# Patient Record
Sex: Female | Born: 1985 | Race: Black or African American | Hispanic: No | Marital: Single | Smoking: Never smoker
Health system: Southern US, Community
[De-identification: ages and names within clinical notes are randomized; demographics above are authoritative.]

## PROBLEM LIST (undated history)

## (undated) DIAGNOSIS — R7303 Prediabetes: Secondary | ICD-10-CM

## (undated) DIAGNOSIS — E282 Polycystic ovarian syndrome: Secondary | ICD-10-CM

## (undated) DIAGNOSIS — E119 Type 2 diabetes mellitus without complications: Secondary | ICD-10-CM

## (undated) DIAGNOSIS — O24419 Gestational diabetes mellitus in pregnancy, unspecified control: Secondary | ICD-10-CM

## (undated) DIAGNOSIS — N912 Amenorrhea, unspecified: Secondary | ICD-10-CM

## (undated) DIAGNOSIS — I10 Essential (primary) hypertension: Secondary | ICD-10-CM

## (undated) HISTORY — PX: WISDOM TOOTH EXTRACTION: SHX21

## (undated) HISTORY — DX: Prediabetes: R73.03

## (undated) HISTORY — DX: Polycystic ovarian syndrome: E28.2

---

## 2017-07-16 ENCOUNTER — Encounter (HOSPITAL_COMMUNITY): Payer: Self-pay | Admitting: Emergency Medicine

## 2017-07-16 ENCOUNTER — Ambulatory Visit (HOSPITAL_COMMUNITY)
Admission: EM | Admit: 2017-07-16 | Discharge: 2017-07-16 | Disposition: A | Discharge status: Home / Self Care | Payer: BLUE CROSS/BLUE SHIELD | Attending: Family Medicine | Admitting: Family Medicine

## 2017-07-16 DIAGNOSIS — J40 Bronchitis, not specified as acute or chronic: Secondary | ICD-10-CM | POA: Diagnosis not present

## 2017-07-16 DIAGNOSIS — J0140 Acute pansinusitis, unspecified: Secondary | ICD-10-CM

## 2017-07-16 HISTORY — DX: Essential (primary) hypertension: I10

## 2017-07-16 HISTORY — DX: Type 2 diabetes mellitus without complications: E11.9

## 2017-07-16 HISTORY — DX: Amenorrhea, unspecified: N91.2

## 2017-07-16 MED ORDER — BENZONATATE 100 MG PO CAPS: 100.0000 mg | ORAL_CAPSULE | Freq: Three times a day (TID) | ORAL | 0 refills | Status: DC | PRN

## 2017-07-16 MED ORDER — AMOXICILLIN-POT CLAVULANATE 875-125 MG PO TABS: 1.0000 | ORAL_TABLET | Freq: Two times a day (BID) | ORAL | 0 refills | Status: AC

## 2017-07-16 NOTE — ED Provider Notes (Signed)
[facility]    CSN: [id number] Arrival date & time: 07/16/17  1123     History   Chief Complaint Chief Complaint  Patient presents with  . URI    HPI Melissa Fleming is a 31 y.o. female.   31 year old female presents with nasal and chest congestion and cough for over 10 days. Also experiencing chills, sore throat and nausea but denies any fever, vomiting or diarrhea. Last evening, she was having more chest congestion and soreness and coughing up green mucus. Has taken Dayquil/Nyquil and Ibuprofen with minimal relief. No other family members ill. Has history of HTN and Diabetes- on Lisinopril, Aldactone and Metformin daily.    The history is provided by the patient.    Past Medical History:  Diagnosis Date  . Amenorrhea   . Diabetes mellitus without complication (HCC)   . Hypertension     There are no active problems to display for this patient.   History reviewed. No pertinent surgical history.  OB History    No data available       Home Medications    Prior to Admission medications   Medication Sig Start Date End Date Taking? Authorizing Provider  LISINOPRIL PO Take by mouth.   Yes [provider]  metFORMIN (GLUCOPHAGE) 500 MG tablet Take by mouth 2 (two) times daily with a meal.   Yes [provider]  spironolactone (ALDACTONE) 25 MG tablet Take 25 mg by mouth daily.   Yes [provider]  amoxicillin-clavulanate (AUGMENTIN) 875-125 MG tablet Take 1 tablet by mouth every 12 (twelve) hours. 07/16/17 07/23/17  Sudie Grumbling, NP  benzonatate (TESSALON) 100 MG capsule Take 1 capsule (100 mg total) by mouth 3 (three) times daily as needed for cough. 07/16/17   Sudie Grumbling, NP    Family History No family history on file.  Social History Social History  Substance Use Topics  . Smoking status: Never Smoker  . Smokeless tobacco: Not on file  . Alcohol use Yes     Allergies   Patient has no known  allergies.   Review of Systems Review of Systems  Constitutional: Positive for chills and fatigue. Negative for appetite change and fever.  HENT: Positive for congestion, postnasal drip, rhinorrhea, sinus pain, sinus pressure and sore throat. Negative for ear discharge, ear pain, mouth sores, nosebleeds, sneezing and trouble swallowing.   Eyes: Negative for discharge, redness and itching.  Respiratory: Positive for cough and chest tightness. Negative for shortness of breath and wheezing.   Gastrointestinal: Positive for nausea. Negative for abdominal pain, diarrhea and vomiting.  Musculoskeletal: Positive for arthralgias and myalgias. Negative for neck pain and neck stiffness.  Skin: Negative for rash and wound.  Allergic/Immunologic: Negative for immunocompromised state.  Neurological: Positive for headaches. Negative for dizziness, tremors, seizures, syncope, weakness, light-headedness and numbness.  Hematological: Negative for adenopathy. Does not bruise/bleed easily.     Physical Exam Triage Vital Signs ED Triage Vitals  Enc Vitals Group     BP 07/16/17 1139 (!) 141/83     Pulse Rate 07/16/17 1139 88     Resp 07/16/17 1139 16     Temp 07/16/17 1139 98.2 F (36.8 C)     Temp Source 07/16/17 1139 Oral     SpO2 07/16/17 1139 98 %     Weight 07/16/17 1140 200 lb (90.7 kg)     Height 07/16/17 1140  (1.626 m)     Head Circumference --  Peak Flow --      Pain Score 07/16/17 1143 6     Pain Loc --      Pain Edu? --      Excl. in GC? --    No data found.   Updated Vital Signs BP (!) 141/83 (BP Location: Right Arm)   Pulse 88   Temp 98.2 F (36.8 C) (Oral)   Resp 16   Ht  (1.626 m)   Wt 200 lb (90.7 kg)   SpO2 98%   BMI 34.33 kg/m   Visual Acuity Right Eye Distance:   Left Eye Distance:   Bilateral Distance:    Right Eye Near:   Left Eye Near:    Bilateral Near:     Physical Exam  Constitutional: She is oriented to person, place, and time. She  appears well-developed and well-nourished. She appears ill. No distress.  HENT:  Head: Normocephalic and atraumatic.  Right Ear: Hearing, tympanic membrane, external ear and ear canal normal.  Left Ear: Hearing, tympanic membrane, external ear and ear canal normal.  Nose: Mucosal edema and rhinorrhea present. Right sinus exhibits maxillary sinus tenderness and frontal sinus tenderness. Left sinus exhibits maxillary sinus tenderness and frontal sinus tenderness.  Mouth/Throat: Uvula is midline and mucous membranes are normal. Posterior oropharyngeal erythema present.  Neck: Normal range of motion. Neck supple.  Cardiovascular: Normal rate, regular rhythm and normal heart sounds.   No murmur heard. Pulmonary/Chest: Effort normal. No respiratory distress. She has decreased breath sounds in the right upper field and the left upper field. She has no wheezes. She has rhonchi in the right upper field and the left upper field.  Musculoskeletal: Normal range of motion.  Lymphadenopathy:    She has no cervical adenopathy.  Neurological: She is alert and oriented to person, place, and time.  Skin: Skin is warm and dry. No rash noted.  Psychiatric: She has a normal mood and affect. Her behavior is normal. Judgment and thought content normal.     UC Treatments / Results  Labs (all labs ordered are listed, but only abnormal results are displayed) Labs Reviewed - No data to display  EKG  EKG Interpretation None       Radiology No results found.  Procedures Procedures (including critical care time)  Medications Ordered in UC Medications - No data to display   Initial Impression / Assessment and Plan / UC Course  I have reviewed the triage vital signs and the nursing notes.  Pertinent labs & imaging results that were available during my care of the patient were reviewed by me and considered in my medical decision making (see chart for details).    Discussed with patient that she  probably has a sinus infection and mild bronchitis. Recommend start Augmentin  twice a day as directed- take with food. Recommend use Tessalon cough pills - take 1 every 8 hours as needed. Increase fluid intake to help loosen up mucus. May take Ibuprofen  every 6 hours as needed for pain. Note written for work. Rest. Follow-up in 4 to 5 days if not improving.    Final Clinical Impressions(s) / UC Diagnoses   Final diagnoses:  Acute non-recurrent pansinusitis  Bronchitis    New Prescriptions Discharge Medication List as of 07/16/2017 12:06 PM    START taking these medications   Details  amoxicillin-clavulanate (AUGMENTIN) 875-125 MG tablet Take 1 tablet by mouth every 12 (twelve) hours., Starting Thu 07/16/2017, Until Thu 07/23/2017, Normal    benzonatate (  TESSALON) 100 MG capsule Take 1 capsule (100 mg total) by mouth 3 (three) times daily as needed for cough., Starting Thu 07/16/2017, Normal         Controlled Substance Prescriptions [place] Controlled Substance Registry consulted? Not Applicable   Sudie Grumbling, NP 07/16/17 1644

## 2017-07-16 NOTE — Discharge Instructions (Addendum)
Recommend start Augmentin  twice a day as directed- take with food. Recommend use Tessalon cough pills - take 1 every 8 hours as needed. Increase fluid intake to help loosen up mucus. May take Ibuprofen  every 6 hours as needed for pain. Follow-up in 4 to 5 days if not improving.

## 2017-07-16 NOTE — ED Triage Notes (Signed)
Cold symptoms for a week and a half.  Head congestion, chest congestion has worsened and chest soreness worsened last night .  Generalized aches.  Reports intermittent thick green mucus

## 2017-11-01 ENCOUNTER — Other Ambulatory Visit: Payer: Self-pay

## 2017-11-01 ENCOUNTER — Encounter (HOSPITAL_COMMUNITY): Payer: Self-pay | Admitting: *Deleted

## 2017-11-01 ENCOUNTER — Ambulatory Visit (HOSPITAL_COMMUNITY)
Admission: EM | Admit: 2017-11-01 | Discharge: 2017-11-01 | Disposition: A | Discharge status: Home / Self Care | Payer: BLUE CROSS/BLUE SHIELD | Attending: Family Medicine | Admitting: Family Medicine

## 2017-11-01 DIAGNOSIS — J22 Unspecified acute lower respiratory infection: Secondary | ICD-10-CM

## 2017-11-01 MED ORDER — RANITIDINE HCL 150 MG PO CAPS: 150.0000 mg | ORAL_CAPSULE | Freq: Every day | ORAL | 0 refills | Status: DC

## 2017-11-01 MED ORDER — AZITHROMYCIN 250 MG PO TABS: ORAL_TABLET | ORAL | 0 refills | Status: DC

## 2017-11-01 MED ORDER — ACETAMINOPHEN 325 MG PO TABS
ORAL_TABLET | ORAL | Status: AC
  Filled 2017-11-01: qty 2

## 2017-11-01 MED ORDER — ACETAMINOPHEN 325 MG PO TABS
650.0000 mg | ORAL_TABLET | Freq: Once | ORAL | Status: AC
  Administered 2017-11-01: 650 mg via ORAL

## 2017-11-01 MED ORDER — PREDNISONE 20 MG PO TABS: 40.0000 mg | ORAL_TABLET | Freq: Every day | ORAL | 0 refills | Status: AC

## 2017-11-01 NOTE — ED Provider Notes (Signed)
[facility]    CSN: [id number] Arrival date & time: 11/01/17  1702     History   Chief Complaint Chief Complaint  Patient presents with  . Cough    HPI Melissa Fleming is a 32 y.o. female.   [name] presents with complaints of cough for the past week which worsened over the past two days. Hoarse voice. Body aches, headache, mildly short of breath. Burning to the chest which is worse with eating. Productive cough. Sore throat. Temps 99 range. Without ear pain. Denies gi/gu complaints. 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 Taking cough/congestion symptoms, dayquil and nyquil which have not helped. Without asthma history, does not smoke. History of diabetes type 2, hypertension.    ROS per HPI.       Past Medical History:  Diagnosis Date  . Amenorrhea   . Diabetes mellitus without complication (HCC)   . Hypertension     There are no active problems to display for this patient.   History reviewed. No pertinent surgical history.  OB History    No data available       Home Medications    Prior to Admission medications   Medication Sig Start Date End Date Taking? Authorizing Provider  LISINOPRIL PO Take by mouth.   Yes [provider]  metFORMIN (GLUCOPHAGE) 500 MG tablet Take by mouth 2 (two) times daily with a meal.   Yes [provider]  spironolactone (ALDACTONE) 25 MG tablet Take 25 mg by mouth daily.   Yes [provider]  azithromycin (ZITHROMAX) 250 MG tablet Take first 2 tablets together, then 1 every day until finished. 11/01/17   Georgetta Haber, NP  benzonatate (TESSALON) 100 MG capsule Take 1 capsule (100 mg total) by mouth 3 (three) times daily as needed for cough. 07/16/17   Sudie Grumbling, NP  predniSONE (DELTASONE) 20 MG tablet Take 2 tablets (40 mg total) by mouth daily with breakfast for 5 days. 11/01/17 11/06/17  Georgetta Haber, NP  ranitidine (ZANTAC) 150 MG capsule Take 1 capsule (150 mg total)  by mouth daily. 11/01/17   Georgetta Haber, NP    Family History No family history on file.  Social History Social History   Tobacco Use  . Smoking status: Never Smoker  . Smokeless tobacco: Never Used  Substance Use Topics  . Alcohol use: Yes    Comment: occasionally  . Drug use: No     Allergies   Patient has no known allergies.   Review of Systems Review of Systems   Physical Exam Triage Vital Signs ED Triage Vitals  Enc Vitals Group     BP 11/01/17 1755 (!) 149/103     Pulse Rate 11/01/17 1755 82     Resp 11/01/17 1755 20     Temp 11/01/17 1755 98.3 F (36.8 C)     Temp Source 11/01/17 1755 Oral     SpO2 11/01/17 1755 99 %     Weight --      Height --      Head Circumference --      Peak Flow --      Pain Score 11/01/17 1757 8     Pain Loc --      Pain Edu? --      Excl. in GC? --    No data found.  Updated Vital Signs BP (!) 149/103   Pulse 82   Temp 98.3 F (36.8 C) (Oral)  Resp 20   SpO2 99%   Visual Acuity Right Eye Distance:   Left Eye Distance:   Bilateral Distance:    Right Eye Near:   Left Eye Near:    Bilateral Near:     Physical Exam  Constitutional: She is oriented to person, place, and time. She appears well-developed and well-nourished. No distress.  HENT:  Head: Normocephalic and atraumatic.  Right Ear: Tympanic membrane, external ear and ear canal normal.  Left Ear: Tympanic membrane, external ear and ear canal normal.  Nose: Nose normal.  Mouth/Throat: Uvula is midline, oropharynx is clear and moist and mucous membranes are normal. No tonsillar exudate.  Eyes: Conjunctivae and EOM are normal. Pupils are equal, round, and reactive to light.  Cardiovascular: Normal rate, regular rhythm and normal heart sounds.  Pulmonary/Chest: Effort normal. She has decreased breath sounds.  Abdominal: Soft. There is no tenderness.  Neurological: She is alert and oriented to person, place, and time.  Skin: Skin is warm and dry.      UC Treatments / Results  Labs (all labs ordered are listed, but only abnormal results are displayed) Labs Reviewed - No data to display  EKG  EKG Interpretation None       Radiology No results found.  Procedures Procedures (including critical care time)  Medications Ordered in UC Medications  acetaminophen (TYLENOL) tablet 650 mg (650 mg Oral Given 11/01/17 1803)     Initial Impression / Assessment and Plan / UC Course  I have reviewed the triage vital signs and the nursing notes.  Pertinent labs & imaging results that were available during my care of the patient were reviewed by me and considered in my medical decision making (see chart for details).     1 week of symptoms, worsening cough. zpack initiated. Imaging deferred at this time. Afebrile, without tachypnea, tachycardia or hypoxia. Complete course of antibiotics. 5 days of 50mg  prednisone. Zantac for burning with eating. If symptoms worsen or do not improve in the next week to return to be seen or to follow up with PCP.  Patient verbalized understanding and agreeable to plan.    Final Clinical Impressions(s) / UC Diagnoses   Final diagnoses:  Lower respiratory infection    ED Discharge Orders        Ordered    azithromycin (ZITHROMAX) 250 MG tablet     11/01/17 1827    predniSONE (DELTASONE) 20 MG tablet  Daily with breakfast     11/01/17 1827    ranitidine (ZANTAC) 150 MG capsule  Daily     11/01/17 1827       Controlled Substance Prescriptions [place] Controlled Substance Registry consulted? Not Applicable   Georgetta HaberBurky, Shatavia Santor B, NP 11/01/17 [phone]

## 2017-11-01 NOTE — Discharge Instructions (Signed)
Push fluids to ensure adequate hydration and keep secretions thin.  5 days of prednisone, please monitor your blood sugars.  Daily zantac to help with heartburn.  Tylenol and/or ibuprofen as needed for pain or fevers.  If symptoms worsen or do not improve in the next week to return to be seen or to follow up with PCP.

## 2017-11-01 NOTE — ED Triage Notes (Signed)
C/O cough x 1 wk.  Now having generalized body aches, feeling feverish with temps in 99 range.

## 2017-11-03 ENCOUNTER — Ambulatory Visit (HOSPITAL_COMMUNITY)
Admission: EM | Admit: 2017-11-03 | Discharge: 2017-11-03 | Disposition: A | Discharge status: Home / Self Care | Payer: BLUE CROSS/BLUE SHIELD | Attending: Family Medicine | Admitting: Family Medicine

## 2017-11-03 ENCOUNTER — Ambulatory Visit (INDEPENDENT_AMBULATORY_CARE_PROVIDER_SITE_OTHER): Payer: BLUE CROSS/BLUE SHIELD

## 2017-11-03 ENCOUNTER — Encounter (HOSPITAL_COMMUNITY): Payer: Self-pay | Admitting: Emergency Medicine

## 2017-11-03 DIAGNOSIS — Z3202 Encounter for pregnancy test, result negative: Secondary | ICD-10-CM | POA: Diagnosis not present

## 2017-11-03 DIAGNOSIS — J22 Unspecified acute lower respiratory infection: Secondary | ICD-10-CM

## 2017-11-03 DIAGNOSIS — R05 Cough: Secondary | ICD-10-CM | POA: Diagnosis not present

## 2017-11-03 DIAGNOSIS — R109 Unspecified abdominal pain: Secondary | ICD-10-CM | POA: Diagnosis not present

## 2017-11-03 LAB — POCT URINALYSIS DIP (DEVICE)
Bilirubin Urine: NEGATIVE
Glucose, UA: NEGATIVE mg/dL
HGB URINE DIPSTICK: NEGATIVE
Ketones, ur: NEGATIVE mg/dL
LEUKOCYTES UA: NEGATIVE
NITRITE: NEGATIVE
PROTEIN: NEGATIVE mg/dL
Specific Gravity, Urine: 1.015 (ref 1.005–1.030)
UROBILINOGEN UA: 0.2 mg/dL (ref 0.0–1.0)
pH: 7.5 (ref 5.0–8.0)

## 2017-11-03 LAB — POCT PREGNANCY, URINE: Preg Test, Ur: NEGATIVE

## 2017-11-03 MED ORDER — SALINE SPRAY 0.65 % NA SOLN: 1.0000 | NASAL | 0 refills | Status: DC | PRN

## 2017-11-03 NOTE — Discharge Instructions (Signed)
Your chest xray looks normal today. Complete course of antibiotics previously prescribed. Complete course of steroids. May use nasal saline multiple times a day to promote moisture to nares to prevent bleed. Mucinex as an expectorant. Push fluids to ensure adequate hydration and keep secretions thin.  Tylenol and/or ibuprofen as needed for pain or fevers.  If symptoms worsen or do not improve in the next week to return to be seen or to follow up with your PCP.

## 2017-11-03 NOTE — ED Provider Notes (Signed)
[facility]    CSN: [id number] Arrival date & time: 11/03/17  1620     History   Chief Complaint Chief Complaint  Patient presents with  . URI    HPI Shahrzad Koble is a 32 y.o. female.   Angella presents with complaints of worsening of symptoms. States originally had mild URI symptoms and itching throat for approximately 1 week, which worsened on 1/26. Was seen here 1/27, started on zpack and prednisone. States she feels worse in that she has intermittent abdominal cramping, three episodes of diarrhea today, cough is now productive of green sputum, headache, shortness of breath, and thirsty. Sore throat. 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 Has been taking prescribed medications as well as robitussin dayquil and nyquil. Has had a few nose bleeds as well. She is urinating normally for her. Without vomiting. History of diabetes and hypertension.       Past Medical History:  Diagnosis Date  . Amenorrhea   . Diabetes mellitus without complication (HCC)   . Hypertension     There are no active problems to display for this patient.   History reviewed. No pertinent surgical history.  OB History    No data available       Home Medications    Prior to Admission medications   Medication Sig Start Date End Date Taking? Authorizing Provider  azithromycin (ZITHROMAX) 250 MG tablet Take first 2 tablets together, then 1 every day until finished. 11/01/17   Georgetta Haber, NP  benzonatate (TESSALON) 100 MG capsule Take 1 capsule (100 mg total) by mouth 3 (three) times daily as needed for cough. 07/16/17   Sudie Grumbling, NP  LISINOPRIL PO Take by mouth.    [provider]  metFORMIN (GLUCOPHAGE) 500 MG tablet Take by mouth 2 (two) times daily with a meal.    [provider]  predniSONE (DELTASONE) 20 MG tablet Take 2 tablets (40 mg total) by mouth daily with breakfast for 5 days. 11/01/17 11/06/17  Georgetta Haber, NP  ranitidine (ZANTAC) 150 MG capsule Take 1 capsule (150 mg total) by mouth daily. 11/01/17   Georgetta Haber, NP  sodium chloride (OCEAN) 0.65 % SOLN nasal spray Place 1 spray into both nostrils as needed for congestion. 11/03/17   Georgetta Haber, NP  spironolactone (ALDACTONE) 25 MG tablet Take 25 mg by mouth daily.    [provider]    Family History No family history on file.  Social History Social History   Tobacco Use  . Smoking status: Never Smoker  . Smokeless tobacco: Never Used  Substance Use Topics  . Alcohol use: Yes    Comment: occasionally  . Drug use: No     Allergies   Patient has no known allergies.   Review of Systems Review of Systems   Physical Exam Triage Vital Signs ED Triage Vitals  Enc Vitals Group     BP 11/03/17 1712 (!) 151/110     Pulse Rate 11/03/17 1712 95     Resp 11/03/17 1712 16     Temp 11/03/17 1712 98.5 F (36.9 C)     Temp Source 11/03/17 1712 Temporal     SpO2 11/03/17 1712 100 %     Weight 11/03/17 1711 200 lb (90.7 kg)     Height --      Head Circumference --      Peak Flow --  Pain Score 11/03/17 1711 8     Pain Loc --      Pain Edu? --      Excl. in GC? --    No data found.  Updated Vital Signs BP (!) 151/110   Pulse 95   Temp 98.5 F (36.9 C) (Temporal)   Resp 16   Wt 200 lb (90.7 kg)   SpO2 100%   BMI 34.33 kg/m   Visual Acuity Right Eye Distance:   Left Eye Distance:   Bilateral Distance:    Right Eye Near:   Left Eye Near:    Bilateral Near:     Physical Exam  Constitutional: She is oriented to person, place, and time. She appears well-developed and well-nourished. No distress.  HENT:  Head: Normocephalic and atraumatic.  Right Ear: Tympanic membrane, external ear and ear canal normal.  Left Ear: Tympanic membrane, external ear and ear canal normal.  Nose: Nose normal.  Mouth/Throat: Uvula is midline and mucous membranes are normal. Posterior oropharyngeal erythema present.  Tonsils are 2+ on the right. No tonsillar exudate.  Eyes: Conjunctivae and EOM are normal. Pupils are equal, round, and reactive to light.  Cardiovascular: Normal rate, regular rhythm and normal heart sounds.  Pulmonary/Chest: Effort normal and breath sounds normal.  Strong congested cough noted  Abdominal: Soft. There is no tenderness.  Neurological: She is alert and oriented to person, place, and time.  Skin: Skin is warm and dry.     UC Treatments / Results  Labs (all labs ordered are listed, but only abnormal results are displayed) Labs Reviewed  POCT URINALYSIS DIP (DEVICE)  POCT PREGNANCY, URINE    EKG  EKG Interpretation None       Radiology Dg Chest 2 View  Result Date: 11/03/2017 CLINICAL DATA:  Patient states that she's had cough and congestion x 1 week, along with chest tightness and fatigue. Hx of htn and diabetes. Non-smoker EXAM: CHEST  2 VIEW COMPARISON:  None. FINDINGS: The heart size and mediastinal contours are within normal limits. Both lungs are clear. No pleural effusion or pneumothorax. The visualized skeletal structures are unremarkable. IMPRESSION: Normal chest radiographs. Electronically Signed   By: Amie Portlandavid  Ormond M.D.   On: 11/03/2017 18:22    Procedures Procedures (including critical care time)  Medications Ordered in UC Medications - No data to display   Initial Impression / Assessment and Plan / UC Course  I have reviewed the triage vital signs and the nursing notes.  Pertinent labs & imaging results that were available during my care of the patient were reviewed by me and considered in my medical decision making (see chart for details).     Patient with persistent lower respiratory tract infection symptoms. Productive cough. Without fevers. Still on azithromycin and steroids. Negative cxr. Vitals stable. Patient non toxic in appearance although ill appearing. Discussed influenza vs bacterial infection. Complete medications previously  prescribed. Continue with supportive cares. Return precautions provided. Patient verbalized understanding and agreeable to plan.    Final Clinical Impressions(s) / UC Diagnoses   Final diagnoses:  Lower respiratory tract infection    ED Discharge Orders        Ordered    sodium chloride (OCEAN) 0.65 % SOLN nasal spray  As needed     11/03/17 1840       Controlled Substance Prescriptions  Controlled Substance Registry consulted? Not Applicable   Georgetta HaberBurky, Natalie B, NP 11/03/17 1845

## 2017-11-03 NOTE — ED Triage Notes (Signed)
PT was seen here 2 days ago for URI. PT reports she is not improving.

## 2018-01-08 ENCOUNTER — Emergency Department (HOSPITAL_COMMUNITY)
Admission: EM | Admit: 2018-01-08 | Discharge: 2018-01-09 | Disposition: A | Discharge status: Home / Self Care | Payer: Self-pay | Attending: Emergency Medicine | Admitting: Emergency Medicine

## 2018-01-08 ENCOUNTER — Emergency Department (HOSPITAL_COMMUNITY): Payer: Self-pay

## 2018-01-08 ENCOUNTER — Other Ambulatory Visit: Payer: Self-pay

## 2018-01-08 ENCOUNTER — Encounter (HOSPITAL_COMMUNITY): Payer: Self-pay

## 2018-01-08 DIAGNOSIS — R69 Illness, unspecified: Secondary | ICD-10-CM

## 2018-01-08 DIAGNOSIS — Z79899 Other long term (current) drug therapy: Secondary | ICD-10-CM | POA: Insufficient documentation

## 2018-01-08 DIAGNOSIS — J111 Influenza due to unidentified influenza virus with other respiratory manifestations: Secondary | ICD-10-CM | POA: Insufficient documentation

## 2018-01-08 DIAGNOSIS — I10 Essential (primary) hypertension: Secondary | ICD-10-CM | POA: Insufficient documentation

## 2018-01-08 DIAGNOSIS — E119 Type 2 diabetes mellitus without complications: Secondary | ICD-10-CM | POA: Insufficient documentation

## 2018-01-08 DIAGNOSIS — Z7984 Long term (current) use of oral hypoglycemic drugs: Secondary | ICD-10-CM | POA: Insufficient documentation

## 2018-01-08 LAB — CBC
HEMATOCRIT: 39.6 % (ref 36.0–46.0)
Hemoglobin: 13.2 g/dL (ref 12.0–15.0)
MCH: 28.7 pg (ref 26.0–34.0)
MCHC: 33.3 g/dL (ref 30.0–36.0)
MCV: 86.1 fL (ref 78.0–100.0)
PLATELETS: 325 10*3/uL (ref 150–400)
RBC: 4.6 MIL/uL (ref 3.87–5.11)
RDW: 13.9 % (ref 11.5–15.5)
WBC: 8.1 10*3/uL (ref 4.0–10.5)

## 2018-01-08 LAB — URINALYSIS, ROUTINE W REFLEX MICROSCOPIC
BILIRUBIN URINE: NEGATIVE
Glucose, UA: NEGATIVE mg/dL
Hgb urine dipstick: NEGATIVE
KETONES UR: NEGATIVE mg/dL
Leukocytes, UA: NEGATIVE
Nitrite: NEGATIVE
PH: 7 (ref 5.0–8.0)
Protein, ur: NEGATIVE mg/dL
Specific Gravity, Urine: 1.013 (ref 1.005–1.030)

## 2018-01-08 LAB — I-STAT CHEM 8, ED
BUN: 6 mg/dL (ref 6–20)
CREATININE: 0.5 mg/dL (ref 0.44–1.00)
Calcium, Ion: 1.05 mmol/L — ABNORMAL LOW (ref 1.15–1.40)
Chloride: 105 mmol/L (ref 101–111)
Glucose, Bld: 88 mg/dL (ref 65–99)
HCT: 36 % (ref 36.0–46.0)
HEMOGLOBIN: 12.2 g/dL (ref 12.0–15.0)
Potassium: 3.5 mmol/L (ref 3.5–5.1)
Sodium: 141 mmol/L (ref 135–145)
TCO2: 23 mmol/L (ref 22–32)

## 2018-01-08 LAB — BASIC METABOLIC PANEL
Anion gap: 9 (ref 5–15)
BUN: 11 mg/dL (ref 6–20)
CALCIUM: 8.7 mg/dL — AB (ref 8.9–10.3)
CO2: 21 mmol/L — ABNORMAL LOW (ref 22–32)
CREATININE: 0.7 mg/dL (ref 0.44–1.00)
Chloride: 103 mmol/L (ref 101–111)
GFR calc Af Amer: >60 mL/min (ref >60)
GLUCOSE: 91 mg/dL (ref 65–99)
Potassium: 5.8 mmol/L — ABNORMAL HIGH (ref 3.5–5.1)
Sodium: 133 mmol/L — ABNORMAL LOW (ref 135–145)

## 2018-01-08 LAB — PREGNANCY, URINE: PREG TEST UR: NEGATIVE

## 2018-01-08 MED ORDER — ONDANSETRON HCL 4 MG/2ML IJ SOLN
4.0000 mg | Freq: Once | INTRAMUSCULAR | Status: AC
  Administered 2018-01-08: 4 mg via INTRAVENOUS
  Filled 2018-01-08: qty 2

## 2018-01-08 MED ORDER — SODIUM CHLORIDE 0.9 % IV BOLUS
1000.0000 mL | Freq: Once | INTRAVENOUS | Status: AC
  Administered 2018-01-08: 1000 mL via INTRAVENOUS

## 2018-01-08 MED ORDER — GUAIFENESIN 100 MG/5ML PO SOLN
10.0000 mL | Freq: Once | ORAL | Status: AC
  Administered 2018-01-08: 200 mg via ORAL
  Filled 2018-01-08: qty 20

## 2018-01-08 MED ORDER — OSELTAMIVIR PHOSPHATE 75 MG PO CAPS: 75.0000 mg | ORAL_CAPSULE | Freq: Two times a day (BID) | ORAL | 0 refills | Status: DC

## 2018-01-08 MED ORDER — ONDANSETRON 4 MG PO TBDP: ORAL_TABLET | ORAL | 0 refills | Status: DC

## 2018-01-08 MED ORDER — ACETAMINOPHEN 325 MG PO TABS
650.0000 mg | ORAL_TABLET | Freq: Once | ORAL | Status: AC | PRN
  Administered 2018-01-08: 650 mg via ORAL
  Filled 2018-01-08: qty 2

## 2018-01-08 MED ORDER — KETOROLAC TROMETHAMINE 30 MG/ML IJ SOLN
30.0000 mg | Freq: Once | INTRAMUSCULAR | Status: AC
  Administered 2018-01-08: 30 mg via INTRAVENOUS
  Filled 2018-01-08: qty 1

## 2018-01-08 NOTE — Discharge Instructions (Signed)
You have the flu this is a viral infection that will likely start to improve after 5-7 days, antibiotics are not helpful in treating viral infections.  You may take Tamiflu twice a day for the next 5 days this will help shorten the duration of the flu and prevent complications, this medication can cause some upset stomach, nausea and diarrhea. You may use Zofran as needed for nausea. Please make sure you are drinking plenty of fluids. You can treat your symptoms supportively with tylenol/ibuprofen for fevers and pains, Zyrtec and Flonase to heal with nasal congestion, and over the counter cough syrups and throat lozenges to help with cough. If your symptoms are not improving please follow up with you Primary doctor.  ° °If you develop persistent fevers, shortness of breath or difficulty breathing, chest pain, severe headache and neck pain, persistent nausea and vomiting or other new or concerning symptoms return to the Emergency department. °

## 2018-01-08 NOTE — ED Notes (Signed)
Requested urine from patient. Patient does not have to urinate at this time. 

## 2018-01-08 NOTE — ED Notes (Signed)
Will obtain EKG once pt returns from x-ray.

## 2018-01-08 NOTE — ED Provider Notes (Signed)
[facility]-EMERGENCY DEPT Provider Note   CSN: [id number] Arrival date & time: 01/08/18  2034     History   Chief Complaint Chief Complaint  Patient presents with  . Cough  . Dizziness    HPI Melissa Fleming is a 32 y.o. female.  Melissa Fleming is a 32 y.o. Female with a history of PCO S, presents to the ED for evaluation of 2 days of cough, fevers, chills, body aches, rhinorrhea and nasal congestion.  Since onset symptoms have been constant and worsening.  Patient tried 1 dose of ibuprofen with minimal improvement has not tried anything else to treat her symptoms.  Patient reports since onset of symptoms last night she primarily with position changes today at work she had to sit down several times.  Patient associated pain and soreness from coughing but denies constant chest pain, pleuritic pain or shortness of breath.  Patient reports some mild nausea one episode of posttussive emesis, denies any abdominal pain or diarrhea.  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     Past Medical History:  Diagnosis Date  . Amenorrhea   . Diabetes mellitus without complication (HCC)   . Hypertension     There are no active problems to display for this patient.   History reviewed. No pertinent surgical history.   OB History   None      Home Medications    Prior to Admission medications   Medication Sig Start Date End Date Taking? Authorizing Provider  acetaminophen (TYLENOL) 500 MG tablet Take 500 mg by mouth every 6 (six) hours as needed for moderate pain.   Yes [provider]  Chlorpheniramine Maleate (ALLERGY PO) Take 1 tablet by mouth daily as needed (allergies).   Yes [provider]  diphenhydrAMINE (BENADRYL) 25 MG tablet Take 25 mg by mouth every 6 (six) hours as needed for itching or allergies.   Yes [provider]    lisinopril (PRINIVIL,ZESTRIL) 10 MG tablet Take 10 mg by mouth daily.   Yes [provider]  metFORMIN (GLUCOPHAGE) 500 MG tablet Take 500 mg by mouth at bedtime.    Yes [provider]  sodium chloride (OCEAN) 0.65 % SOLN nasal spray Place 1 spray into both nostrils as needed for congestion. 11/03/17  Yes Linus MakoBurky, Natalie B, NP  spironolactone (ALDACTONE) 25 MG tablet Take 25 mg by mouth daily.   Yes [provider]  azithromycin (ZITHROMAX) 250 MG tablet Take first 2 tablets together, then 1 every day until finished. Patient not taking: Reported on 01/08/2018 11/01/17   Georgetta HaberBurky, Natalie B, NP  benzonatate (TESSALON) 100 MG capsule Take 1 capsule (100 mg total) by mouth 3 (three) times daily as needed for cough. Patient not taking: Reported on 01/08/2018 07/16/17   Sudie GrumblingAmyot, Ann Berry, NP  ranitidine (ZANTAC) 150 MG capsule Take 1 capsule (150 mg total) by mouth daily. Patient not taking: Reported on 01/08/2018 11/01/17   Georgetta HaberBurky, Natalie B, NP    Family History History reviewed. No pertinent family history.  Social History Social History   Tobacco Use  . Smoking status: Never Smoker  . Smokeless tobacco: Never Used  Substance Use Topics  . Alcohol use: Yes    Comment: occasionally  . Drug use: No     Allergies   Patient has no known allergies.   Review of Systems Review of Systems  Constitutional: Positive for chills and fever.  HENT: Positive for congestion, postnasal drip, rhinorrhea and sore throat. Negative for ear discharge and ear pain.   Eyes: Negative for discharge, redness and itching.  Respiratory: Positive for cough. Negative for shortness of breath.   Cardiovascular: Negative for chest pain and leg swelling.  Gastrointestinal: Positive for nausea. Negative for abdominal pain, diarrhea and vomiting.  Genitourinary: Negative for dysuria and frequency.  Musculoskeletal: Positive for arthralgias and myalgias.  Skin: Negative for color change and rash.   Neurological: Positive for light-headedness. Negative for dizziness and headaches.     Physical Exam Updated Vital Signs BP (!) 149/89 (BP Location: Right Arm)   Pulse 95   Temp (!) 100.6 F (38.1 C) (Oral)   Resp 12   Ht 5' (1.524 m)   Wt 93.9 kg (207 lb)   SpO2 96%   BMI 40.43 kg/m   Physical Exam  Constitutional: She appears well-developed and well-nourished. No distress.  Patient appears mildly ill but is in no acute distress.  HENT:  Head: Normocephalic and atraumatic.  TMs clear with good landmarks, moderate nasal mucosa edema with clear rhinorrhea, posterior oropharynx clear and moist, with some erythema, no edema or exudates, uvula midline  Eyes: Right eye exhibits no discharge. Left eye exhibits no discharge.  Neck: Neck supple.  No rigidity  Cardiovascular: Normal rate, regular rhythm, normal heart sounds and intact distal pulses.  Pulmonary/Chest: Effort normal and breath sounds normal. No stridor. No respiratory distress. She has no wheezes. She has no rales.  Respirations equal and unlabored, patient able to speak in full sentences, lungs clear to auscultation bilaterally  Abdominal: Soft. Bowel sounds are normal. She exhibits no distension and no mass. There is no tenderness. There is no guarding.  Lymphadenopathy:    She has no cervical adenopathy.  Neurological: She is alert. Coordination normal.  Skin: Skin is warm and dry. Capillary refill takes less than 2 seconds. She is not diaphoretic.  Psychiatric: She has a normal mood and affect. Her behavior is normal.  Nursing note and vitals reviewed.    ED Treatments / Results  Labs (all labs ordered are listed, but only abnormal results are displayed) Labs Reviewed  BASIC METABOLIC PANEL - Abnormal; Notable for the following components:      Result Value   Sodium 133 (*)    Potassium 5.8 (*)    CO2 21 (*)    Calcium 8.7 (*)    All other components within normal limits  I-STAT CHEM 8, ED - Abnormal;  Notable for the following components:   Calcium, Ion 1.05 (*)    All other components within normal limits  PREGNANCY, URINE  CBC  URINALYSIS, ROUTINE W REFLEX MICROSCOPIC    EKG EKG Interpretation  Date/Time:  Friday January 08 2018 22:04:22 EDT Ventricular Rate:  104 PR Interval:    QRS Duration: 76 QT Interval:  315 QTC Calculation: 415 R Axis:   36 Text Interpretation:  Sinus tachycardia No old tracing to compare Confirmed by Rolan Bucco [phone]) on 01/08/2018 10:21:13 PM   Radiology Dg Chest 2 View  Result Date: 01/08/2018 CLINICAL DATA:  Pt reports dizziness, cough, and headache that started yesterday, but worsened today. Tachycardic in triage - fever - diabetic - never a smoker EXAM: CHEST - 2 VIEW COMPARISON:  11/03/2017 FINDINGS: The heart size and mediastinal contours are within normal limits. Both lungs are clear. No pleural effusion or pneumothorax. The visualized skeletal structures are unremarkable. IMPRESSION: Normal chest  radiographs. Electronically Signed   By: Amie Portland M.D.   On: 01/08/2018 22:00    Procedures Procedures (including critical care time)  Medications Ordered in ED Medications  acetaminophen (TYLENOL) tablet 650 mg (650 mg Oral Given 01/08/18 2102)  sodium chloride 0.9 % bolus 1,000 mL (0 mLs Intravenous Stopped 01/08/18 2325)  sodium chloride 0.9 % bolus 1,000 mL (0 mLs Intravenous Stopped 01/09/18 0002)  ketorolac (TORADOL) 30 MG/ML injection 30 mg (30 mg Intravenous Given 01/08/18 2210)  ondansetron (ZOFRAN) injection 4 mg (4 mg Intravenous Given 01/08/18 2210)  guaiFENesin (ROBITUSSIN) 100 MG/5ML solution 200 mg (200 mg Oral Given 01/08/18 2208)     Initial Impression / Assessment and Plan / ED Course  I have reviewed the triage vital signs and the nursing notes.  Pertinent labs & imaging results that were available during my care of the patient were reviewed by me and considered in my medical decision making (see chart for details).  Pt presents  to the ED for evaluation of 2 days of flu like symptoms, with fever, cough, rhinorrhea and body aches. On arrival pt is tachycardic to 130, and febrile at 102, mildly hypertensive, in NAD. Lungs are clear, HR tachy but regular rhythm, abdomen benign. Exam not concerning for meningitis, no evidence of otitis. Pt does appear dehydrated. Symptoms suggestive of influenza, given significant tachycardia, will get basic labs, UA and CXR. Fluids, toradol, zofran and guaifenesin,   Lab evaluation reassuring, no leukocytosis, normal Hgb, BMP shows K+ 5.8 with moderate hemolysis, repeat chem 8, shows normal K+, as expected , no other electrolyte derangements, UA w/o signs of infection. CXR shows no pneumonia. EKG shows sinus tach.  On reeval vitals improved and pt reports she is feeling better, pt ambulatory w/o lightheadedness.  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  Patient to follow-up with primary care.  Strict return precautions discussed.  Patient expressed understanding and is in agreement with plan.  Vitals:   01/08/18 2047 01/08/18 2210 01/08/18 2213 01/08/18 2307  BP: (!) 158/105  (!) 146/81 (!) 149/89  Pulse: (!) 130  (!) 111 95  Resp: 20  13 12   Temp: (!) 102 F (38.9 C)  (!) 102.2 F (39 C) (!) 100.6 F (38.1 C)  TempSrc: Oral  Oral Oral  SpO2: 98%  100% 96%  Weight:  93.9 kg (207 lb)    Height:  5' (1.524 m)       Final Clinical Impressions(s) / ED Diagnoses   Final diagnoses:  Influenza-like illness    ED Discharge Orders        Ordered    oseltamivir (TAMIFLU) 75 MG capsule  Every 12 hours     01/08/18 2346    ondansetron (ZOFRAN ODT) 4 MG disintegrating tablet     01/08/18 2346       Jodi Geralds [place], [place] 01/09/18 0215    Rolan Bucco, MD 01/10/18 [id number]

## 2018-01-08 NOTE — ED Triage Notes (Signed)
Pt reports dizziness, cough, and headache that started yesterday, but worsened today. Tachycardic in triage.

## 2018-01-15 ENCOUNTER — Emergency Department (HOSPITAL_COMMUNITY)
Admission: EM | Admit: 2018-01-15 | Discharge: 2018-01-15 | Disposition: A | Discharge status: Home / Self Care | Payer: Self-pay | Attending: Emergency Medicine | Admitting: Emergency Medicine

## 2018-01-15 ENCOUNTER — Encounter (HOSPITAL_COMMUNITY): Payer: Self-pay | Admitting: Emergency Medicine

## 2018-01-15 DIAGNOSIS — I10 Essential (primary) hypertension: Secondary | ICD-10-CM | POA: Insufficient documentation

## 2018-01-15 DIAGNOSIS — Z79899 Other long term (current) drug therapy: Secondary | ICD-10-CM | POA: Insufficient documentation

## 2018-01-15 DIAGNOSIS — Z7984 Long term (current) use of oral hypoglycemic drugs: Secondary | ICD-10-CM | POA: Insufficient documentation

## 2018-01-15 DIAGNOSIS — R059 Cough, unspecified: Secondary | ICD-10-CM

## 2018-01-15 DIAGNOSIS — R05 Cough: Secondary | ICD-10-CM | POA: Insufficient documentation

## 2018-01-15 DIAGNOSIS — E119 Type 2 diabetes mellitus without complications: Secondary | ICD-10-CM | POA: Insufficient documentation

## 2018-01-15 MED ORDER — BENZONATATE 100 MG PO CAPS: 100.0000 mg | ORAL_CAPSULE | Freq: Three times a day (TID) | ORAL | 0 refills | Status: DC

## 2018-01-15 MED ORDER — PROMETHAZINE-DM 6.25-15 MG/5ML PO SYRP: 5.0000 mL | ORAL_SOLUTION | Freq: Four times a day (QID) | ORAL | 0 refills | Status: DC | PRN

## 2018-01-15 NOTE — ED Provider Notes (Signed)
[facility]-EMERGENCY DEPT Provider Note   CSN: [id number] Arrival date & time: 01/15/18  1333     History   Chief Complaint Chief Complaint  Patient presents with  . Cough    HPI Mellody DanceShakita Harp is a 32 y.o. female with history of diabetes and hypertension presents today for evaluation of acute onset, persisting cough for 9 days.  She was seen and evaluated 1 week ago with flulike symptoms which included fever, myalgias, nasal congestion, and cough.  She was febrile at that time and chest x-ray was clear.  She was prescribed Tamiflu and Zofran with improvement in all of her symptoms except for her cough.  Patient states that her only residual symptom is a cough which is intermittently productive of yellow sputum.  She states that yesterday she noted a small amount of "red flecks"in the sputum but denies any gross hemoptysis.  She denies any fevers and states she has not had one in several days.  She denies shortness of breath but states that she experiences aching pain anteriorly and posteriorly to the chest wall when she coughs.  No aggravating or alleviating factors noted although she does note that the cough is worse at night.  She has tried Mucinex without significant relief of her symptoms.  She is a non-smoker.  No recent travel or surgeries, no prior history of DVT or PE, she is not on OCPs.  She does work at a nursing home.  The history is provided by the patient.    Past Medical History:  Diagnosis Date  . Amenorrhea   . Diabetes mellitus without complication (HCC)   . Hypertension     There are no active problems to display for this patient.   History reviewed. No pertinent surgical history.   OB History   None      Home Medications    Prior to Admission medications   Medication Sig Start Date End Date Taking? Authorizing Provider  acetaminophen (TYLENOL) 500 MG tablet Take 500 mg by mouth every 6 (six) hours as needed for moderate pain.   Yes  [provider]  Chlorpheniramine Maleate (ALLERGY PO) Take 1 tablet by mouth daily as needed (allergies).   Yes [provider]  diphenhydrAMINE (BENADRYL) 25 MG tablet Take 25 mg by mouth every 6 (six) hours as needed for itching or allergies.   Yes [provider]  guaifenesin (ROBITUSSIN) 100 MG/5ML syrup Take 200 mg by mouth 3 (three) times daily as needed for cough.   Yes [provider]  ibuprofen (ADVIL,MOTRIN) 200 MG tablet Take 400 mg by mouth every 6 (six) hours as needed.   Yes [provider]  lisinopril (PRINIVIL,ZESTRIL) 10 MG tablet Take 10 mg by mouth daily.   Yes [provider]  metFORMIN (GLUCOPHAGE) 500 MG tablet Take 500 mg by mouth at bedtime.    Yes [provider]  ondansetron (ZOFRAN ODT) 4 MG disintegrating tablet 4mg  ODT q4 hours prn nausea/vomit 01/08/18  Yes Dartha LodgeFord, Kelsey N, PA-C  sodium chloride (OCEAN) 0.65 % SOLN nasal spray Place 1 spray into both nostrils as needed for congestion. 11/03/17  Yes Linus MakoBurky, Natalie B, NP  spironolactone (ALDACTONE) 25 MG tablet Take 25 mg by mouth daily.   Yes [provider]  azithromycin (ZITHROMAX) 250 MG tablet Take first 2 tablets together, then 1 every day until finished. Patient not taking: Reported on 01/08/2018 11/01/17   Linus MakoBurky, Natalie B, NP  benzonatate (TESSALON) 100 MG capsule Take 1  capsule (100 mg total) by mouth every 8 (eight) hours. 01/15/18   Shaunee Mulkern A, PA-C  oseltamivir (TAMIFLU) 75 MG capsule Take 1 capsule (75 mg total) by mouth every 12 (twelve) hours. Patient not taking: Reported on 01/15/2018 01/08/18   Dartha Lodge, PA-C  promethazine-dextromethorphan (PROMETHAZINE-DM) 6.25-15 MG/5ML syrup Take 5 mLs by mouth every 6 (six) hours as needed for cough. 01/15/18   Jahmad Petrich A, PA-C  ranitidine (ZANTAC) 150 MG capsule Take 1 capsule (150 mg total) by mouth daily. Patient not taking: Reported on 01/08/2018 11/01/17   Georgetta Haber, NP    Family  History No family history on file.  Social History Social History   Tobacco Use  . Smoking status: Never Smoker  . Smokeless tobacco: Never Used  Substance Use Topics  . Alcohol use: Yes    Comment: occasionally  . Drug use: No     Allergies   Patient has no known allergies.   Review of Systems Review of Systems  Constitutional: Negative for chills and fever.  HENT: Negative for congestion and sore throat.   Respiratory: Positive for cough. Negative for shortness of breath.   Cardiovascular: Positive for chest pain (With cough only).  Gastrointestinal: Negative for abdominal pain, nausea and vomiting.  Neurological: Negative for syncope and headaches.  All other systems reviewed and are negative.    Physical Exam Updated Vital Signs BP (!) 150/107 (BP Location: Left Arm)   Pulse 82   Temp 98.3 F (36.8 C) (Oral)   Resp 12   SpO2 99%   Physical Exam  Constitutional: She appears well-developed and well-nourished. No distress.  HENT:  Head: Normocephalic and atraumatic.  Right Ear: Tympanic membrane, external ear and ear canal normal.  Left Ear: Tympanic membrane, external ear and ear canal normal.  Nose: Mucosal edema present. Right sinus exhibits no maxillary sinus tenderness and no frontal sinus tenderness. Left sinus exhibits no maxillary sinus tenderness and no frontal sinus tenderness.  Mouth/Throat: Uvula is midline, oropharynx is clear and moist and mucous membranes are normal. No trismus in the jaw. No oropharyngeal exudate, posterior oropharyngeal edema, posterior oropharyngeal erythema or tonsillar abscesses.  Mild postnasal drip noted posteriorly.  Mild mucosal edema bilaterally, nasal septum midline  Eyes: Pupils are equal, round, and reactive to light. Conjunctivae and EOM are normal. Right eye exhibits no discharge. Left eye exhibits no discharge.  Neck: Normal range of motion. Neck supple. No JVD present. No tracheal deviation present.  Cardiovascular:  Normal rate, regular rhythm, normal heart sounds and intact distal pulses.  Pulmonary/Chest: Effort normal and breath sounds normal. No stridor. No respiratory distress. She has no wheezes. She has no rales. She exhibits no tenderness.  Equal rise and fall of chest, no increased work of breathing, speaking in full sentences without difficulty.  Abdominal: Soft. Bowel sounds are normal. She exhibits no distension. There is no tenderness.  Musculoskeletal: Normal range of motion. She exhibits no edema.  Neurological: She is alert.  Skin: Skin is warm and dry. No erythema.  Psychiatric: She has a normal mood and affect. Her behavior is normal.  Nursing note and vitals reviewed.    ED Treatments / Results  Labs (all labs ordered are listed, but only abnormal results are displayed) Labs Reviewed - No data to display  EKG None  Radiology No results found.  Procedures Procedures (including critical care time)  Medications Ordered in ED Medications - No data to display   Initial Impression / Assessment and  Plan / ED Course  I have reviewed the triage vital signs and the nursing notes.  Pertinent labs & imaging results that were available during my care of the patient were reviewed by me and considered in my medical decision making (see chart for details).     Patient presents today for a complaint of ongoing cough for 9 days.  She was seen and evaluated 1 week ago with flulike symptoms and was febrile and tachycardic at that time.  Nearly all of her symptoms have resolved aside from her cough and mild nasal congestion.  Today she is afebrile and her blood pressure is at her baseline, vital signs are within normal limits otherwise.  She is nontoxic and nonseptic in appearance.  Lungs are clear to auscultation bilaterally.  She has some postnasal drip and nasal congestion on examination.  I had a long discussion regarding the utility and the risks of obtaining a chest x-ray today and she  declines imaging at this time stating that she does not think that she has a pneumonia.  In the absence of a fever for several days, shortness of breath, hypoxia, or increased work of breathing I have a low suspicion of post influenza pneumonia.  I did instruct the patient to return to the ED for imaging if she has any worsening symptoms or develops a fever.  She has no adventitious sounds on examination and no increased work of breathing.  Chest pain does not appear to be cardiac in etiology and she is low risk for cardiac pathology; her pain is associated with cough only and more likely to be musculoskeletal.  I highly doubt PE.  She tells me she just wants relief of her cough.  Will discharge with Tessalon and cough medicine.  Discussed symptomatic treatment.  Discussed strict ED return precautions.  Recommend follow-up with a primary care physician for reevaluation of her symptoms.  Pt verbalized understanding of and agreement with plan and is safe for discharge home at this time.   Final Clinical Impressions(s) / ED Diagnoses   Final diagnoses:  Cough    ED Discharge Orders        Ordered    benzonatate (TESSALON) 100 MG capsule  Every 8 hours     01/15/18 1539    promethazine-dextromethorphan (PROMETHAZINE-DM) 6.25-15 MG/5ML syrup  Every 6 hours PRN     01/15/18 1539       Treshaun Carrico, [place] A, PA-C 01/15/18 1844    Pricilla Loveless, MD 01/19/18 1502

## 2018-01-15 NOTE — Discharge Instructions (Signed)
You can start taking promethazine-dextromethorphan cough syrup up to 4 times daily.  You may also try Tessalon for your cough as needed.  Drink plenty of water and get plenty of rest. Alternate 600 mg of ibuprofen and [phone] mg of Tylenol every 3 hours as needed for pain. Do not exceed 4000 mg of Tylenol daily.  Take ibuprofen with food to avoid upset stomach issues.  Follow-up with your primary care physician if symptoms persist.  Return to the emergency department if any concerning signs or symptoms develop such as high fever, worsening cough, chest pain, shortness of breath, or coughing up blood.

## 2018-01-15 NOTE — ED Triage Notes (Signed)
Pt reports that she was seen here on Friday and started on tamiflu and taken all the OTC medications that she was supposed to but her cough is still really bad esp at night. Pt reports that she leaks her bladder when she coughs.

## 2018-03-15 ENCOUNTER — Other Ambulatory Visit: Payer: Self-pay

## 2018-03-15 ENCOUNTER — Encounter (HOSPITAL_COMMUNITY): Payer: Self-pay

## 2018-03-15 ENCOUNTER — Emergency Department (HOSPITAL_COMMUNITY)
Admission: EM | Admit: 2018-03-15 | Discharge: 2018-03-15 | Disposition: A | Discharge status: Home / Self Care | Payer: Self-pay | Attending: Emergency Medicine | Admitting: Emergency Medicine

## 2018-03-15 DIAGNOSIS — Z79899 Other long term (current) drug therapy: Secondary | ICD-10-CM | POA: Insufficient documentation

## 2018-03-15 DIAGNOSIS — K640 First degree hemorrhoids: Secondary | ICD-10-CM

## 2018-03-15 DIAGNOSIS — I10 Essential (primary) hypertension: Secondary | ICD-10-CM | POA: Insufficient documentation

## 2018-03-15 DIAGNOSIS — E119 Type 2 diabetes mellitus without complications: Secondary | ICD-10-CM | POA: Insufficient documentation

## 2018-03-15 DIAGNOSIS — K641 Second degree hemorrhoids: Secondary | ICD-10-CM

## 2018-03-15 DIAGNOSIS — Z7984 Long term (current) use of oral hypoglycemic drugs: Secondary | ICD-10-CM | POA: Insufficient documentation

## 2018-03-15 MED ORDER — HYDROCORTISONE 2.5 % RE CREA: TOPICAL_CREAM | RECTAL | 1 refills | Status: DC

## 2018-03-15 MED ORDER — DOCUSATE SODIUM 100 MG PO CAPS: 100.0000 mg | ORAL_CAPSULE | Freq: Two times a day (BID) | ORAL | 0 refills | Status: DC

## 2018-03-15 NOTE — ED Provider Notes (Signed)
[facility]-EMERGENCY DEPT Provider Note   CSN: [id number] Arrival date & time: 03/15/18  1526     History   Chief Complaint Chief Complaint  Patient presents with  . Rectal Pain  . Rectal Bleeding    HPI Melissa Fleming is a 32 y.o. female.  Patient complains of rectal pain.  And she is had mild bleeding.  The history is provided by the patient. No language interpreter was used.  Rectal Bleeding  Quality:  Bright red Amount:  Scant Timing:  Intermittent Chronicity:  New Context: not anal fissures   Similar prior episodes: no   Relieved by:  Nothing Worsened by:  Nothing Associated symptoms: no abdominal pain     Past Medical History:  Diagnosis Date  . Amenorrhea   . Diabetes mellitus without complication (HCC)   . Hypertension     There are no active problems to display for this patient.   History reviewed. No pertinent surgical history.   OB History   None      Home Medications    Prior to Admission medications   Medication Sig Start Date End Date Taking? Authorizing Provider  acetaminophen (TYLENOL) 500 MG tablet Take 500 mg by mouth every 6 (six) hours as needed for moderate pain.   Yes [provider]  Chlorpheniramine Maleate (ALLERGY PO) Take 1 tablet by mouth daily as needed (allergies).   Yes [provider]  diphenhydrAMINE (BENADRYL) 25 MG tablet Take 25 mg by mouth every 6 (six) hours as needed for itching or allergies.   Yes [provider]  guaifenesin (ROBITUSSIN) 100 MG/5ML syrup Take 200 mg by mouth 3 (three) times daily as needed for cough.   Yes [provider]  ibuprofen (ADVIL,MOTRIN) 200 MG tablet Take 400 mg by mouth every 6 (six) hours as needed for moderate pain.    Yes [provider]  lisinopril (PRINIVIL,ZESTRIL) 10 MG tablet Take 10 mg by mouth daily.   Yes [provider]  metFORMIN (GLUCOPHAGE) 500 MG tablet Take 500 mg by mouth at bedtime.    Yes  [provider]  spironolactone (ALDACTONE) 25 MG tablet Take 25 mg by mouth daily.   Yes [provider]  azithromycin (ZITHROMAX) 250 MG tablet Take first 2 tablets together, then 1 every day until finished. Patient not taking: Reported on 01/08/2018 11/01/17   Linus Mako B, NP  benzonatate (TESSALON) 100 MG capsule Take 1 capsule (100 mg total) by mouth every 8 (eight) hours. Patient not taking: Reported on 03/15/2018 01/15/18   Michela Pitcher A, PA-C  docusate sodium (COLACE) 100 MG capsule Take 1 capsule (100 mg total) by mouth every 12 (twelve) hours. 03/15/18   Bethann Berkshire, MD  hydrocortisone (ANUSOL-HC) 2.5 % rectal cream Apply rectally 2 times daily 03/15/18   Bethann Berkshire, MD  ondansetron (ZOFRAN ODT) 4 MG disintegrating tablet 4mg  ODT q4 hours prn nausea/vomit Patient not taking: Reported on 03/15/2018 01/08/18   Dartha Lodge, PA-C  oseltamivir (TAMIFLU) 75 MG capsule Take 1 capsule (75 mg total) by mouth every 12 (twelve) hours. Patient not taking: Reported on 01/15/2018 01/08/18   Dartha Lodge, PA-C  promethazine-dextromethorphan (PROMETHAZINE-DM) 6.25-15 MG/5ML syrup Take 5 mLs by mouth every 6 (six) hours as needed for cough. Patient not taking: Reported on 03/15/2018 01/15/18   Michela Pitcher A, PA-C  ranitidine (ZANTAC) 150 MG capsule Take 1 capsule (150 mg total) by mouth daily. Patient not taking: Reported on 01/08/2018 11/01/17   Karin Lieu,  Barron AlvineNatalie B, NP  sodium chloride (OCEAN) 0.65 % SOLN nasal spray Place 1 spray into both nostrils as needed for congestion. Patient not taking: Reported on 03/15/2018 11/03/17   Georgetta HaberBurky, Natalie B, NP    Family History Family History  Problem Relation Age of Onset  . Diabetes Mother   . Hypertension Mother     Social History Social History   Tobacco Use  . Smoking status: Never Smoker  . Smokeless tobacco: Never Used  Substance Use Topics  . Alcohol use: Yes    Comment: occasionally  . Drug use: No     Allergies   Patient  has no known allergies.   Review of Systems Review of Systems  Constitutional: Negative for appetite change and fatigue.  HENT: Negative for congestion, ear discharge and sinus pressure.   Eyes: Negative for discharge.  Respiratory: Negative for cough.   Cardiovascular: Negative for chest pain.  Gastrointestinal: Positive for hematochezia. Negative for abdominal pain and diarrhea.       Rectal pain and bleeding  Genitourinary: Negative for frequency and hematuria.  Musculoskeletal: Negative for back pain.  Skin: Negative for rash.  Neurological: Negative for seizures and headaches.  Psychiatric/Behavioral: Negative for hallucinations.     Physical Exam Updated Vital Signs BP (!) 145/85 (BP Location: Right Arm)   Pulse 71   Temp 98.5 F (36.9 C) (Oral)   Resp 14   Ht 5' (1.524 m)   Wt 91.6 kg (202 lb)   SpO2 100%   BMI 39.45 kg/m   Physical Exam  Constitutional: She is oriented to person, place, and time. She appears well-developed.  HENT:  Head: Normocephalic.  Eyes: Conjunctivae and EOM are normal. No scleral icterus.  Neck: Neck supple. No thyromegaly present.  Cardiovascular: Normal rate and regular rhythm. Exam reveals no gallop and no friction rub.  No murmur heard. Pulmonary/Chest: No stridor. She has no wheezes. She has no rales. She exhibits no tenderness.  Abdominal: She exhibits no distension. There is no tenderness. There is no rebound.  Genitourinary:  Genitourinary Comments: Patient has a nonthrombosed external hemorrhoid that is not bleeding  Musculoskeletal: Normal range of motion. She exhibits no edema.  Lymphadenopathy:    She has no cervical adenopathy.  Neurological: She is oriented to person, place, and time. She exhibits normal muscle tone. Coordination normal.  Skin: No rash noted. No erythema.  Psychiatric: She has a normal mood and affect. Her behavior is normal.     ED Treatments / Results  Labs (all labs ordered are listed, but only  abnormal results are displayed) Labs Reviewed - No data to display  EKG None  Radiology No results found.  Procedures Procedures (including critical care time)  Medications Ordered in ED Medications - No data to display   Initial Impression / Assessment and Plan / ED Course  I have reviewed the triage vital signs and the nursing notes.  Pertinent labs & imaging results that were available during my care of the patient were reviewed by me and considered in my medical decision making (see chart for details).   Patient with nonthrombosed external hemorrhoid that is tender.  She will be given Anusol cream Colace and referred to GI  Final Clinical Impressions(s) / ED Diagnoses   Final diagnoses:  Grade I hemorrhoids  Grade II hemorrhoids    ED Discharge Orders        Ordered    hydrocortisone (ANUSOL-HC) 2.5 % rectal cream     03/15/18 2117  docusate sodium (COLACE) 100 MG capsule  Every 12 hours     03/15/18 2117       Bethann Berkshire, MD 03/15/18 2123

## 2018-03-15 NOTE — ED Triage Notes (Signed)
Patient reports she has a hemorrhoid and began having rectal pain and a small amount of bright red bleeding in the toilet and on the toilet paper.

## 2018-03-15 NOTE — Discharge Instructions (Addendum)
Follow up with dr. Matthias HughsBuccini or one of his partners in 1 week.  Call for an appointment

## 2018-06-25 ENCOUNTER — Ambulatory Visit (HOSPITAL_COMMUNITY)
Admission: EM | Admit: 2018-06-25 | Discharge: 2018-06-25 | Disposition: A | Discharge status: Home / Self Care | Payer: Self-pay | Attending: Family Medicine | Admitting: Family Medicine

## 2018-06-25 ENCOUNTER — Encounter (HOSPITAL_COMMUNITY): Payer: Self-pay | Admitting: Family Medicine

## 2018-06-25 DIAGNOSIS — J0100 Acute maxillary sinusitis, unspecified: Secondary | ICD-10-CM

## 2018-06-25 DIAGNOSIS — J683 Other acute and subacute respiratory conditions due to chemicals, gases, fumes and vapors: Secondary | ICD-10-CM

## 2018-06-25 MED ORDER — AMOXICILLIN 875 MG PO TABS: 875.0000 mg | ORAL_TABLET | Freq: Two times a day (BID) | ORAL | 0 refills | Status: DC

## 2018-06-25 NOTE — Discharge Instructions (Addendum)
The inhaler along with Robitussin will probably help control the coughing.

## 2018-06-25 NOTE — ED Notes (Signed)
Bed: UC01 Expected date:  Expected time:  Means of arrival:  Comments: 

## 2018-06-25 NOTE — ED Triage Notes (Signed)
PT C/O: cold sx onset 1 week associated w/sore throat and chest tightness and HA  DENIES: fevers, SOB  TAKING MEDS: OTC Robitussin w/some relief.   A&O x4... NAD... Ambulatory

## 2018-06-25 NOTE — ED Provider Notes (Signed)
[facility]    CSN: [id number] Arrival date & time: 06/25/18  1657     History   Chief Complaint Chief Complaint  Patient presents with  . Appointment    uri  . URI    HPI Melissa Fleming is a 32 y.o. female.   This 32 year old woman presents with complaints consistent with upper respiratory infection and sore throat.  Her symptoms began 10 days ago with sinus congestion and, after improving with Robitussin product, began worsening again this week.  Patient denies any fever and her cough is nonproductive  She has a history of asthma and uses an inhaler at times but she has not used it this week.  Patient works with the elderly.     Past Medical History:  Diagnosis Date  . Amenorrhea   . Diabetes mellitus without complication (HCC)   . Hypertension     There are no active problems to display for this patient.   History reviewed. No pertinent surgical history.  OB History   None      Home Medications    Prior to Admission medications   Medication Sig Start Date End Date Taking? Authorizing Provider  guaifenesin (ROBITUSSIN) 100 MG/5ML syrup Take 200 mg by mouth 3 (three) times daily as needed for cough.   Yes [provider]  losartan (COZAAR) 25 MG tablet Take 25 mg by mouth daily.   Yes [provider]  metFORMIN (GLUCOPHAGE) 500 MG tablet Take 500 mg by mouth at bedtime.    Yes [provider]  spironolactone (ALDACTONE) 25 MG tablet Take 25 mg by mouth daily.   Yes [provider]  acetaminophen (TYLENOL) 500 MG tablet Take 500 mg by mouth every 6 (six) hours as needed for moderate pain.    [provider]  amoxicillin (AMOXIL) 875 MG tablet Take 1 tablet (875 mg total) by mouth 2 (two) times daily. 06/25/18   Elvina SidleLauenstein, Kyson Kupper, MD  Chlorpheniramine Maleate (ALLERGY PO) Take 1 tablet by mouth daily as needed (allergies).    [provider]  diphenhydrAMINE (BENADRYL) 25 MG tablet Take 25 mg by  mouth every 6 (six) hours as needed for itching or allergies.    [provider]  docusate sodium (COLACE) 100 MG capsule Take 1 capsule (100 mg total) by mouth every 12 (twelve) hours. 03/15/18   Bethann BerkshireZammit, Joseph, MD  hydrocortisone (ANUSOL-HC) 2.5 % rectal cream Apply rectally 2 times daily 03/15/18   Bethann BerkshireZammit, Joseph, MD  ibuprofen (ADVIL,MOTRIN) 200 MG tablet Take 400 mg by mouth every 6 (six) hours as needed for moderate pain.     [provider]  lisinopril (PRINIVIL,ZESTRIL) 10 MG tablet Take 10 mg by mouth daily.    [provider]    Family History Family History  Problem Relation Age of Onset  . Diabetes Mother   . Hypertension Mother     Social History Social History   Tobacco Use  . Smoking status: Never Smoker  . Smokeless tobacco: Never Used  Substance Use Topics  . Alcohol use: Yes    Comment: occasionally  . Drug use: No     Allergies   Patient has no known allergies.   Review of Systems Review of Systems  Constitutional: Negative.   HENT: Positive for sinus pressure and sore throat.   Respiratory: Positive for cough and chest tightness.   Cardiovascular: Negative.      Physical Exam Triage Vital Signs ED Triage Vitals  Enc Vitals Group  BP      Pulse      Resp      Temp      Temp src      SpO2      Weight      Height      Head Circumference      Peak Flow      Pain Score      Pain Loc      Pain Edu?      Excl. in GC?    No data found.  Updated Vital Signs BP (!) 152/97 (BP Location: Left Arm)   Pulse 94   Temp 98.7 F (37.1 C) (Oral)   Resp 20   SpO2 96%    Physical Exam  Constitutional: She is oriented to person, place, and time. She appears well-developed and well-nourished.  HENT:  Right Ear: External ear normal.  Left Ear: External ear normal.  Mouth/Throat: Oropharynx is clear and moist.  Eyes: Pupils are equal, round, and reactive to light. Conjunctivae are normal.  Neck: Normal range of  motion. Neck supple.  Cardiovascular: Normal rate and regular rhythm.  Pulmonary/Chest: Effort normal and breath sounds normal.  Musculoskeletal: Normal range of motion.  Neurological: She is alert and oriented to person, place, and time.  Skin: Skin is warm and dry.  Nursing note and vitals reviewed.    UC Treatments / Results  Labs (all labs ordered are listed, but only abnormal results are displayed) Labs Reviewed - No data to display  EKG None  Radiology No results found.  Procedures Procedures (including critical care time)  Medications Ordered in UC Medications - No data to display  Initial Impression / Assessment and Plan / UC Course  I have reviewed the triage vital signs and the nursing notes.  Pertinent labs & imaging results that were available during my care of the patient were reviewed by me and considered in my medical decision making (see chart for details).    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    ED Prescriptions    Medication Sig Dispense Auth. Provider   amoxicillin (AMOXIL) 875 MG tablet Take 1 tablet (875 mg total) by mouth 2 (two) times daily. 20 tablet Elvina Sidle, MD     Controlled Substance Prescriptions [place] Controlled Substance Registry consulted? Not Applicable   Elvina Sidle, MD 06/25/18 1725

## 2018-08-18 ENCOUNTER — Encounter (HOSPITAL_COMMUNITY): Payer: Self-pay

## 2018-08-18 ENCOUNTER — Emergency Department (HOSPITAL_COMMUNITY)
Admission: EM | Admit: 2018-08-18 | Discharge: 2018-08-18 | Disposition: A | Discharge status: Home / Self Care | Payer: Self-pay | Attending: Emergency Medicine | Admitting: Emergency Medicine

## 2018-08-18 DIAGNOSIS — E119 Type 2 diabetes mellitus without complications: Secondary | ICD-10-CM | POA: Insufficient documentation

## 2018-08-18 DIAGNOSIS — R21 Rash and other nonspecific skin eruption: Secondary | ICD-10-CM | POA: Insufficient documentation

## 2018-08-18 DIAGNOSIS — I10 Essential (primary) hypertension: Secondary | ICD-10-CM | POA: Insufficient documentation

## 2018-08-18 DIAGNOSIS — Z7984 Long term (current) use of oral hypoglycemic drugs: Secondary | ICD-10-CM | POA: Insufficient documentation

## 2018-08-18 DIAGNOSIS — Z79899 Other long term (current) drug therapy: Secondary | ICD-10-CM | POA: Insufficient documentation

## 2018-08-18 NOTE — ED Provider Notes (Signed)
[facility]   CSN: [id number] Arrival date & time: 08/18/18  2147     History   Chief Complaint Chief Complaint  Patient presents with  . Rash    HPI Melissa Fleming is a 32 y.o. female.  The history is provided by the patient and medical records. No language interpreter was used.     [name] is a 32 y.o. female  with a PMH of DM, HTN who presents to the Emergency Department complaining of a new rash to the forehead which developed about 4 PM today.  She notes a very small white bumps to her forehead which she has never experienced before.  She denies any new soaps, lotions, make-up, skin products, etc.  No known allergies.  No new medications.  No known trigger.  Denies any history of similar.  No fever, chills or recent illness.  Rash will intermittently burned.  She has tried an over-the-counter anti-itch cream with some relief.   Past Medical History:  Diagnosis Date  . Amenorrhea   . Diabetes mellitus without complication (HCC)   . Hypertension     There are no active problems to display for this patient.   History reviewed. No pertinent surgical history.   OB History   None      Home Medications    Prior to Admission medications   Medication Sig Start Date End Date Taking? Authorizing Provider  acetaminophen (TYLENOL) 500 MG tablet Take 500 mg by mouth every 6 (six) hours as needed for moderate pain.    [provider]  amoxicillin (AMOXIL) 875 MG tablet Take 1 tablet (875 mg total) by mouth 2 (two) times daily. 06/25/18   Elvina SidleLauenstein, Kurt, MD  Chlorpheniramine Maleate (ALLERGY PO) Take 1 tablet by mouth daily as needed (allergies).    [provider]  diphenhydrAMINE (BENADRYL) 25 MG tablet Take 25 mg by mouth every 6 (six) hours as needed for itching or allergies.    [provider]  docusate sodium (COLACE) 100 MG capsule Take 1 capsule (100 mg total) by mouth every 12  (twelve) hours. 03/15/18   Bethann BerkshireZammit, Joseph, MD  guaifenesin (ROBITUSSIN) 100 MG/5ML syrup Take 200 mg by mouth 3 (three) times daily as needed for cough.    [provider]  hydrocortisone (ANUSOL-HC) 2.5 % rectal cream Apply rectally 2 times daily 03/15/18   Bethann BerkshireZammit, Joseph, MD  ibuprofen (ADVIL,MOTRIN) 200 MG tablet Take 400 mg by mouth every 6 (six) hours as needed for moderate pain.     [provider]  lisinopril (PRINIVIL,ZESTRIL) 10 MG tablet Take 10 mg by mouth daily.    [provider]  losartan (COZAAR) 25 MG tablet Take 25 mg by mouth daily.    [provider]  metFORMIN (GLUCOPHAGE) 500 MG tablet Take 500 mg by mouth at bedtime.     [provider]  spironolactone (ALDACTONE) 25 MG tablet Take 25 mg by mouth daily.    [provider]    Family History Family History  Problem Relation Age of Onset  . Diabetes Mother   . Hypertension Mother     Social History Social History   Tobacco Use  . Smoking status: Never Smoker  . Smokeless tobacco: Never Used  Substance Use Topics  . Alcohol use: Yes    Comment: occasionally  . Drug use: No     Allergies   Patient has no known allergies.   Review of Systems Review of Systems  Constitutional: Negative for chills and fever.  Musculoskeletal: Negative for arthralgias and myalgias.  Skin: Positive for rash. Negative for wound.     Physical Exam Updated Vital Signs BP (!) 139/94   Pulse 91   Temp 98.1 F (36.7 C) (Oral)   Resp 16   SpO2 99%   Physical Exam  Constitutional: She is oriented to person, place, and time. She appears well-developed and well-nourished. No distress.  Afebrile, non-toxic appearing.  HENT:  Head: Normocephalic and atraumatic.  No oral lesions.  Eyes: Conjunctivae are normal. Right eye exhibits no discharge. Left eye exhibits no discharge.  Cardiovascular: Normal rate, regular rhythm and normal heart sounds.  Pulmonary/Chest: Effort normal  and breath sounds normal. No respiratory distress. She has no wheezes. She has no rales.  Musculoskeletal: Normal range of motion.  Neurological: She is alert and oriented to person, place, and time.  Skin: Skin is warm and dry. Capillary refill takes less than 2 seconds.  Fine, pinpoint rash to the forehead. No drainage. No warmth. Not tender to the touch. No lesions to the palms or soles. No petechiae, purpura, bulla, desquamation or target lesions.  Nursing note and vitals reviewed.    ED Treatments / Results  Labs (all labs ordered are listed, but only abnormal results are displayed) Labs Reviewed - No data to display  EKG None  Radiology No results found.  Procedures Procedures (including critical care time)  Medications Ordered in ED Medications - No data to display   Initial Impression / Assessment and Plan / ED Course  I have reviewed the triage vital signs and the nursing notes.  Pertinent labs & imaging results that were available during my care of the patient were reviewed by me and considered in my medical decision making (see chart for details).    Melissa Fleming is a 32 y.o. female who presents to ED for rash to her forehead which just developed.   Patient denies any difficulty breathing or swallowing. Patient has a patent airway without stridor and is handling secretions without difficulty; no angioedema. No blisters, pustules, warmth, abscesses, bullous impetigo, vesicles, desquamation or target lesions. Rash is not tender to the touch. No concern for superimposed infection. No concern for SJS, TEN, TSS, tick borne illness, syphilis or other life-threatening condition.   I believe the rash will likely resolve without any further intervention.  Would like to avoid a steroid cream given the area is her face.  Instructed her to keep the area clean and dry.  PCP follow up strongly encouraged if symptoms persist. Reasons to return to ER discussed and all questions  answered.   Final Clinical Impressions(s) / ED Diagnoses   Final diagnoses:  Rash    ED Discharge Orders    None       Kamariah Fruchter, Chase Picket, PA-C 08/18/18 2217    Sabas Sous, MD 08/19/18 0001

## 2018-08-18 NOTE — Discharge Instructions (Signed)
It was my pleasure taking care of you today!   Keep your forehead clean and dry.  Follow up with your doctor if symptoms are not improving.   Return to ER for new or worsening symptoms, any additional concerns.

## 2018-08-18 NOTE — ED Triage Notes (Signed)
Pt states that she has a rash come up on her forehead several hours ago, some small white bumps noted.

## 2018-11-14 ENCOUNTER — Other Ambulatory Visit (HOSPITAL_COMMUNITY): Payer: Self-pay | Admitting: Emergency Medicine

## 2018-11-14 ENCOUNTER — Encounter (HOSPITAL_COMMUNITY): Payer: Self-pay

## 2018-11-14 ENCOUNTER — Emergency Department (HOSPITAL_COMMUNITY)
Admission: EM | Admit: 2018-11-14 | Discharge: 2018-11-14 | Disposition: A | Discharge status: Home / Self Care | Payer: Self-pay | Attending: Emergency Medicine | Admitting: Emergency Medicine

## 2018-11-14 ENCOUNTER — Emergency Department (HOSPITAL_COMMUNITY): Payer: Self-pay

## 2018-11-14 DIAGNOSIS — I10 Essential (primary) hypertension: Secondary | ICD-10-CM | POA: Insufficient documentation

## 2018-11-14 DIAGNOSIS — E119 Type 2 diabetes mellitus without complications: Secondary | ICD-10-CM | POA: Insufficient documentation

## 2018-11-14 DIAGNOSIS — Z7984 Long term (current) use of oral hypoglycemic drugs: Secondary | ICD-10-CM | POA: Insufficient documentation

## 2018-11-14 DIAGNOSIS — J111 Influenza due to unidentified influenza virus with other respiratory manifestations: Secondary | ICD-10-CM | POA: Insufficient documentation

## 2018-11-14 DIAGNOSIS — R69 Illness, unspecified: Secondary | ICD-10-CM

## 2018-11-14 DIAGNOSIS — Z79899 Other long term (current) drug therapy: Secondary | ICD-10-CM | POA: Insufficient documentation

## 2018-11-14 MED ORDER — BENZONATATE 100 MG PO CAPS
200.0000 mg | ORAL_CAPSULE | Freq: Once | ORAL | Status: AC
Start: 2018-11-14 — End: 2018-11-14
  Administered 2018-11-14: 200 mg via ORAL
  Filled 2018-11-14: qty 2

## 2018-11-14 MED ORDER — BENZONATATE 100 MG PO CAPS: 100.0000 mg | ORAL_CAPSULE | Freq: Three times a day (TID) | ORAL | 0 refills | Status: DC

## 2018-11-14 MED ORDER — OSELTAMIVIR PHOSPHATE 75 MG PO CAPS: 75.0000 mg | ORAL_CAPSULE | Freq: Two times a day (BID) | ORAL | 0 refills | Status: DC

## 2018-11-14 NOTE — ED Notes (Signed)
ED Provider at bedside. 

## 2018-11-14 NOTE — ED Provider Notes (Signed)
[facility] EMERGENCY DEPARTMENT Provider Note   CSN: [id number] Arrival date & time: 11/14/18  1311     History   Chief Complaint Chief Complaint  Patient presents with  . Cough  . Generalized Body Aches    HPI Melissa Fleming is a 33 y.o. female.  HPI  33 year old female, she reports a history of polycystic ovaries, she denies a history of diabetes but states she takes metformin.  She has history of asthma or bronchitis and uses albuterol inhaler intermittently.  Approximately 2 days the patient started to have fevers body aches and then started to have coughing headache runny nose and a sore throat.  She reports that yesterday the symptoms intensified and she was making copious amounts of mucus.  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  She denies any dysuria diarrhea rashes or swelling.  Symptoms are persistent.  Past Medical History:  Diagnosis Date  . Amenorrhea   . Diabetes mellitus without complication (HCC)   . Hypertension     There are no active problems to display for this patient.   History reviewed. No pertinent surgical history.   OB History   No obstetric history on file.      Home Medications    Prior to Admission medications   Medication Sig Start Date End Date Taking? Authorizing Provider  acetaminophen (TYLENOL) 500 MG tablet Take 500 mg by mouth every 6 (six) hours as needed for moderate pain.    [provider]  amoxicillin (AMOXIL) 875 MG tablet Take 1 tablet (875 mg total) by mouth 2 (two) times daily. 06/25/18   Elvina Sidle, MD  benzonatate (TESSALON) 100 MG capsule Take 1 capsule (100 mg total) by mouth every 8 (eight) hours. 11/14/18   Eber Hong, MD  Chlorpheniramine Maleate (ALLERGY PO) Take 1 tablet by mouth daily as needed (allergies).    [provider]  diphenhydrAMINE (BENADRYL) 25 MG  tablet Take 25 mg by mouth every 6 (six) hours as needed for itching or allergies.    [provider]  docusate sodium (COLACE) 100 MG capsule Take 1 capsule (100 mg total) by mouth every 12 (twelve) hours. 03/15/18   Bethann Berkshire, MD  guaifenesin (ROBITUSSIN) 100 MG/5ML syrup Take 200 mg by mouth 3 (three) times daily as needed for cough.    [provider]  hydrocortisone (ANUSOL-HC) 2.5 % rectal cream Apply rectally 2 times daily 03/15/18   Bethann Berkshire, MD  ibuprofen (ADVIL,MOTRIN) 200 MG tablet Take 400 mg by mouth every 6 (six) hours as needed for moderate pain.     [provider]  lisinopril (PRINIVIL,ZESTRIL) 10 MG tablet Take 10 mg by mouth daily.    [provider]  losartan (COZAAR) 25 MG tablet Take 25 mg by mouth daily.    [provider]  metFORMIN (GLUCOPHAGE) 500 MG tablet Take 500 mg by mouth at bedtime.     [provider]  oseltamivir (TAMIFLU) 75 MG capsule Take 1 capsule (75 mg total) by mouth every 12 (twelve) hours. 11/14/18   Eber Hong, MD  spironolactone (ALDACTONE) 25 MG tablet Take 25 mg by mouth daily.    [provider]    Family History Family History  Problem Relation Age of Onset  . Diabetes Mother   . Hypertension Mother     Social History Social History   Tobacco Use  .  Smoking status: Never Smoker  . Smokeless tobacco: Never Used  Substance Use Topics  . Alcohol use: Yes    Comment: occasionally  . Drug use: No     Allergies   Patient has no known allergies.   Review of Systems Review of Systems  All other systems reviewed and are negative.    Physical Exam Updated Vital Signs BP (!) 145/102   Pulse (!) 103   Temp 98.9 F (37.2 C) (Oral)   Resp 18   SpO2 98%   Physical Exam Vitals signs and nursing note reviewed.  Constitutional:      General: She is not in acute distress.    Appearance: She is well-developed.  HENT:     Head: Normocephalic and atraumatic.      Mouth/Throat:     Pharynx: Posterior oropharyngeal erythema present. No oropharyngeal exudate.  Eyes:     General: No scleral icterus.       Right eye: No discharge.        Left eye: No discharge.     Conjunctiva/sclera: Conjunctivae normal.     Pupils: Pupils are equal, round, and reactive to light.  Neck:     Musculoskeletal: Normal range of motion and neck supple.     Thyroid: No thyromegaly.     Vascular: No JVD.  Cardiovascular:     Rate and Rhythm: Normal rate and regular rhythm.     Heart sounds: Normal heart sounds. No murmur. No friction rub. No gallop.   Pulmonary:     Effort: Pulmonary effort is normal. No respiratory distress.     Breath sounds: Wheezing ( Slight expiratory wheezing but no distress or increased work of breathing or accessory muscle use) present. No rales.  Abdominal:     General: Bowel sounds are normal. There is no distension.     Palpations: Abdomen is soft. There is no mass.     Tenderness: There is no abdominal tenderness.  Musculoskeletal: Normal range of motion.        General: No tenderness.  Lymphadenopathy:     Cervical: No cervical adenopathy.  Skin:    General: Skin is warm and dry.     Findings: No erythema or rash.  Neurological:     Mental Status: She is alert.     Coordination: Coordination normal.  Psychiatric:        Behavior: Behavior normal.      ED Treatments / Results  Labs (all labs ordered are listed, but only abnormal results are displayed) Labs Reviewed - No data to display  EKG None  Radiology Dg Chest 2 View  Result Date: 11/14/2018 CLINICAL DATA:  33 year old female with cough congestion and body aches for 2 days with fever. EXAM: CHEST - 2 VIEW COMPARISON:  01/08/2018 and earlier. FINDINGS: Lung volumes and mediastinal contours remain normal. Visualized tracheal air column is within normal limits. No pneumothorax, pulmonary edema, pleural effusion or acute pulmonary opacity. No osseous abnormality identified.  Negative visible bowel gas pattern. IMPRESSION: Negative.  No cardiopulmonary abnormality. Electronically Signed   By: Odessa FlemingH  Hall M.D.   On: 11/14/2018 14:28    Procedures Procedures (including critical care time)  Medications Ordered in ED Medications  benzonatate (TESSALON) capsule 200 mg (200 mg Oral Given 11/14/18 1338)     Initial Impression / Assessment and Plan / ED Course  I have reviewed the triage vital signs and the nursing notes.  Pertinent labs & imaging results that were available during my care of  the patient were reviewed by me and considered in my medical decision making (see chart for details).  Clinical Course as of Nov 14 1436  Wynelle LinkSun Nov 14, 2018  1434 Chest x-ray is totally negative, I have personally looked at the x-rays and I agree with the radiologist interpretation.   [BM]    Clinical Course User Index [BM] Eber HongMiller, Tatyanna Cronk, MD   At this time it appears that the patient has most likely a flulike illness but with increasing shortness of breath and copious amounts of sputum will obtain x-ray to make sure there is no pneumonia.  Otherwise anticipate Tamiflu cough suppressants and discharge keeping her out of work for several days.  She does not appear toxic at this time  Vitals:   11/14/18 1314  BP: (!) 145/102  Pulse: (!) 103  Resp: 18  Temp: 98.9 F (37.2 C)  TempSrc: Oral  SpO2: 98%    Vital signs are unremarkable without fever or significant tachycardia or hypotension.  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   Final Clinical Impressions(s) / ED Diagnoses   Final diagnoses:  Influenza-like illness    ED Discharge Orders         Ordered    oseltamivir (TAMIFLU) 75 MG capsule  Every 12 hours     11/14/18 1436    benzonatate (TESSALON) 100 MG capsule  Every 8 hours     11/14/18 1436           Eber HongMiller, Jamillia Closson, MD 11/14/18 1438

## 2018-11-14 NOTE — ED Triage Notes (Signed)
Pt reports cough, congestion and gen body aches since Friday along with fevers at home.

## 2018-11-14 NOTE — Discharge Instructions (Signed)
Please take Tamiflu twice daily, Tessalon 3 times daily as needed for coughing, seek medical exam for severe or worsening symptoms including increasing pain shortness of breath or fevers.  Alternate Tylenol and ibuprofen for temperature over 101 and stay out of work for the next 5 days

## 2019-11-18 IMAGING — CR DG CHEST 2V
2 series · 2 of 2 positions shown · non-contrast
Comparison: 01/08/2018 and earlier.

CLINICAL DATA: 32-year-old female with cough congestion and body
aches for 2 days with fever.

EXAM:
CHEST - 2 VIEW

[chest pa]
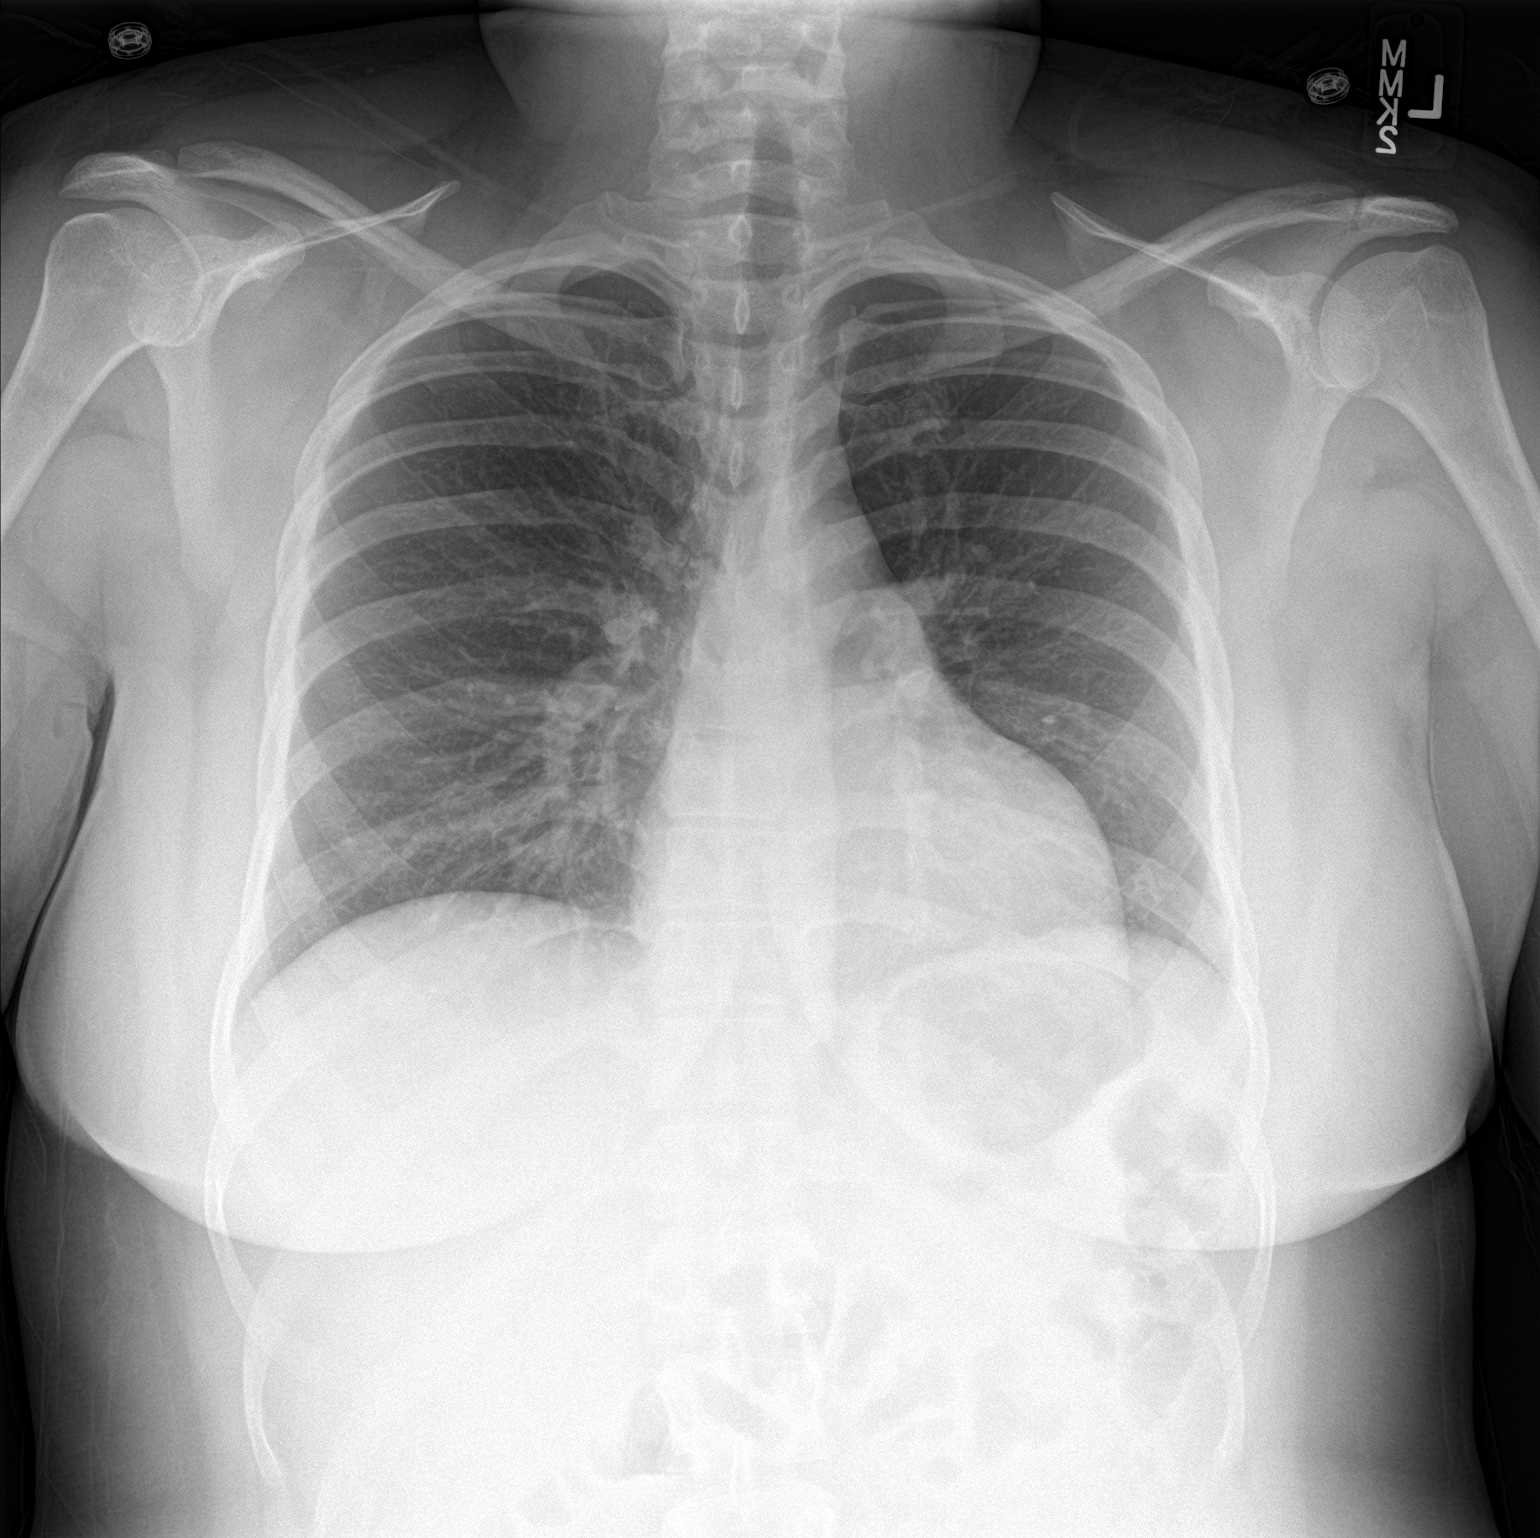

[chest lat]
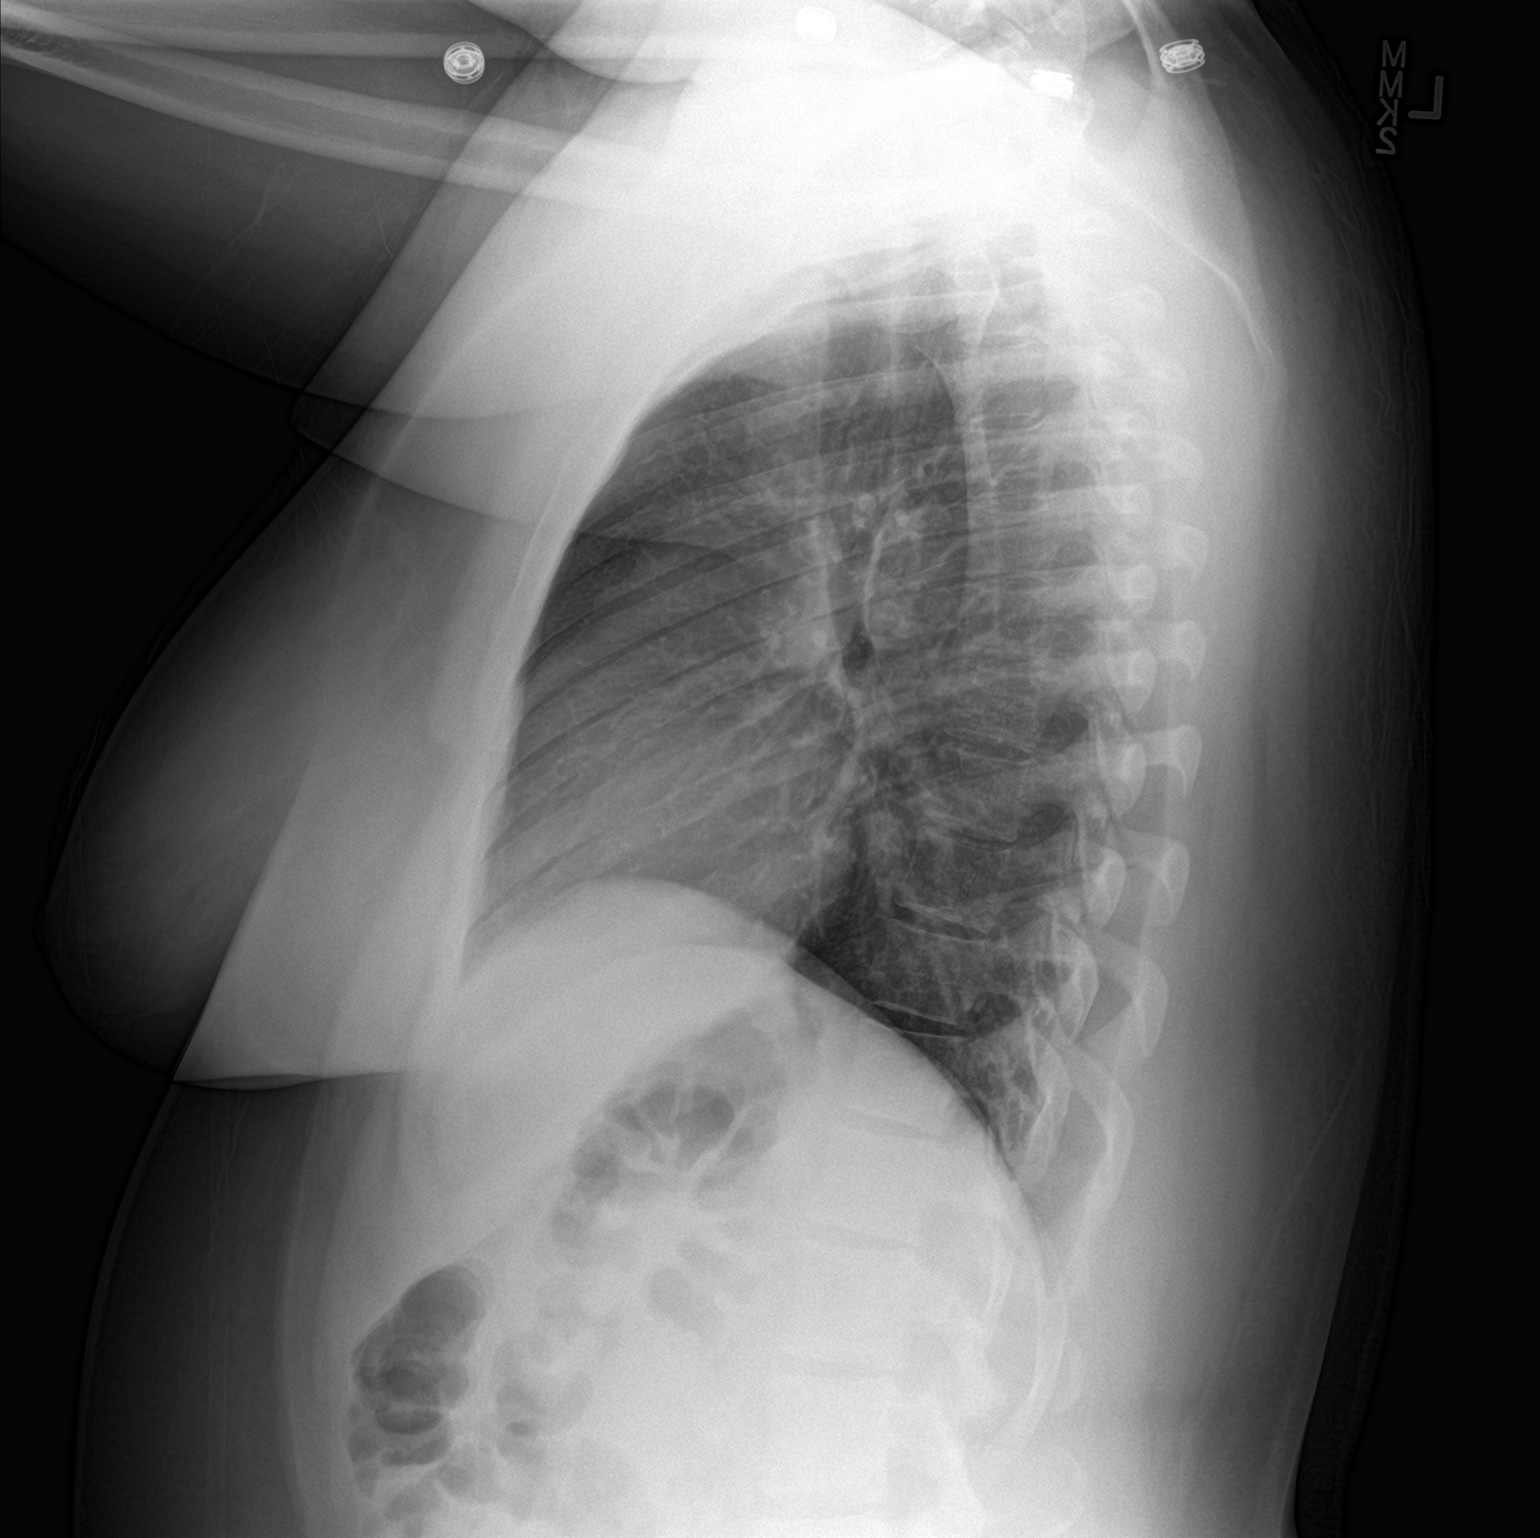

[2 of 2 positions shown; findings below may reference images not displayed]

FINDINGS: Lung volumes and mediastinal contours remain normal. Visualized
tracheal air column is within normal limits. No pneumothorax,
pulmonary edema, pleural effusion or acute pulmonary opacity. No
osseous abnormality identified. Negative visible bowel gas pattern.
IMPRESSION: Negative.  No cardiopulmonary abnormality.

## 2019-12-12 ENCOUNTER — Other Ambulatory Visit: Payer: Self-pay

## 2019-12-12 ENCOUNTER — Emergency Department (HOSPITAL_COMMUNITY): Payer: 59

## 2019-12-12 ENCOUNTER — Encounter (HOSPITAL_COMMUNITY): Payer: Self-pay | Admitting: Emergency Medicine

## 2019-12-12 ENCOUNTER — Emergency Department (HOSPITAL_COMMUNITY)
Admission: EM | Admit: 2019-12-12 | Discharge: 2019-12-12 | Disposition: A | Discharge status: Home / Self Care | Payer: 59 | Attending: Emergency Medicine | Admitting: Emergency Medicine

## 2019-12-12 DIAGNOSIS — E119 Type 2 diabetes mellitus without complications: Secondary | ICD-10-CM | POA: Insufficient documentation

## 2019-12-12 DIAGNOSIS — I10 Essential (primary) hypertension: Secondary | ICD-10-CM | POA: Diagnosis not present

## 2019-12-12 DIAGNOSIS — N939 Abnormal uterine and vaginal bleeding, unspecified: Secondary | ICD-10-CM | POA: Insufficient documentation

## 2019-12-12 DIAGNOSIS — R55 Syncope and collapse: Secondary | ICD-10-CM | POA: Diagnosis not present

## 2019-12-12 DIAGNOSIS — R42 Dizziness and giddiness: Secondary | ICD-10-CM | POA: Insufficient documentation

## 2019-12-12 DIAGNOSIS — R102 Pelvic and perineal pain: Secondary | ICD-10-CM

## 2019-12-12 LAB — CBC
HCT: 39.7 % (ref 36.0–46.0)
Hemoglobin: 12.8 g/dL (ref 12.0–15.0)
MCH: 28.8 pg (ref 26.0–34.0)
MCHC: 32.2 g/dL (ref 30.0–36.0)
MCV: 89.4 fL (ref 80.0–100.0)
Platelets: 418 10*3/uL — ABNORMAL HIGH (ref 150–400)
RBC: 4.44 MIL/uL (ref 3.87–5.11)
RDW: 13.1 % (ref 11.5–15.5)
WBC: 7.6 10*3/uL (ref 4.0–10.5)
nRBC: 0 % (ref 0.0–0.2)

## 2019-12-12 LAB — URINALYSIS, ROUTINE W REFLEX MICROSCOPIC
Bacteria, UA: NONE SEEN
Bilirubin Urine: NEGATIVE
Glucose, UA: NEGATIVE mg/dL
Ketones, ur: NEGATIVE mg/dL
Leukocytes,Ua: NEGATIVE
Nitrite: NEGATIVE
Protein, ur: 100 mg/dL — AB
RBC / HPF: >50 RBC/hpf — ABNORMAL HIGH (ref 0–5)
Specific Gravity, Urine: 1.017 (ref 1.005–1.030)
pH: 8 (ref 5.0–8.0)

## 2019-12-12 LAB — BASIC METABOLIC PANEL
Anion gap: 8 (ref 5–15)
BUN: 7 mg/dL (ref 6–20)
CO2: 26 mmol/L (ref 22–32)
Calcium: 8.8 mg/dL — ABNORMAL LOW (ref 8.9–10.3)
Chloride: 104 mmol/L (ref 98–111)
Creatinine, Ser: 0.62 mg/dL (ref 0.44–1.00)
GFR calc Af Amer: >60 mL/min (ref >60)
GFR calc non Af Amer: >60 mL/min (ref >60)
Glucose, Bld: 105 mg/dL — ABNORMAL HIGH (ref 70–99)
Potassium: 4.1 mmol/L (ref 3.5–5.1)
Sodium: 138 mmol/L (ref 135–145)

## 2019-12-12 LAB — WET PREP, GENITAL
Clue Cells Wet Prep HPF POC: NONE SEEN
Sperm: NONE SEEN
Trich, Wet Prep: NONE SEEN
Yeast Wet Prep HPF POC: NONE SEEN

## 2019-12-12 LAB — I-STAT BETA HCG BLOOD, ED (MC, WL, AP ONLY): I-stat hCG, quantitative: <5 m[IU]/mL (ref <5)

## 2019-12-12 MED ORDER — MEGESTROL ACETATE 40 MG PO TABS: ORAL_TABLET | ORAL | 1 refills | Status: DC

## 2019-12-12 MED ORDER — SODIUM CHLORIDE 0.9% FLUSH: 3.0000 mL | Freq: Once | INTRAVENOUS | Status: DC

## 2019-12-12 NOTE — ED Notes (Signed)
Pt transported to US

## 2019-12-12 NOTE — ED Notes (Signed)
Patient verbalizes understanding of discharge instructions. Opportunity for questioning and answers were provided. Armband removed by staff, pt discharged from ED by wheelchair. Esignature pad not working in order for pt to sign

## 2019-12-12 NOTE — ED Provider Notes (Signed)
Care assumed from [facility] at shift change, please see her note for full details, but in brief Melissa Fleming is a 34 y.o. female who presents with 3 weeks of vaginal bleeding.  She has a history of abnormal vaginal bleeding that usually happens a few times a year but she does not have an OB/GYN she follows for this.  She reports this episode of bleeding has been ongoing for 3 weeks, and is much heavier than usual she continues to pass blood clots.  On exam she had a moderate amount of blood pooled in the vaginal vault with some clots present and had some right adnexal tenderness.  Fortunately patient's hemoglobin is stable she has no other significant derangements on her lab work and negative pregnancy test.  Ultrasound pending at shift change given right adnexal tenderness afterwards we will touch base with OB/GYN to discuss any medication management for bleeding.  BP (!) 169/114 (BP Location: Right Arm)   Pulse 76   Temp 98.3 F (36.8 C) (Oral)   Resp 20   LMP 11/21/2019   SpO2 98%    ED Course/Procedures   Labs Reviewed  WET PREP, GENITAL - Abnormal; Notable for the following components:      Result Value   WBC, Wet Prep HPF POC FEW (*)    All other components within normal limits  BASIC METABOLIC PANEL - Abnormal; Notable for the following components:   Glucose, Bld 105 (*)    Calcium 8.8 (*)    All other components within normal limits  CBC - Abnormal; Notable for the following components:   Platelets 418 (*)    All other components within normal limits  URINALYSIS, ROUTINE W REFLEX MICROSCOPIC - Abnormal; Notable for the following components:   Color, Urine AMBER (*)    APPearance CLOUDY (*)    Hgb urine dipstick LARGE (*)    Protein, ur 100 (*)    RBC / HPF >50 (*)    All other components within normal limits  I-STAT BETA HCG BLOOD, ED ([facility], [facility], [facility] ONLY)  GC/CHLAMYDIA PROBE AMP ([place]) NOT AT [facility]    US PELVIC COMPLETE W TRANSVAGINAL AND TORSION R/O  Result  Date: 12/12/2019 CLINICAL DATA:  Heavy vaginal bleeding for 4 weeks EXAM: TRANSABDOMINAL AND TRANSVAGINAL ULTRASOUND OF PELVIS DOPPLER ULTRASOUND OF OVARIES TECHNIQUE: Both transabdominal and transvaginal ultrasound examinations of the pelvis were performed. Transabdominal technique was performed for global imaging of the pelvis including uterus, ovaries, adnexal regions, and pelvic cul-de-sac. It was necessary to proceed with endovaginal exam following the transabdominal exam to visualize the uterus endometrium ovaries. Color and duplex Doppler ultrasound was utilized to evaluate blood flow to the ovaries. COMPARISON:  None. FINDINGS: Uterus Measurements: 8.4 x 3.5 x 4.7 cm = volume: 70.9 mL. No fibroids or other mass visualized. Endometrium Thickness: 7.7 mm.  No focal abnormality visualized. Right ovary Measurements: 4.1 x 1.6 x 3.5 cm = volume: 11.6 mL. Normal appearance/no adnexal mass. Left ovary Measurements: 3.8 x 2.1 x 3.4 cm = volume: 14.2 mL. Normal appearance/no adnexal mass. Pulsed Doppler evaluation of both ovaries demonstrates normal low-resistance arterial and venous waveforms. Other findings Trace free fluid in the pelvis. IMPRESSION: Trace free fluid in the pelvis. Otherwise negative pelvic ultrasound. No evidence for torsion. Electronically Signed   By: Jasmine Pang M.D.   On: 12/12/2019 16:22    Procedures  MDM   4:40 PM ultrasound with trace free fluid in the pelvis but otherwise negative, no evidence  of ovarian masses or torsion.  No uterine fibroids noted.  I have been attempting to reach OB/GYN attending through their direct number, but line appears to be busier dysfunctioning.  Discussed with ED secretary who is working on resolving this.  6:10 PM Case discussed with Dr. Elonda Husky with OB/GYN who recommends starting patient on Megace taper, patient should continue this medication until she is seen by OB/GYN and she will follow up with the [facility].  I discussed this plan  with the patient who is in agreement.  At this time feel she is stable for discharge home.  Return precautions discussed.  Patient expresses understanding and agreement with plan.  Discharged home in good condition.  Final diagnoses:  Abnormal vaginal bleeding  Syncope and collapse  Essential hypertension    ED Discharge Orders         Ordered    megestrol (MEGACE) 40 MG tablet     12/12/19 1817             Jacqlyn Larsen, PA-C 12/12/19 1819    Margette Fast, MD 12/13/19 1121

## 2019-12-12 NOTE — Discharge Instructions (Signed)
Take Megace as directed, do not stop this medication until you are seen by OB/GYN.  Make sure you are drinking plenty of fluids.  This medication should help slow down your bleeding.  Call tomorrow morning to schedule follow-up appointment with OB/GYN.  Return if you have worsening bleeding, recurrent syncopal episodes, or any other new or concerning symptoms.

## 2019-12-12 NOTE — ED Provider Notes (Signed)
[facility] EMERGENCY DEPARTMENT Provider Note   CSN: [id number] Arrival date & time: 12/12/19  1209     History Chief Complaint  Patient presents with  . Vaginal Bleeding  . Loss of Consciousness    Melissa Fleming is a 34 y.o. female with PMHx diabetes, HTN, amenorrhea who presents to the ED today complaining of gradual onset, constant, heavy vaginal bleeding x 3 weeks.  She reports that she believes like this approximately 2 times per year however states that for the last couple months she has had bleeding consistently every single month with the heaviest vaginal bleeding being this time.  She is going through 2-3 heavy pads per day. She states that this morning when she woke up she felt dizzy and when she got up she passed out.  She is unsure how long she was out for.  Denies any headache at this time.  She is not anticoagulated.  Reports a history of PCOS however does not currently have an OBGYN. Denies fevers, chills, chest pain, SOB, blurry vision or double vision, abdominal pain, nausea, vomiting, diarrhea, urinary symptoms, or any other associated symptoms.   The history is provided by the patient and medical records.       Past Medical History:  Diagnosis Date  . Amenorrhea   . Diabetes mellitus without complication ([place])   . Hypertension     There are no problems to display for this patient.   History reviewed. No pertinent surgical history.   OB History   No obstetric history on file.     Family History  Problem Relation Age of Onset  . Diabetes Mother   . Hypertension Mother     Social History   Tobacco Use  . Smoking status: Never Smoker  . Smokeless tobacco: Never Used  Substance Use Topics  . Alcohol use: Yes    Comment: occasionally  . Drug use: No    Home Medications Prior to Admission medications   Medication Sig Start Date End Date Taking? Authorizing Provider  acetaminophen (TYLENOL) 500 MG tablet Take 500 mg by mouth  every 6 (six) hours as needed for moderate pain.   Yes [provider]    Allergies    Patient has no known allergies.  Review of Systems   Review of Systems  Constitutional: Negative for chills and fever.  Eyes: Negative for visual disturbance.  Respiratory: Negative for shortness of breath.   Cardiovascular: Negative for chest pain.  Gastrointestinal: Negative for abdominal pain, diarrhea, nausea and vomiting.  Genitourinary: Positive for menstrual problem and vaginal bleeding. Negative for difficulty urinating and dysuria.  Neurological: Positive for dizziness and syncope. Negative for headaches.  All other systems reviewed and are negative.   Physical Exam Updated Vital Signs BP (!) 169/114 (BP Location: Right Arm)   Pulse 79   Temp 98.3 F (36.8 C) (Oral)   Resp 20   LMP 11/21/2019   SpO2 98%   Physical Exam Vitals and nursing note reviewed.  Constitutional:      Appearance: She is not ill-appearing.  HENT:     Head: Normocephalic and atraumatic.  Eyes:     Extraocular Movements: Extraocular movements intact.     Conjunctiva/sclera: Conjunctivae normal.     Pupils: Pupils are equal, round, and reactive to light.  Cardiovascular:     Rate and Rhythm: Normal rate and regular rhythm.     Pulses: Normal pulses.  Pulmonary:     Effort: Pulmonary effort is normal.  Breath sounds: Normal breath sounds. No wheezing, rhonchi or rales.  Abdominal:     Palpations: Abdomen is soft.     Tenderness: There is no abdominal tenderness. There is no right CVA tenderness, left CVA tenderness, guarding or rebound.  Genitourinary:    Comments: Chaperone present for exam. No rashes, lesions, or tenderness to external genitalia. No erythema, injury, or tenderness to vaginal mucosa. Moderate amount of blood in vaginal vault. Right adnexal TTP with bimanual exam. No CMT, cervical friability, or discharge from cervical os. Cervical os is closed. Uterus non-deviated, mobile,  nonTTP, and without enlargement.  Musculoskeletal:     Cervical back: Neck supple.  Skin:    General: Skin is warm and dry.  Neurological:     General: No focal deficit present.     Mental Status: She is alert and oriented to person, place, and time. Mental status is at baseline.     Cranial Nerves: No cranial nerve deficit.     ED Results / Procedures / Treatments   Labs (all labs ordered are listed, but only abnormal results are displayed) Labs Reviewed  WET PREP, GENITAL - Abnormal; Notable for the following components:      Result Value   WBC, Wet Prep HPF POC FEW (*)    All other components within normal limits  BASIC METABOLIC PANEL - Abnormal; Notable for the following components:   Glucose, Bld 105 (*)    Calcium 8.8 (*)    All other components within normal limits  CBC - Abnormal; Notable for the following components:   Platelets 418 (*)    All other components within normal limits  URINALYSIS, ROUTINE W REFLEX MICROSCOPIC - Abnormal; Notable for the following components:   Color, Urine AMBER (*)    APPearance CLOUDY (*)    Hgb urine dipstick LARGE (*)    Protein, ur 100 (*)    RBC / HPF >50 (*)    All other components within normal limits  I-STAT BETA HCG BLOOD, ED ([facility], [facility], [facility] ONLY)  GC/CHLAMYDIA PROBE AMP ([place]) NOT AT [facility]    EKG EKG Interpretation  Date/Time:  Monday December 12 2019 12:21:48 EST Ventricular Rate:  79 PR Interval:  158 QRS Duration: 72 QT Interval:  368 QTC Calculation: 421 R Axis:   58 Text Interpretation: Normal sinus rhythm Confirmed by Cathren Laine ([id number]) on 12/12/2019 1:32:05 PM   Radiology No results found.  Procedures Procedures (including critical care time)  Medications Ordered in ED Medications  sodium chloride flush (NS) 0.9 % injection 3 mL (has no administration in time range)    ED Course  I have reviewed the triage vital signs and the nursing notes.  Pertinent labs & imaging results that were available  during my care of the patient were reviewed by me and considered in my medical decision making (see chart for details).  Clinical Course as of Dec 12 1551  Mon Dec 12, 2019  1311 I-stat hCG, quantitative: <5.0 [MV]    Clinical Course User Index [MV] Tanda Rockers, [place]   65 yaer old female presenting to the ED with vaginal bleeding x 3 weeks and syncopal episode this morning. On arrival to the ED pt is afebrile, nontachycardic, and nontachypneic. Blood pressure elevated. Pt with history of HTN however does not appear to be on any medications at this time. Given HTN and syncopal episode will obtain screening labs including CBC and BMP. No focal neuro deficits on exam today. Will perform pelvic exam  as well to determine amount of vaginal bleeding. Will obtain orthostatics as well given syncopal episode.   Pelvic exam performed. Pt with right sided adnexal tenderness. She does have brisk amount of blood coming from Os. Given TTP will obtain pelvic ultrasound to ensure there is no concern for hemorrhagic cyst.   CBC without leukocytosis. Hgb stable at 12.8. This appears to be around pt's baseline.  BMP with elevated glucose 105 and calcium 8.8. No other abnormalities.  Beta hcg negative.   Orthostatics within normal limits.   Wet prep with few WBC however no signs of trich, yeast, or BV.   At shift change case signed out to [facility], PA-C pending urine and ultrasound. If no acute findings pt to be discharged with OBGYN follow up. Given amount of bleeding may benefit from initiation of OCPs.   This note was prepared using Dragon voice recognition software and may include unintentional dictation errors due to the inherent limitations of voice recognition software.  MDM Rules/Calculators/A&P                       Final Clinical Impression(s) / ED Diagnoses Final diagnoses:  Abnormal vaginal bleeding  Syncope and collapse  Essential hypertension    Rx / DC Orders ED Discharge Orders     None       Tanda Rockers, PA-C 12/12/19 1553    Cathren Laine, MD 12/13/19 [phone]

## 2019-12-12 NOTE — ED Triage Notes (Signed)
Pt reports hx PCOS, c/o heavy vaginal bleeding x 2 weeks, states she passed out this am. A&O x 4 at this time.

## 2019-12-13 LAB — GC/CHLAMYDIA PROBE AMP (~~LOC~~) NOT AT ARMC
Chlamydia: NEGATIVE
Neisseria Gonorrhea: NEGATIVE

## 2020-01-25 DIAGNOSIS — O10919 Unspecified pre-existing hypertension complicating pregnancy, unspecified trimester: Secondary | ICD-10-CM | POA: Insufficient documentation

## 2020-01-25 DIAGNOSIS — E282 Polycystic ovarian syndrome: Secondary | ICD-10-CM | POA: Insufficient documentation

## 2020-01-25 DIAGNOSIS — I10 Essential (primary) hypertension: Secondary | ICD-10-CM | POA: Insufficient documentation

## 2020-01-25 HISTORY — DX: Polycystic ovarian syndrome: E28.2

## 2020-03-07 DIAGNOSIS — D473 Essential (hemorrhagic) thrombocythemia: Secondary | ICD-10-CM | POA: Insufficient documentation

## 2020-03-29 ENCOUNTER — Encounter (HOSPITAL_COMMUNITY): Payer: Self-pay | Admitting: *Deleted

## 2020-03-29 ENCOUNTER — Other Ambulatory Visit: Payer: Self-pay

## 2020-03-29 ENCOUNTER — Emergency Department (HOSPITAL_COMMUNITY)
Admission: EM | Admit: 2020-03-29 | Discharge: 2020-03-29 | Disposition: A | Discharge status: Home / Self Care | Payer: 59 | Attending: Emergency Medicine | Admitting: Emergency Medicine

## 2020-03-29 DIAGNOSIS — R1032 Left lower quadrant pain: Secondary | ICD-10-CM | POA: Diagnosis not present

## 2020-03-29 DIAGNOSIS — Z5321 Procedure and treatment not carried out due to patient leaving prior to being seen by health care provider: Secondary | ICD-10-CM | POA: Diagnosis not present

## 2020-03-29 LAB — COMPREHENSIVE METABOLIC PANEL
ALT: 22 U/L (ref 0–44)
AST: 18 U/L (ref 15–41)
Albumin: 4.2 g/dL (ref 3.5–5.0)
Alkaline Phosphatase: 59 U/L (ref 38–126)
Anion gap: 9 (ref 5–15)
BUN: 16 mg/dL (ref 6–20)
CO2: 25 mmol/L (ref 22–32)
Calcium: 9.2 mg/dL (ref 8.9–10.3)
Chloride: 102 mmol/L (ref 98–111)
Creatinine, Ser: 0.72 mg/dL (ref 0.44–1.00)
GFR calc Af Amer: >60 mL/min (ref >60)
GFR calc non Af Amer: >60 mL/min (ref >60)
Glucose, Bld: 94 mg/dL (ref 70–99)
Potassium: 3.4 mmol/L — ABNORMAL LOW (ref 3.5–5.1)
Sodium: 136 mmol/L (ref 135–145)
Total Bilirubin: 0.9 mg/dL (ref 0.3–1.2)
Total Protein: 8.1 g/dL (ref 6.5–8.1)

## 2020-03-29 LAB — URINALYSIS, ROUTINE W REFLEX MICROSCOPIC
Bacteria, UA: NONE SEEN
Bilirubin Urine: NEGATIVE
Glucose, UA: NEGATIVE mg/dL
Hgb urine dipstick: NEGATIVE
Ketones, ur: NEGATIVE mg/dL
Leukocytes,Ua: NEGATIVE
Nitrite: NEGATIVE
Protein, ur: 30 mg/dL — AB
Specific Gravity, Urine: 1.029 (ref 1.005–1.030)
pH: 5 (ref 5.0–8.0)

## 2020-03-29 LAB — I-STAT BETA HCG BLOOD, ED (MC, WL, AP ONLY): I-stat hCG, quantitative: <5 m[IU]/mL (ref <5)

## 2020-03-29 LAB — CBC
HCT: 39.1 % (ref 36.0–46.0)
Hemoglobin: 12.9 g/dL (ref 12.0–15.0)
MCH: 28.9 pg (ref 26.0–34.0)
MCHC: 33 g/dL (ref 30.0–36.0)
MCV: 87.7 fL (ref 80.0–100.0)
Platelets: 479 10*3/uL — ABNORMAL HIGH (ref 150–400)
RBC: 4.46 MIL/uL (ref 3.87–5.11)
RDW: 13.2 % (ref 11.5–15.5)
WBC: 8.5 10*3/uL (ref 4.0–10.5)
nRBC: 0 % (ref 0.0–0.2)

## 2020-03-29 LAB — LIPASE, BLOOD: Lipase: 27 U/L (ref 11–51)

## 2020-03-29 MED ORDER — SODIUM CHLORIDE 0.9% FLUSH: 3.0000 mL | Freq: Once | INTRAVENOUS | Status: DC

## 2020-03-29 NOTE — ED Triage Notes (Signed)
Pt reports having IUD placed in April and it is causing severe lower abd pain, more specifically in LLQ. Pt went to pcp today to have it removed and they were unable to locate it.

## 2020-03-29 NOTE — ED Notes (Signed)
Called pts name 3x to be roomed with no response.  

## 2020-03-30 ENCOUNTER — Other Ambulatory Visit: Payer: Self-pay | Admitting: Obstetrics and Gynecology

## 2020-03-30 DIAGNOSIS — T8332XA Displacement of intrauterine contraceptive device, initial encounter: Secondary | ICD-10-CM

## 2020-04-04 ENCOUNTER — Ambulatory Visit
Admission: RE | Admit: 2020-04-04 | Discharge: 2020-04-04 | Disposition: A | Discharge status: Home / Self Care | Payer: 59 | Source: Ambulatory Visit | Attending: Obstetrics and Gynecology | Admitting: Obstetrics and Gynecology

## 2020-04-04 ENCOUNTER — Other Ambulatory Visit: Payer: 59

## 2020-04-04 DIAGNOSIS — T8332XA Displacement of intrauterine contraceptive device, initial encounter: Secondary | ICD-10-CM

## 2020-09-05 ENCOUNTER — Ambulatory Visit (HOSPITAL_COMMUNITY)
Admission: EM | Admit: 2020-09-05 | Discharge: 2020-09-05 | Disposition: A | Discharge status: Home / Self Care | Payer: 59 | Attending: Family Medicine | Admitting: Family Medicine

## 2020-09-05 ENCOUNTER — Ambulatory Visit (INDEPENDENT_AMBULATORY_CARE_PROVIDER_SITE_OTHER): Payer: 59

## 2020-09-05 ENCOUNTER — Other Ambulatory Visit: Payer: Self-pay

## 2020-09-05 ENCOUNTER — Encounter (HOSPITAL_COMMUNITY): Payer: Self-pay

## 2020-09-05 DIAGNOSIS — S299XXA Unspecified injury of thorax, initial encounter: Secondary | ICD-10-CM | POA: Diagnosis not present

## 2020-09-05 DIAGNOSIS — R079 Chest pain, unspecified: Secondary | ICD-10-CM

## 2020-09-05 DIAGNOSIS — S2242XA Multiple fractures of ribs, left side, initial encounter for closed fracture: Secondary | ICD-10-CM

## 2020-09-05 MED ORDER — IBUPROFEN 800 MG PO TABS
800.0000 mg | ORAL_TABLET | Freq: Three times a day (TID) | ORAL | 0 refills | Status: DC
Start: 2020-09-05 — End: 2021-06-22

## 2020-09-05 MED ORDER — HYDROCODONE-ACETAMINOPHEN 5-325 MG PO TABS: 1.0000 | ORAL_TABLET | Freq: Four times a day (QID) | ORAL | 0 refills | Status: DC | PRN

## 2020-09-05 NOTE — ED Triage Notes (Signed)
Pt presents with left side rib pain after a fall downstairs X 3 days ago.

## 2020-09-05 NOTE — Discharge Instructions (Addendum)

## 2020-09-05 NOTE — ED Provider Notes (Signed)
[facility] CARE CENTER   [id number] 09/05/20 Arrival Time: 0919  ASSESSMENT & PLAN:  1. Closed fracture of multiple ribs of left side, initial encounter     I have personally viewed the imaging studies ordered this visit. Left 9/10 rib fractures. No pneumothorax.  Begin: Meds ordered this encounter  Medications  . ibuprofen (ADVIL) 800 MG tablet    Sig: Take 1 tablet (800 mg total) by mouth 3 (three) times daily with meals.    Dispense:  21 tablet    Refill:  0  . HYDROcodone-acetaminophen (NORCO/VICODIN) 5-325 MG tablet    Sig: Take 1 tablet by mouth every 6 (six) hours as needed for moderate pain or severe pain.    Dispense:  20 tablet    Refill:  0   Importance of deep breaths discussed.  Reviewed expectations re: course of current medical issues. Questions answered. Outlined signs and symptoms indicating need for more acute intervention. Patient verbalized understanding. After Visit Summary given.   SUBJECTIVE:  History from: patient. Melissa Fleming is a 34 y.o. female who presents with complaint of persistent left rib pain s/p fall on stairs 3 d ago. No SOB but painful with deep breaths. No CP. Ambulatory without difficulty. OTC analgesics with mild help. No abd or back pain.  Social History   Tobacco Use  Smoking Status Never Smoker  Smokeless Tobacco Never Used   Social History   Substance and Sexual Activity  Alcohol Use Yes   Comment: occasionally    OBJECTIVE:  Vitals:   09/05/20 1010  BP: (!) 160/97  Pulse: 87  Resp: 17  Temp: 98.3 F (36.8 C)  TempSrc: Oral  SpO2: 99%    General appearance: alert, oriented, no acute distress but appears uncomfortable Eyes: PERRLA; EOMI; conjunctivae normal HENT: normocephalic; atraumatic Neck: supple with FROM Lungs: without labored respirations; speaks full sentences without difficulty; CTAB Heart: regular rate and rhythm without murmer Chest Wall: with significant tenderness to palpation over L lower  ribs; no bruising or gross abnormalities Abdomen: soft Extremities: without edema; without calf swelling or tenderness; symmetrical without gross deformities Skin: warm and dry; without rash or lesions Neuro: normal gait Psychological: alert and cooperative; normal mood and affect  Imaging: DG Ribs Unilateral W/Chest Left  Result Date: 09/05/2020 CLINICAL DATA:  Trauma.  Chest pain EXAM: LEFT RIBS AND CHEST - 3+ VIEW COMPARISON:  11/14/2018 FINDINGS: Acute fractures left ninth and tenth ribs with mild displacement. Heart size upper normal. Vascularity normal. Lungs are clear without infiltrate effusion. No pneumothorax. IMPRESSION: Mildly displaced fracture left ninth and tenth ribs laterally. Electronically Signed   By: Marlan Palau M.D.   On: 09/05/2020 10:40     No Known Allergies  Past Medical History:  Diagnosis Date  . Amenorrhea   . Diabetes mellitus without complication (HCC)   . Hypertension    Social History   Socioeconomic History  . Marital status: Single    Spouse name: Not on file  . Number of children: Not on file  . Years of education: Not on file  . Highest education level: Not on file  Occupational History  . Not on file  Tobacco Use  . Smoking status: Never Smoker  . Smokeless tobacco: Never Used  Vaping Use  . Vaping Use: Never used  Substance and Sexual Activity  . Alcohol use: Yes    Comment: occasionally  . Drug use: No  . Sexual activity: Yes    Birth control/protection: Condom  Other Topics Concern  .  Not on file  Social History Narrative  . Not on file   Social Determinants of Health   Financial Resource Strain:   . Difficulty of Paying Living Expenses: Not on file  Food Insecurity:   . Worried About Programme researcher, broadcasting/film/video in the Last Year: Not on file  . Ran Out of Food in the Last Year: Not on file  Transportation Needs:   . Lack of Transportation (Medical): Not on file  . Lack of Transportation (Non-Medical): Not on file  Physical  Activity:   . Days of Exercise per Week: Not on file  . Minutes of Exercise per Session: Not on file  Stress:   . Feeling of Stress : Not on file  Social Connections:   . Frequency of Communication with Friends and Family: Not on file  . Frequency of Social Gatherings with Friends and Family: Not on file  . Attends Religious Services: Not on file  . Active Member of Clubs or Organizations: Not on file  . Attends Banker Meetings: Not on file  . Marital Status: Not on file  Intimate Partner Violence:   . Fear of Current or Ex-Partner: Not on file  . Emotionally Abused: Not on file  . Physically Abused: Not on file  . Sexually Abused: Not on file   Family History  Problem Relation Age of Onset  . Diabetes Mother   . Hypertension Mother    History reviewed. No pertinent surgical history.   Mardella Layman, MD 09/05/20 1251

## 2021-06-22 ENCOUNTER — Encounter (HOSPITAL_COMMUNITY): Payer: Self-pay | Admitting: Emergency Medicine

## 2021-06-22 ENCOUNTER — Emergency Department (HOSPITAL_COMMUNITY): Payer: 59

## 2021-06-22 ENCOUNTER — Other Ambulatory Visit: Payer: Self-pay

## 2021-06-22 ENCOUNTER — Emergency Department (HOSPITAL_COMMUNITY)
Admission: EM | Admit: 2021-06-22 | Discharge: 2021-06-22 | Disposition: A | Discharge status: Home / Self Care | Payer: 59 | Attending: Emergency Medicine | Admitting: Emergency Medicine

## 2021-06-22 DIAGNOSIS — W11XXXA Fall on and from ladder, initial encounter: Secondary | ICD-10-CM | POA: Insufficient documentation

## 2021-06-22 DIAGNOSIS — E119 Type 2 diabetes mellitus without complications: Secondary | ICD-10-CM | POA: Insufficient documentation

## 2021-06-22 DIAGNOSIS — M79671 Pain in right foot: Secondary | ICD-10-CM | POA: Diagnosis not present

## 2021-06-22 DIAGNOSIS — M25571 Pain in right ankle and joints of right foot: Secondary | ICD-10-CM | POA: Diagnosis not present

## 2021-06-22 DIAGNOSIS — I10 Essential (primary) hypertension: Secondary | ICD-10-CM | POA: Insufficient documentation

## 2021-06-22 MED ORDER — IBUPROFEN 600 MG PO TABS
600.0000 mg | ORAL_TABLET | Freq: Four times a day (QID) | ORAL | 0 refills | Status: DC | PRN
Start: 2021-06-22 — End: 2022-01-20

## 2021-06-22 MED ORDER — HYDROCODONE-ACETAMINOPHEN 5-325 MG PO TABS
1.0000 | ORAL_TABLET | Freq: Once | ORAL | Status: AC
  Administered 2021-06-22: 1 via ORAL
  Filled 2021-06-22: qty 1

## 2021-06-22 MED ORDER — HYDROCODONE-ACETAMINOPHEN 5-325 MG PO TABS: 1.0000 | ORAL_TABLET | Freq: Four times a day (QID) | ORAL | 0 refills | Status: DC | PRN

## 2021-06-22 NOTE — ED Provider Notes (Signed)
[facility] EMERGENCY DEPARTMENT Provider Note   CSN: [id number] Arrival date & time: 06/22/21  1855     History Chief Complaint  Patient presents with   Ankle Pain    Melissa Fleming is a 35 y.o. female.  Patient is a 35 yo female present for right foot pain. Patient states she was on the third step of a step-ladder when it gave out from under here. Admits to fall without head trauma. Admits to pain in right foot with difficultly standing or ambulating due to pain.   The history is provided by the patient. No language interpreter was used.  Ankle Pain Associated symptoms: no fever       Past Medical History:  Diagnosis Date   Amenorrhea    Diabetes mellitus without complication (HCC)    Hypertension     There are no problems to display for this patient.   History reviewed. No pertinent surgical history.   OB History   No obstetric history on file.     Family History  Problem Relation Age of Onset   Diabetes Mother    Hypertension Mother     Social History   Tobacco Use   Smoking status: Never   Smokeless tobacco: Never  Vaping Use   Vaping Use: Never used  Substance Use Topics   Alcohol use: Yes    Comment: occasionally   Drug use: No    Home Medications Prior to Admission medications   Medication Sig Start Date End Date Taking? Authorizing Provider  HYDROcodone-acetaminophen (NORCO) 5-325 MG tablet Take 1 tablet by mouth every 6 (six) hours as needed for severe pain. 06/22/21  Yes Edwin Dada P, DO  ibuprofen (ADVIL) 600 MG tablet Take 1 tablet (600 mg total) by mouth every 6 (six) hours as needed for mild pain. 06/22/21  Yes Edwin Dada P, DO  megestrol (MEGACE) 40 MG tablet Take 3 tablets daily for 5 days, then 2 tablets daily for 5 days, and then 1 tablet daily until you are seen by OB/GYN 12/12/19   Dartha Lodge, PA-C    Allergies    Patient has no known allergies.  Review of Systems   Review of Systems  Constitutional:   Negative for chills and fever.  Respiratory:  Negative for chest tightness and shortness of breath.   Cardiovascular:  Negative for chest pain and palpitations.  Musculoskeletal:        Right foot pain   Skin:  Negative for color change and wound.  Neurological:  Negative for weakness and numbness.   Physical Exam Updated Vital Signs BP (!) 161/96   Pulse 88   Temp 98.8 F (37.1 C) (Oral)   Resp 16   SpO2 99%   Physical Exam Vitals reviewed.  Constitutional:      Appearance: Normal appearance.  HENT:     Head: Normocephalic and atraumatic.  Cardiovascular:     Rate and Rhythm: Normal rate and regular rhythm.     Pulses:          Dorsalis pedis pulses are 2+ on the right side and 2+ on the left side.  Pulmonary:     Effort: Pulmonary effort is normal. No respiratory distress.  Musculoskeletal:       Feet:  Feet:     Comments: No swelling, open wounds, bleeding, or erythema  Skin:    Capillary Refill: Capillary refill takes less than 2 seconds.  Neurological:     General: No focal deficit present.  Mental Status: She is alert and oriented to person, place, and time.     Sensory: Sensation is intact.     Motor: Motor function is intact.    ED Results / Procedures / Treatments   Labs (all labs ordered are listed, but only abnormal results are displayed) Labs Reviewed - No data to display  EKG None  Radiology DG Ankle Complete Right  Result Date: 06/22/2021 CLINICAL DATA:  Fall from step ladder, ankle pain EXAM: RIGHT ANKLE - COMPLETE 3+ VIEW COMPARISON:  None. FINDINGS: There is no evidence of fracture, dislocation, or joint effusion. There is no evidence of arthropathy or other focal bone abnormality. Soft tissue edema about the anterior ankle. IMPRESSION: No fracture or dislocation of the right ankle. Joint spaces are preserved. Soft tissue edema about the anterior ankle. Electronically Signed   By: Lauralyn Primes M.D.   On: 06/22/2021 19:27     Procedures Procedures   Medications Ordered in ED Medications  HYDROcodone-acetaminophen (NORCO/VICODIN) 5-325 MG per tablet 1 tablet (1 tablet Oral Given 06/22/21 2010)    ED Course  I have reviewed the triage vital signs and the nursing notes.  Pertinent labs & imaging results that were available during my care of the patient were reviewed by me and considered in my medical decision making (see chart for details).    MDM Rules/Calculators/A&P                          9:17 PM 35 yo female present for right foot pain after falling off ladder on third step. Pt is Aox3, no acute distress, afebrile, with stable vitals. Physicial exam demonstrates tenderness to dorsum of right foot. Foot neurovascularly intact. Xray demonstrates no fractures. norco given for pain. Ace wrap applied and crutches provided. Recommendations for rest, elevation, and ice.   Patient in no distress and overall condition improved here in the ED. Detailed discussions were had with the patient regarding current findings, and need for close f/u with orthopedic surgery if pain continues in the next 5-7 days. The patient has been instructed to return immediately if the symptoms worsen in any way for re-evaluation. Patient verbalized understanding and is in agreement with current care plan. All questions answered prior to discharge.      Final Clinical Impression(s) / ED Diagnoses Final diagnoses:  Right foot pain    Rx / DC Orders ED Discharge Orders          Ordered    HYDROcodone-acetaminophen (NORCO) 5-325 MG tablet  Every 6 hours PRN        06/22/21 2116    ibuprofen (ADVIL) 600 MG tablet  Every 6 hours PRN        06/22/21 2116    DME Crutches        06/22/21 2116             Franne Forts, DO 06/22/21 2117

## 2021-06-22 NOTE — ED Notes (Signed)
Dc instructions reviewed with pt. Pt verbalized understanding. Pt Dc ?

## 2021-06-22 NOTE — Discharge Instructions (Addendum)
Recommendations for rest, elevation, and ice.  Motrin for moderate pain Norco for severe pain Crutches as needed

## 2021-06-22 NOTE — ED Triage Notes (Signed)
Pt c/o right ankle pain after falling earlier today.

## 2021-12-12 ENCOUNTER — Emergency Department (HOSPITAL_COMMUNITY)
Admission: EM | Admit: 2021-12-12 | Discharge: 2021-12-12 | Disposition: A | Discharge status: Home / Self Care | Payer: 59 | Attending: Emergency Medicine | Admitting: Emergency Medicine

## 2021-12-12 ENCOUNTER — Encounter (HOSPITAL_COMMUNITY): Payer: Self-pay | Admitting: Emergency Medicine

## 2021-12-12 ENCOUNTER — Other Ambulatory Visit: Payer: Self-pay

## 2021-12-12 DIAGNOSIS — I10 Essential (primary) hypertension: Secondary | ICD-10-CM | POA: Diagnosis not present

## 2021-12-12 DIAGNOSIS — R519 Headache, unspecified: Secondary | ICD-10-CM

## 2021-12-12 DIAGNOSIS — E119 Type 2 diabetes mellitus without complications: Secondary | ICD-10-CM | POA: Diagnosis not present

## 2021-12-12 LAB — CBC WITH DIFFERENTIAL/PLATELET
Abs Immature Granulocytes: 0.01 10*3/uL (ref 0.00–0.07)
Basophils Absolute: 0 10*3/uL (ref 0.0–0.1)
Basophils Relative: 1 %
Eosinophils Absolute: 0.1 10*3/uL (ref 0.0–0.5)
Eosinophils Relative: 1 %
HCT: 40.5 % (ref 36.0–46.0)
Hemoglobin: 13.4 g/dL (ref 12.0–15.0)
Immature Granulocytes: 0 %
Lymphocytes Relative: 30 %
Lymphs Abs: 2.1 10*3/uL (ref 0.7–4.0)
MCH: 28.7 pg (ref 26.0–34.0)
MCHC: 33.1 g/dL (ref 30.0–36.0)
MCV: 86.7 fL (ref 80.0–100.0)
Monocytes Absolute: 0.7 10*3/uL (ref 0.1–1.0)
Monocytes Relative: 10 %
Neutro Abs: 4 10*3/uL (ref 1.7–7.7)
Neutrophils Relative %: 58 %
Platelets: 444 10*3/uL — ABNORMAL HIGH (ref 150–400)
RBC: 4.67 MIL/uL (ref 3.87–5.11)
RDW: 13.2 % (ref 11.5–15.5)
WBC: 6.9 10*3/uL (ref 4.0–10.5)
nRBC: 0 % (ref 0.0–0.2)

## 2021-12-12 LAB — COMPREHENSIVE METABOLIC PANEL
ALT: 19 U/L (ref 0–44)
AST: 19 U/L (ref 15–41)
Albumin: 4.2 g/dL (ref 3.5–5.0)
Alkaline Phosphatase: 65 U/L (ref 38–126)
Anion gap: 6 (ref 5–15)
BUN: 12 mg/dL (ref 6–20)
CO2: 26 mmol/L (ref 22–32)
Calcium: 9.2 mg/dL (ref 8.9–10.3)
Chloride: 103 mmol/L (ref 98–111)
Creatinine, Ser: 0.59 mg/dL (ref 0.44–1.00)
GFR, Estimated: >60 mL/min (ref >60)
Glucose, Bld: 102 mg/dL — ABNORMAL HIGH (ref 70–99)
Potassium: 3.9 mmol/L (ref 3.5–5.1)
Sodium: 135 mmol/L (ref 135–145)
Total Bilirubin: 1 mg/dL (ref 0.3–1.2)
Total Protein: 8.2 g/dL — ABNORMAL HIGH (ref 6.5–8.1)

## 2021-12-12 LAB — I-STAT BETA HCG BLOOD, ED (MC, WL, AP ONLY): I-stat hCG, quantitative: 53.8 m[IU]/mL — ABNORMAL HIGH (ref <5)

## 2021-12-12 LAB — HCG, QUANTITATIVE, PREGNANCY: hCG, Beta Chain, Quant, S: 64 m[IU]/mL — ABNORMAL HIGH (ref <5)

## 2021-12-12 MED ORDER — LABETALOL HCL 100 MG PO TABS
100.0000 mg | ORAL_TABLET | Freq: Once | ORAL | Status: AC
  Administered 2021-12-12: 14:00:00 100 mg via ORAL
  Filled 2021-12-12: qty 1

## 2021-12-12 MED ORDER — LABETALOL HCL 100 MG PO TABS: 100.0000 mg | ORAL_TABLET | Freq: Two times a day (BID) | ORAL | 0 refills | Status: DC

## 2021-12-12 NOTE — ED Notes (Signed)
Labs obtained and sent to lab.

## 2021-12-12 NOTE — Discharge Instructions (Signed)
Your blood work today was reassuring.  Based off of your hCG quant your pregnancy is about [redacted] weeks along.  Please stop taking losartan.  This could be harmful during pregnancy.  You received a dose of labetalol in the emergency room.  I have also sent in a prescription to the pharmacy for you.  Take this medicine twice daily.  Have also given you follow-up information for women's health.  Please call and schedule an appointment with them.  Your medication may need to be adjusted at follow-up. ?

## 2021-12-12 NOTE — ED Triage Notes (Signed)
Patient reports recently finding out she was pregnant, estimated 6-[redacted] weeks along. Has a hx of HTN, is on losartan, taking as prescribed. Reports intermittent HA for the past week.  ?

## 2021-12-12 NOTE — ED Provider Triage Note (Signed)
Emergency Medicine Provider Triage Evaluation Note ? ?Melissa Fleming , a 36 y.o. female  was evaluated in triage.  Pt complains of headache, and elevated blood pressure over the past few weeks.  Patient has history of hypertension and has been taking losartan.  She states prior to finding out she was pregnant she occasionally had elevated blood pressure but primarily was controlled.  She states lately her blood pressure is constantly elevated as well as she is having a constant headache.  She also endorses light vaginal bleeding which has been ongoing for a while.  [facility] and was told this was normal.  Denies abdominal pain.  Leave she is around 6 to [redacted] weeks pregnant. ? ?Review of Systems  ?Positive: As above ?Negative: As above ? ?Physical Exam  ?BP (!) 166/113   Pulse (!) 102   Temp 98.2 ?F (36.8 ?C) (Oral)   Resp 18   Ht 5' (1.524 m)   Wt 91.6 kg   SpO2 100%   BMI 39.45 kg/m?  ?Gen:   Awake, no distress   ?Resp:  Normal effort  ?MSK:   Moves extremities without difficulty  ?Other:  Abdomen benign and nondistended. ? ?Medical Decision Making  ?Medically screening exam initiated at 11:44 AM.  Appropriate orders placed.  Melissa Fleming was informed that the remainder of the evaluation will be completed by another provider, this initial triage assessment does not replace that evaluation, and the importance of remaining in the ED until their evaluation is complete. ? ? ?  ?Marita Kansas, PA-C ?12/12/21 1146 ? ?

## 2021-12-12 NOTE — ED Provider Notes (Signed)
?[facility]-EMERGENCY DEPT ?Provider Note ? ? ?CSN: [id number] ?Arrival date & time: 12/12/21  1107 ? ?  ? ?History ? ?Chief Complaint  ?Patient presents with  ? Hypertension  ? Headache  ? ? ?Melissa Fleming is a 36 y.o. female. ? ?36 year old female presents today for evaluation of elevated blood pressure and headache of multiple week duration.  Patient also states she may be 6 to [redacted] weeks pregnant.  She also has light vaginal spotting which she was told was normal when she was evaluated at [facility].  Patient is on losartan chronically.  She reports she has been taking this since she finished she was pregnant as well.  She denies chest pain, palpitations, lightheadedness, diaphoresis, dizziness, visual change.  She denies abdominal pain.  She denies vaginal discharge other than light vaginal spotting. ? ?The history is provided by the patient. No language interpreter was used.  ?Headache ?Associated symptoms: no fever, no nausea, no vomiting and no weakness   ? ?  ? ?Home Medications ?Prior to Admission medications   ?Medication Sig Start Date End Date Taking? Authorizing Provider  ?HYDROcodone-acetaminophen (NORCO) 5-325 MG tablet Take 1 tablet by mouth every 6 (six) hours as needed for severe pain. 06/22/21   Franne Forts, DO  ?ibuprofen (ADVIL) 600 MG tablet Take 1 tablet (600 mg total) by mouth every 6 (six) hours as needed for mild pain. 06/22/21   Franne Forts, DO  ?megestrol (MEGACE) 40 MG tablet Take 3 tablets daily for 5 days, then 2 tablets daily for 5 days, and then 1 tablet daily until you are seen by OB/GYN 12/12/19   Dartha Lodge, PA-C  ?   ? ?Allergies    ?Patient has no known allergies.   ? ?Review of Systems   ?Review of Systems  ?Constitutional:  Negative for chills and fever.  ?Eyes:  Negative for visual disturbance.  ?Respiratory:  Negative for shortness of breath.   ?Cardiovascular:  Negative for chest pain and palpitations.  ?Gastrointestinal:  Negative for nausea and  vomiting.  ?Neurological:  Negative for weakness.  ?All other systems reviewed and are negative. ? ?Physical Exam ?Updated Vital Signs ?BP (!) 166/113   Pulse (!) 102   Temp 98.2 ?F (36.8 ?C) (Oral)   Resp 18   Ht 5' (1.524 m)   Wt 91.6 kg   SpO2 100%   BMI 39.45 kg/m?  ?Physical Exam ?Vitals and nursing note reviewed.  ?Constitutional:   ?   General: She is not in acute distress. ?   Appearance: Normal appearance. She is not ill-appearing.  ?HENT:  ?   Head: Normocephalic and atraumatic.  ?   Nose: Nose normal.  ?Eyes:  ?   General: No scleral icterus. ?   Extraocular Movements: Extraocular movements intact.  ?   Conjunctiva/sclera: Conjunctivae normal.  ?Cardiovascular:  ?   Rate and Rhythm: Normal rate and regular rhythm.  ?   Pulses: Normal pulses.  ?   Heart sounds: Normal heart sounds.  ?Pulmonary:  ?   Effort: Pulmonary effort is normal. No respiratory distress.  ?   Breath sounds: Normal breath sounds. No wheezing or rales.  ?Abdominal:  ?   General: There is no distension.  ?   Tenderness: There is no abdominal tenderness.  ?Musculoskeletal:     ?   General: Normal range of motion.  ?   Cervical back: Normal range of motion.  ?Skin: ?   General: Skin is warm and  dry.  ?Neurological:  ?   General: No focal deficit present.  ?   Mental Status: She is alert. Mental status is at baseline.  ? ? ?ED Results / Procedures / Treatments   ?Labs ?(all labs ordered are listed, but only abnormal results are displayed) ?Labs Reviewed  ?CBC WITH DIFFERENTIAL/PLATELET - Abnormal; Notable for the following components:  ?    Result Value  ? Platelets 444 (*)   ? All other components within normal limits  ?COMPREHENSIVE METABOLIC PANEL  ?HCG, QUANTITATIVE, PREGNANCY  ?I-STAT BETA HCG BLOOD, ED ([facility], [facility], [facility] ONLY)  ? ? ?EKG ?None ? ?Radiology ?No results found. ? ?Procedures ?Procedures  ? ? ?Medications Ordered in ED ?Medications - No data to display ? ?ED Course/ Medical Decision Making/ A&P ?  ?                         ?Medical Decision Making ?Amount and/or Complexity of Data Reviewed ?Labs: ordered. ? ?Risk ?Prescription drug management. ? ? ?Medical Decision Making / ED Course ? ? ?This patient presents to the ED for concern of headache, elevated blood pressure, this involves an extensive number of treatment options, and is a complaint that carries with it a high risk of complications and morbidity.  The differential diagnosis includes hypertensive emergency, CVA, migraine, tension headache, medication noncompliance ? ?MDM: ?36 year old female presents today for evaluation of elevated blood pressure and headache of multiple week duration.  Patient's last menstrual cycle was end of January.  She states she recently had a positive home pregnancy test and then again 2 days ago at [facility].  She states last week she still also started to have vaginal spotting.  She was told this was normal at [facility].  CBC without leukocytosis or anemia.  CMP with glucose of 102 otherwise unremarkable.  hCG quantitative is 64.  This puts her at about 2 weeks of pregnancy.  She is without significant vaginal bleeding or abdominal pain.  Considered OB ultrasound however given quant of 64 unlikely to be of much utility especially without abdominal pain and low concern for ectopic pregnancy.  Discussed importance of follow-up and establishing OB care and having repeat quant done at that time.  Discussed return precautions.  She is without chest pain, shortness of breath, visual change, or other signs of endorgan damage.  Patient voices understanding and is in agreement with plan.  Patient has also been taking losartan.  Discussed that this needs to be stopped and is can be harmful in pregnancy.  MAU APP who recommends transitioning losartan to labetalol 100 mg twice daily.  First dose given in the emergency room.  Patient is appropriate for discharge.  Discharged in stable condition. ? ?Lab Tests: ?-I ordered, reviewed, and interpreted labs.   ?The  pertinent results include:   ?Labs Reviewed  ?CBC WITH DIFFERENTIAL/PLATELET - Abnormal; Notable for the following components:  ?    Result Value  ? Platelets 444 (*)   ? All other components within normal limits  ?COMPREHENSIVE METABOLIC PANEL - Abnormal; Notable for the following components:  ? Glucose, Bld 102 (*)   ? Total Protein 8.2 (*)   ? All other components within normal limits  ?HCG, QUANTITATIVE, PREGNANCY - Abnormal; Notable for the following components:  ? hCG, Beta Chain, Quant, S 64 (*)   ? All other components within normal limits  ?I-STAT BETA HCG BLOOD, ED ([facility], [facility], [facility] ONLY) - Abnormal; Notable for  the following components:  ? I-stat hCG, quantitative 53.8 (*)   ? All other components within normal limits  ?  ? ? ?EKG ? EKG Interpretation ? ?Date/Time:    ?Ventricular Rate:    ?PR Interval:    ?QRS Duration:   ?QT Interval:    ?QTC Calculation:   ?R Axis:     ?Text Interpretation:   ?  ? ?  ? ? ?Medicines ordered and prescription drug management: ?Meds ordered this encounter  ?Medications  ? labetalol (NORMODYNE) tablet 100 mg  ?  ?-I have reviewed the patients home medicines and have made adjustments as needed ? ?Consultations Obtained: ?I requested consultation with the MAU APP,  and discussed lab and imaging findings as well as pertinent plan - they recommend: Switching losartan to labetalol ? ? ?Reevaluation: ?After the interventions noted above, I reevaluated the patient and found that they have :improved ? ?Co morbidities that complicate the patient evaluation ? ?Past Medical History:  ?Diagnosis Date  ? Amenorrhea   ? Diabetes mellitus without complication (HCC)   ? Hypertension   ?  ? ? ?Dispostion: ?Patient is appropriate for discharge.  Discharged in stable condition. ? ? ?Final Clinical Impression(s) / ED Diagnoses ?Final diagnoses:  ?Hypertension, unspecified type  ?Nonintractable headache, unspecified chronicity pattern, unspecified headache type  ? ? ?Rx / DC Orders ?ED Discharge  Orders   ? ?      Ordered  ?  labetalol (NORMODYNE) 100 MG tablet  2 times daily       ? 12/12/21 1436  ? ?  ?  ? ?  ? ? ?  ?Marita Kansas, PA-C ?12/12/21 1437 ? ?  ?Jacalyn Lefevre, MD ?12/12/21 1522 ? ?

## 2021-12-14 ENCOUNTER — Encounter (HOSPITAL_COMMUNITY): Payer: Self-pay | Admitting: Obstetrics and Gynecology

## 2021-12-14 ENCOUNTER — Other Ambulatory Visit: Payer: Self-pay

## 2021-12-14 ENCOUNTER — Inpatient Hospital Stay (HOSPITAL_COMMUNITY)
Admission: AD | Admit: 2021-12-14 | Discharge: 2021-12-14 | Disposition: A | Discharge status: Home / Self Care | Payer: 59 | Attending: Obstetrics and Gynecology | Admitting: Obstetrics and Gynecology

## 2021-12-14 ENCOUNTER — Inpatient Hospital Stay (HOSPITAL_COMMUNITY): Admission: RE | Admit: 2021-12-14 | Payer: 59 | Source: Ambulatory Visit

## 2021-12-14 DIAGNOSIS — O039 Complete or unspecified spontaneous abortion without complication: Secondary | ICD-10-CM | POA: Insufficient documentation

## 2021-12-14 DIAGNOSIS — Z6790 Unspecified blood type, Rh positive: Secondary | ICD-10-CM | POA: Insufficient documentation

## 2021-12-14 DIAGNOSIS — Z3A09 9 weeks gestation of pregnancy: Secondary | ICD-10-CM | POA: Insufficient documentation

## 2021-12-14 LAB — ABO/RH: ABO/RH(D): O POS

## 2021-12-14 LAB — HCG, QUANTITATIVE, PREGNANCY: hCG, Beta Chain, Quant, S: 22 m[IU]/mL — ABNORMAL HIGH (ref <5)

## 2021-12-14 NOTE — MAU Note (Addendum)
Melissa Fleming is a 36 y.o. at Unknown here in MAU reporting: here for repeat hormone check.  Feeling fine, has been checking BP at home (recent med change), it is getting better and coming down. Denies pain, is still spotting, pinkish or lt brown.  ?LMP: first wk of Jan only a few days, spotted one day early Feb ? ?Did not take BP med this morning ? ?Vitals:  ? 12/14/21 0839  ?BP: (!) 156/106  ?Pulse: 98  ?Resp: 18  ?Temp: 97.9 ?F (36.6 ?C)  ?SpO2: 100%  ?   ? ?

## 2021-12-14 NOTE — Discharge Instructions (Addendum)
AREA FAMILY PRACTICE PHYSICIANS ? ?[place] [phone]) ?[place] ?[address], [place], [place] [address] ?([phone] ?Mon-Fri 8:30-12:30, 1:30-5:00 ?Accepting Medicaid ?[place] [facility] ?[place], [place], [place] [address] ?([phone] ?Mon-Fri 8:00-5:30 ?[facility] [employer] ?[address], [place], [place] [address] ?([phone] ?Mon, Tue, Thur, Fri 8:30-5:00, Wed 10:00-7:00 (closed 1-2pm) ?Accepting Medicaid ?[facility] ?[place], [place], [place]  [address] ?Phone - [phone]   Fax - [phone] ? ?[place] [phone]) ?[place] ?[address] [place], [place] [address] ?([phone] ?Mon-Fri 8:00-5:00 ?[facility] at [facility])  ?[address] [place], [place] [address] ?([phone] ?Mon-Fri 8:30-5:30, Sat (Oct.-Mar.) 9:00-1:00 ?Accepting Medicaid ? ?[place] [phone]) ?[place] [facility] ?[address], [place], [place] [address] ?([phone] ?Mon-Fri 8:00-5:00 ? ?[facility] [phone]) ?[place] at [facility] ?[address], [place], [place] [address] ?([phone] ?Mon-Fri 8:00-5:00 ?Therapist, music at [place] ?[address] [place], [place] [address] ?[phone] ?Mon-Fri 8:00-5:00 ?Therapist, music at [employer] ?[address] [place], [place] [address] ?([phone] ?Mon-Fri 8:00-5:00 ?[facility] ?[place] [address] ?([phone] ?Mon-Fri 7:30-5:30 ? ?[place] [phone] & [phone]) ?[place] ?[address] [place], [place] [address] ?[phone] ?Mon-Thur 8:00-6:00 ?Accepting Medicaid ?[place] ?[address], [place], [place] [address] ?([phone] ?Mon-Thur 7:30-7:30, Fri 7:30-4:30 ?Accepting  Medicaid ?[place] at [facility] ?[place] [address], [place], [place]  [address] ?[phone]   Fax - [phone] ? ?[place] [phone] & [phone]) ?Therapist, music at [employer] ?[address] [place], [place] [address] ?([phone] ?Mon-Fri 7:00-5:00 ?[place] ?[place], [place], [place] [address] ?([phone] ?Mon-Fri 8:00-5:00 ?Accepting Medicaid ?[place] ?[address], [place], [place] [address] ?([phone] ?Mon-Fri 8:00-5:00 ?Accepting Medicaid ? ?[employer] [place] [phone]) ?[facility] ?[address], [place], [place] [address] ?([phone] ?Mon-Fri 8:00-5:00 ?[place] ([place] at [employer]) ?[address] [place], [place], [place] [address] ?([phone] ?Mon-Fri 8:00-5:00 ?Accepting Medicaid ?[place] ([facility] [employer]) ?[place], [place], [place] [address] ?[phone] ?Mon-Fri 8:00-5:30, Sat&Sun by appointment (phones open at 8:30) ?Accepting Medicaid ? ?[place] [phone] & [phone]) ?[facility] ?[address] [place], [place] [address] ?([phone] ?Mon-Thur 8:00-7:00, Fri 8:00-5:00, Sat 8:00-12:00, Sun 9:00-12:00 ?Accepting Medicaid ?[facility] at [facility] ?[address], [place], [place] [address] ?[phone] ?Mon-Thur 8:00-5:00 ?Accepting Medicaid ?[facility] at [place] ?[address] [place], [place] [address] ?[phone] ?Mon-Fri 8:00-5:30, Sat (Oct.-Mar.) 9:00-1:00 ?Accepting Medicaid ? ?[employer] [phone]) ?[place] ?[address] [place], [place] [address] ?([phone] ?Mon-Fri 8:00-5:00 ?Accepting Medicaid  ? ?[facility] [phone]) ?[place] at [facility] ?[place], [place], [place] [address] ?([phone] ?Mon-Fri 8:00-5:00 ?Therapist, music at [facility] ?[address] [place], [place] [address] ?([phone] ?Mon-Fri 8:00-5:00 ?[place] ?[place] [address], [place],  [address] ?([phone] ?Mon-Fri 8:00-5:00 ?After hours clinic [facility] [place],  [address]) [phone] Mon-Fri 5:00-8:00, Sat 12:00-6:00, Sun 10:00-4:00 ?Accepting Medicaid ?[place] at [facility] [place] ?  [address], [place], [place]  [address] ?[phone]   Fax - [phone] ? ?[place] [phone]) ?Therapist, music at [employer] ?[address], [place], [place] [address] ?([phone] ?Mon-Fri 8:00-5:00 ?[place] ([place] [facility] [place]) ?[address], [place],  [address] ?([phone] ?Mon-Thur 8:00-7:00, Fri 8:00-5:00, Sat 8:00-12:00 ? ? ? ? ? ? ? ? ?Prenatal Care Providers ? ?         ?[facility] for [place] @ [facility] - accepts patients without insurance ? Phone: [phone] ? ?[facility] for [place] @ [place]  ? Phone: [phone] ? ?[place] @[place]      ? Phone: [phone]   ?         ?Center for [place] @ [place]    ? Phone: [phone] ?         ?Center for [place] @ [place]  ? Phone: [phone] ? ?Center for [place] @ [facility] - Dr. Annie Main office ?Phone: [phone] ?    ?[facility] - accepts patients without insurance ?Phone: [phone] ? ?[facility]  ?Phone: [phone] ? ?[facility] ?Phone: [phone] ? ?[facility] ?Phone: [phone] ? ?[facility] ?Phone: [phone] ? ?[place] ?Phone: [phone] ? ?[facility]  ?Phone: [phone] ? ?

## 2021-12-14 NOTE — MAU Provider Note (Signed)
Subjective:  ?Melissa Fleming is a 36 y.o. G1P0 at [redacted]w[redacted]d who presents today for FU BHCG. She was seen on 12/12/2021 at [facility] and was not sent to MAU after for additional evaluation. Results from that day show HCG 64, no [place] was performed. Patient states she originally went to [facility] for blood pressure management, but states she was also having pink spotting at this time. She denies vaginal bleeding. She denies abdominal or pelvic pain. Patient states she has had 2 SABs and a still birth at 5 months. Patient also reports she has not taken her blood pressure medication yet today, but was prescribed medication after her ED visit. Patient was encouraged to take BP medication as soon as she could and to follow-up with PCP in the next 1-2 weeks. ? ?Objective:  ?Physical Exam  ?Nursing note and vitals reviewed. ? ?Patient Vitals for the past 24 hrs: ? BP Temp Temp src Pulse Resp SpO2 Height Weight  ?12/14/21 0839 (!) 156/106 97.9 ?F (36.6 ?C) Oral 98 18 100 % 5' (1.524 m) 95 kg  ? ?Constitutional: She is oriented to person, place, and time. She appears well-developed and well-nourished. No distress.  ?HENT:  ?Head: Normocephalic.  ?Cardiovascular: Normal rate.  ?Respiratory: Effort normal.  ?GI: Soft. There is no tenderness.  ?Neurological: She is alert and oriented to person, place, and time. ?Skin: Skin is warm and dry.  ?Psychiatric: She has a normal mood and affect.  ? ?Results for orders placed or performed during the hospital encounter of 12/14/21 (from the past 24 hour(s))  ?hCG, quantitative, pregnancy     Status: Abnormal  ? Collection Time: 12/14/21  8:40 AM  ?Result Value Ref Range  ? hCG, Beta Chain, Quant, S 22 (H) <5 mIU/mL  ?ABO/Rh     Status: None  ? Collection Time: 12/14/21  9:52 AM  ?Result Value Ref Range  ? ABO/RH(D) O POS   ? No rh immune globuloin    ?  NOT A RH IMMUNE GLOBULIN CANDIDATE, PT RH POSITIVE ?Performed at [facility], [address], [place], [place] [address] ?   ? ? ?Assessment/Plan: ?Miscarriage ?HCG did not rise appropriately, drop by >50% ?FU in 1 weeks for: hCG ?FU in 2 weeks for: hCG + Provider visit ?Patient states her provider was previously Dr. Charlotta Newton at [facility] and would like to see her again ?Patient advised can be seen for SAB FU + discussion on fertility as patient does want to be pregnant ?Patient advised to see PCP for BP management, express desire for pregnancy to appropriate medications are prescribed, and to also have a wellness visit/annual visit to discuss overall health and wellness and identify any health issues that may need correction. PCP list given and patient encouraged to call insurance to discuss who is in-network ?Message sent to [facility] to schedule with Dr. Charlotta Newton in 2 weeks for SAB FU and recurrent pregnancy loss discussion ?Pt discharged to home in stable condition ? ?NugentOdie Sera, NP  ?10:28 AM ?12/14/2021 ?

## 2021-12-23 ENCOUNTER — Other Ambulatory Visit: Payer: 59

## 2021-12-23 DIAGNOSIS — O039 Complete or unspecified spontaneous abortion without complication: Secondary | ICD-10-CM

## 2021-12-24 LAB — BETA HCG QUANT (REF LAB): hCG Quant: 5 m[IU]/mL

## 2021-12-26 ENCOUNTER — Ambulatory Visit (INDEPENDENT_AMBULATORY_CARE_PROVIDER_SITE_OTHER): Payer: 59 | Admitting: Obstetrics & Gynecology

## 2021-12-26 ENCOUNTER — Encounter: Payer: Self-pay | Admitting: Obstetrics & Gynecology

## 2021-12-26 ENCOUNTER — Other Ambulatory Visit: Payer: Self-pay

## 2021-12-26 ENCOUNTER — Other Ambulatory Visit (HOSPITAL_COMMUNITY)
Admission: RE | Admit: 2021-12-26 | Discharge: 2021-12-26 | Disposition: A | Discharge status: Home / Self Care | Payer: 59 | Source: Ambulatory Visit | Attending: Obstetrics & Gynecology | Admitting: Obstetrics & Gynecology

## 2021-12-26 VITALS — BP 167/114 | HR 81 | Ht 60.0 in | Wt 206.6 lb

## 2021-12-26 DIAGNOSIS — O039 Complete or unspecified spontaneous abortion without complication: Secondary | ICD-10-CM

## 2021-12-26 DIAGNOSIS — N96 Recurrent pregnancy loss: Secondary | ICD-10-CM

## 2021-12-26 DIAGNOSIS — N912 Amenorrhea, unspecified: Secondary | ICD-10-CM | POA: Insufficient documentation

## 2021-12-26 DIAGNOSIS — Z01419 Encounter for gynecological examination (general) (routine) without abnormal findings: Secondary | ICD-10-CM | POA: Insufficient documentation

## 2021-12-26 NOTE — Progress Notes (Signed)
? ?WELL-WOMAN EXAMINATION ?Patient name: Melissa Fleming MRN [id number]  Date of birth: [birth year] ?Chief Complaint:   ?Follow-up, Recurrent Miscarriage, and Miscarriage ? ?History of Present Illness:   ?Melissa Fleming is a 36 y.o. G3P0030 s/p recent miscarriage female being seen today for a routine well-woman exam and the following concerns: ? ?-Recent miscarriage- seen on 3/11 in MAU.  Pt thought she was ~ 9wks, hCG 5.  She did not have an [place] at anytime that confirmed IUP.  Heavy BRB had stopped, only notes light brown discharge.  Denies pelvic or abdominal pain. ? ?Of note, this is now her 3rd pregnancy loss.  2 SAB in first trimester.  1 miscarriage in 2nd trimester- around 54mos. ? ?Menses regular each month- denies HMB or dysmenorrhea.   ?Same partner as previously, 36yo female- prior children ? ?Records reviewed- pt notes h/o HMB resolved with removal of Liletta.  Day 3 lab work completed, 12/2019- normal labs.  Prior records also note h/o PCOS ? ?Patient's last menstrual period was 12/14/2021 (exact date). ?Denies issues with her menses ? ?The current method of family planning- desires future pregnancy.  ? ? ?Last pap not sure- not previously done at [facility] (records reviewed- accident disposal of Pap).  ?Last mammogram: n/a. ?Last colonoscopy: n/a ? ? ?  12/26/2021  ? 10:52 AM  ?Depression screen PHQ 2/9  ?Decreased Interest 0  ?Down, Depressed, Hopeless 0  ?PHQ - 2 Score 0  ?Altered sleeping 0  ?Tired, decreased energy 1  ?Change in appetite 0  ?Feeling bad or failure about yourself  0  ?Trouble concentrating 0  ?Moving slowly or fidgety/restless 0  ?Suicidal thoughts 0  ?PHQ-9 Score 1  ? ? ? ? ?Review of Systems:   ?Pertinent items are noted in HPI ?Denies any headaches, blurred vision, fatigue, shortness of breath, chest pain, abdominal pain, bowel movements, urination, or intercourse unless otherwise stated above. ? ?Pertinent History Reviewed:  ?Reviewed past medical,surgical, social and family history.   ?Reviewed problem list, medications and allergies. ?Physical Assessment:  ? ?Vitals:  ? 12/26/21 1046 12/26/21 1047  ?BP: (!) 178/115 (!) 167/114  ?Pulse: 81 81  ?Weight: 206 lb 9.6 oz (93.7 kg)   ?Height: 5' (1.524 m)   ?Body mass index is 40.35 kg/m?. ?  ?     Physical Examination:  ? General appearance - well appearing, and in no distress ? Mental status - alert, oriented to person, place, and time ? Psych:  She has a normal mood and affect ? Skin - warm and dry, normal color, no suspicious lesions noted ? Chest - effort normal, all lung fields clear to auscultation bilaterally ? Heart - normal rate and regular rhythm ? Neck:  midline trachea, no thyromegaly or nodules ? Breasts - breasts appear normal, no suspicious masses, no skin or nipple changes or  axillary nodes ? Abdomen - soft, nontender, nondistended, no masses or organomegaly ? Pelvic - VULVA: normal appearing vulva with no masses, tenderness or lesions  VAGINA: normal appearing vagina with normal color and discharge, no lesions  CERVIX: normal appearing cervix without discharge or lesions, no CMT ? Thin prep pap is done with HR HPV cotesting ? UTERUS: uterus is felt to be normal size, shape, consistency and nontender  ? ADNEXA: No adnexal masses or tenderness noted. ? Extremities:  No swelling or varicosities noted ? ?Chaperone:  pt declined    ? ? ?Assessment & Plan:  ?1) Well-Woman Exam ?-pap collected, reviewed screening guidelines ? ?2) Miscarraige ?  Plan for hCG today to confirm non-pregnancy state ?Reassured pt that some irregular bleeding may be normal ?-does desire future pregnancy ? ?3) Recurrent pregnancy loss ?- discussed that unfortunately often time we do not have a reason/work up may not reveal an underlying etiology ?-discussed work up including lab work- to be done at [facility] in [place] ?-plan for pelvic US/SHG to evaluate uterine cavity ?-Also discussed that a future pregnancy is feasible, reviewed study that looked at prior SAB and next  pregnancy- based on age and prior loss- ~73% chance of success ? ?Orders Placed This Encounter  ?Procedures  ? Beta hCG quant (ref lab)  ? TSH  ? Chromosome, Blood, Routine  ? Lupus Anticoag/Cardiolipin Ab  ? ? ?Meds: No orders of the defined types were placed in this encounter. ? ? ?Follow-up: Return for pelvic US-our office please. ? ? ?Janyth Pupa, DO ?Attending [place], Faculty Practice ?Center for [place] ? ? ?

## 2021-12-31 LAB — CYTOLOGY - PAP
Adequacy: ABSENT
Chlamydia: NEGATIVE
Comment: NEGATIVE
Comment: NEGATIVE
Comment: NEGATIVE
Comment: NEGATIVE
Comment: NORMAL
Diagnosis: NEGATIVE
HPV 16: NEGATIVE
HPV 18 / 45: NEGATIVE
High risk HPV: POSITIVE — AB
Neisseria Gonorrhea: NEGATIVE

## 2022-01-01 ENCOUNTER — Encounter: Payer: Self-pay | Admitting: Obstetrics & Gynecology

## 2022-01-17 ENCOUNTER — Other Ambulatory Visit: Payer: Self-pay | Admitting: Obstetrics & Gynecology

## 2022-01-17 DIAGNOSIS — O09299 Supervision of pregnancy with other poor reproductive or obstetric history, unspecified trimester: Secondary | ICD-10-CM

## 2022-01-20 ENCOUNTER — Ambulatory Visit (INDEPENDENT_AMBULATORY_CARE_PROVIDER_SITE_OTHER): Payer: 59 | Admitting: Obstetrics & Gynecology

## 2022-01-20 ENCOUNTER — Ambulatory Visit (INDEPENDENT_AMBULATORY_CARE_PROVIDER_SITE_OTHER): Payer: 59

## 2022-01-20 ENCOUNTER — Encounter: Payer: Self-pay | Admitting: Obstetrics & Gynecology

## 2022-01-20 VITALS — BP 178/122 | HR 88 | Ht 60.0 in | Wt 209.6 lb

## 2022-01-20 DIAGNOSIS — O09299 Supervision of pregnancy with other poor reproductive or obstetric history, unspecified trimester: Secondary | ICD-10-CM | POA: Diagnosis not present

## 2022-01-20 DIAGNOSIS — N96 Recurrent pregnancy loss: Secondary | ICD-10-CM

## 2022-01-20 DIAGNOSIS — I10 Essential (primary) hypertension: Secondary | ICD-10-CM

## 2022-01-20 MED ORDER — LABETALOL HCL 200 MG PO TABS: 200.0000 mg | ORAL_TABLET | Freq: Two times a day (BID) | ORAL | 3 refills | Status: DC

## 2022-01-20 NOTE — Progress Notes (Signed)
PELVIC US TA/TV: homogeneous anteverted uterus,wnl,EEC 8.8 mm,normal ovaries,ovaries appear mobile,no free fluid,no pain during ultrasound  ? ?Chaperone Tammy ?

## 2022-01-20 NOTE — Progress Notes (Signed)
? ?  GYN VISIT ?Patient name: Melissa Fleming MRN [id number]  Date of birth: [birth year] ?Chief Complaint:   ?discuss [place] results ? ?History of Present Illness:   ?[name] is a 36 y.o. G27P0030 female being seen today for follow up regarding recurrent pregnancy loss. ? ?Pt just started her menses- no acute concerns.  Denies irregular discharge, irritation or pelvic pain.  No acute complaints ? ?Ultrasound completed today - homogeneous anteverted uterus,wnl,EEC 8.8 mm,normal ovaries,ovaries appear mobile,no free fluid,no pain during ultrasound  ? ?Of note h/o HTN- ran out of her BP medication x 1wk ? ? ? ?  12/26/2021  ? 10:52 AM  ?Depression screen PHQ 2/9  ?Decreased Interest 0  ?Down, Depressed, Hopeless 0  ?PHQ - 2 Score 0  ?Altered sleeping 0  ?Tired, decreased energy 1  ?Change in appetite 0  ?Feeling bad or failure about yourself  0  ?Trouble concentrating 0  ?Moving slowly or fidgety/restless 0  ?Suicidal thoughts 0  ?PHQ-9 Score 1  ? ? ? ?Review of Systems:   ?Pertinent items are noted in HPI ?Denies fever/chills, dizziness, headaches, visual disturbances, fatigue, shortness of breath, chest pain, abdominal pain, vomiting, bowel movements, urination, or intercourse unless otherwise stated above.  ?Pertinent History Reviewed:  ?Reviewed past medical,surgical, social, obstetrical and family history.  ?Reviewed problem list, medications and allergies. ?Physical Assessment:  ? ?Vitals:  ? 01/20/22 1044  ?BP: (!) 178/122  ?Pulse: 88  ?Weight: 209 lb 9.6 oz (95.1 kg)  ?Height: 5' (1.524 m)  ?Body mass index is 40.93 kg/m?. ? ?     Physical Examination:  ? General appearance: alert, well appearing, and in no distress ? Psych: mood appropriate, normal affect ? Skin: warm & dry  ? Cardiovascular: normal heart rate noted ? Respiratory: normal respiratory effort, no distress ? Extremities: no edema  ? ?Chaperone: N/A   ? ?Assessment & Plan:  ?1) Recurrent pregnancy loss ?-Reviewed [place]- no abnormalities noted ?-pt to  completed lab work, if negative plan to repeat in 6-8wks ?-unfortunately sometimes we can't always find a cause ?-ok to start actively trying with PNV daily and if positive pregnancy test- RTC for early confirmation ? ?2) cHTN ?-pt out of her current medication ?-plan to refill, continue with Labetalol ?-advise against losartan while trying to conceive ?-plan to f/u with PCP ? ?Meds ordered this encounter  ?Medications  ? labetalol (NORMODYNE) 200 MG tablet  ?  Sig: Take 1 tablet (200 mg total) by mouth 2 (two) times daily.  ?  Dispense:  180 tablet  ?  Refill:  3  ? ? ? ?Myna Hidalgo, DO ?Attending Obstetrician & Gynecologist, Faculty Practice ?Center for [employer], [facility] ? ? ? ?

## 2022-02-12 LAB — CHROMOSOME, BLOOD, ROUTINE
Cells Analyzed: 20
Cells Counted: 20
Cells Karyotyped: 2
GTG Band Resolution Achieved: 500

## 2022-02-12 LAB — LUPUS ANTICOAG/CARDIOLIPIN AB
APTT: 28.8 s
Anticardiolipin Ab, IgG: <10 [GPL'U]
Anticardiolipin Ab, IgM: 11 [MPL'U]
Beta-2 Glycoprotein I, IgA: 12 SAU
Beta-2 Glycoprotein I, IgG: <10 SGU
Beta-2 Glycoprotein I, IgM: <10 SMU
DRVVT Screen Seconds: 43.5 s
Hexagonal Phospholipid Neutral: 4 s
INR: 1 ratio
Prothrombin Time: 10.2 s
Thrombin Time: 16.6 s

## 2022-02-12 LAB — TSH: TSH: 0.687 u[IU]/mL (ref 0.450–4.500)

## 2022-06-26 IMAGING — CR DG ANKLE COMPLETE 3+V*R*
3 series · 3 of 3 positions shown · non-contrast
Comparison: None.

CLINICAL DATA: Fall from step ladder, ankle pain

EXAM:
RIGHT ANKLE - COMPLETE 3+ VIEW

[ankle ap]
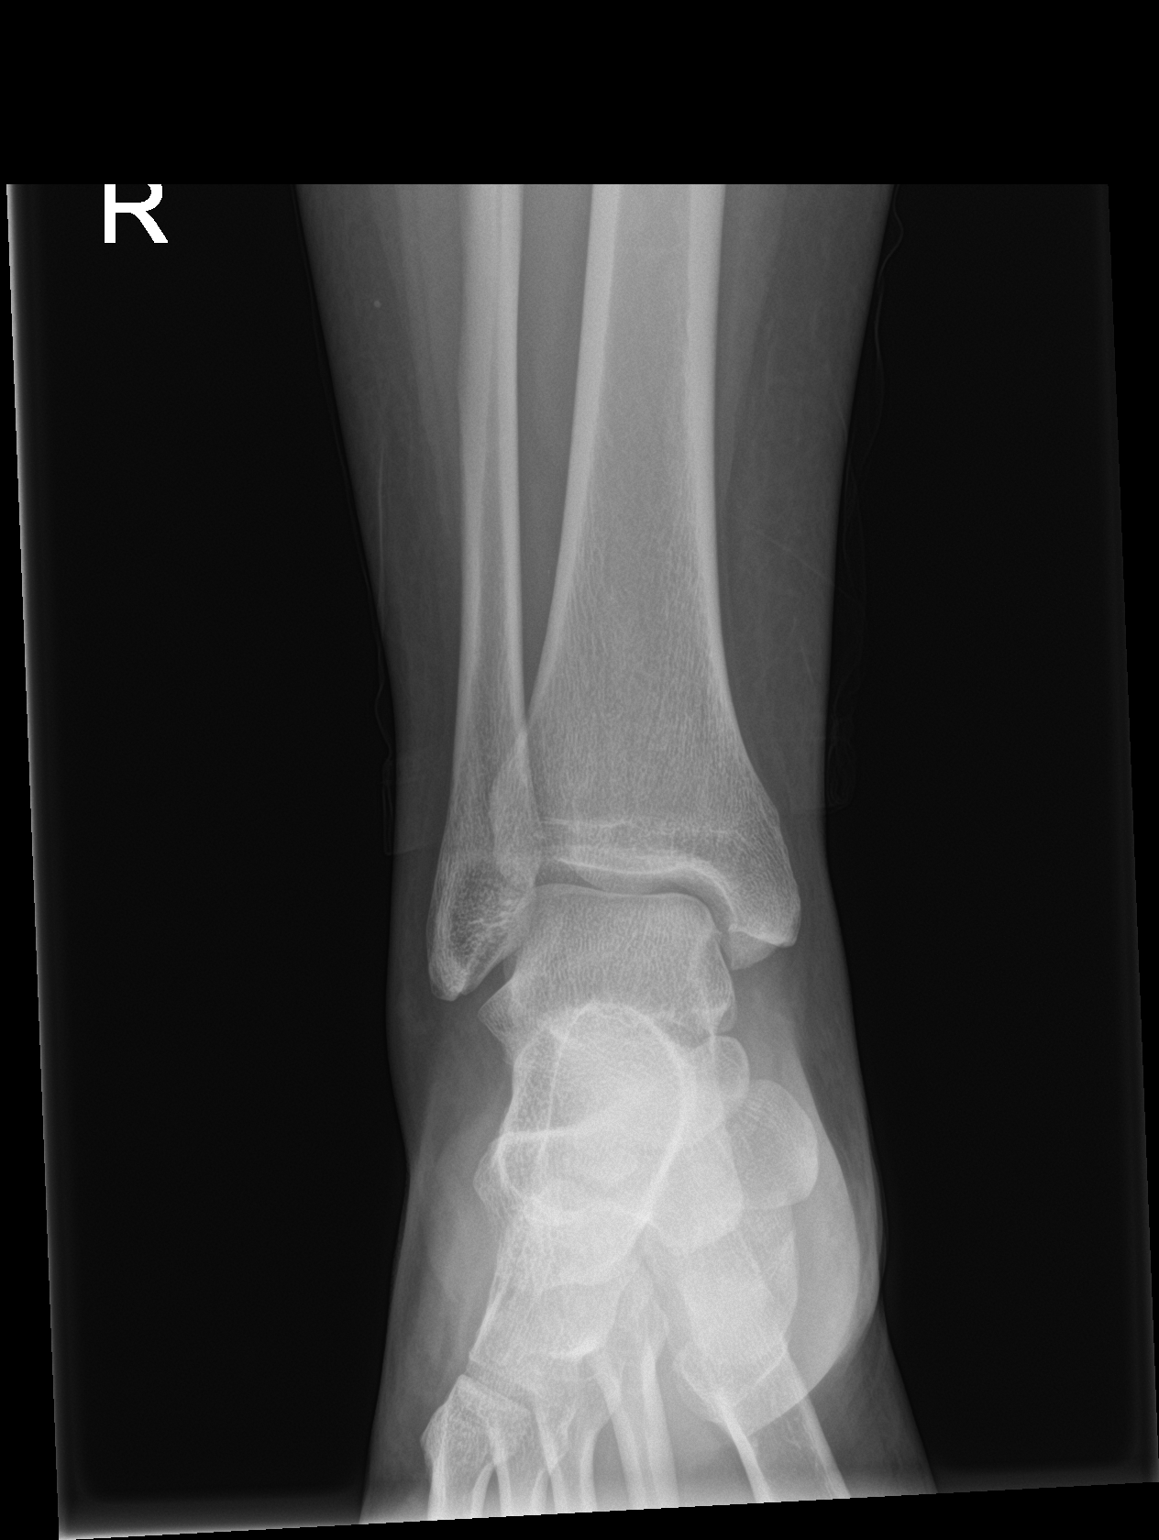

[ankle obl]
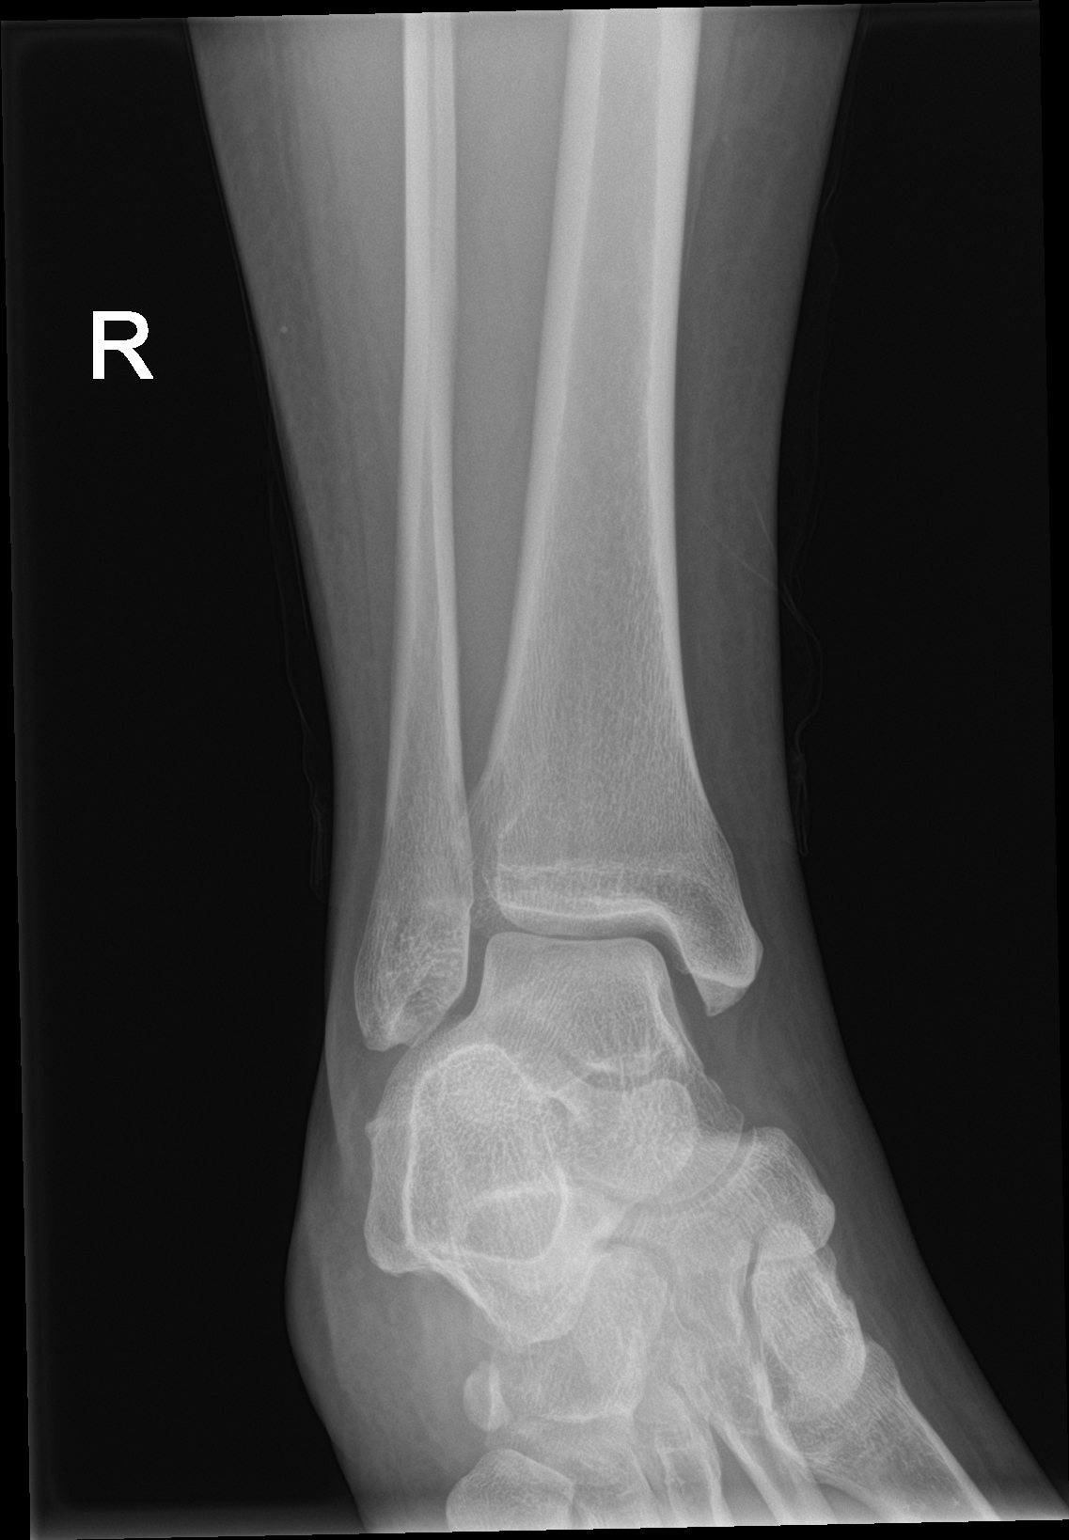

[ankle lat]
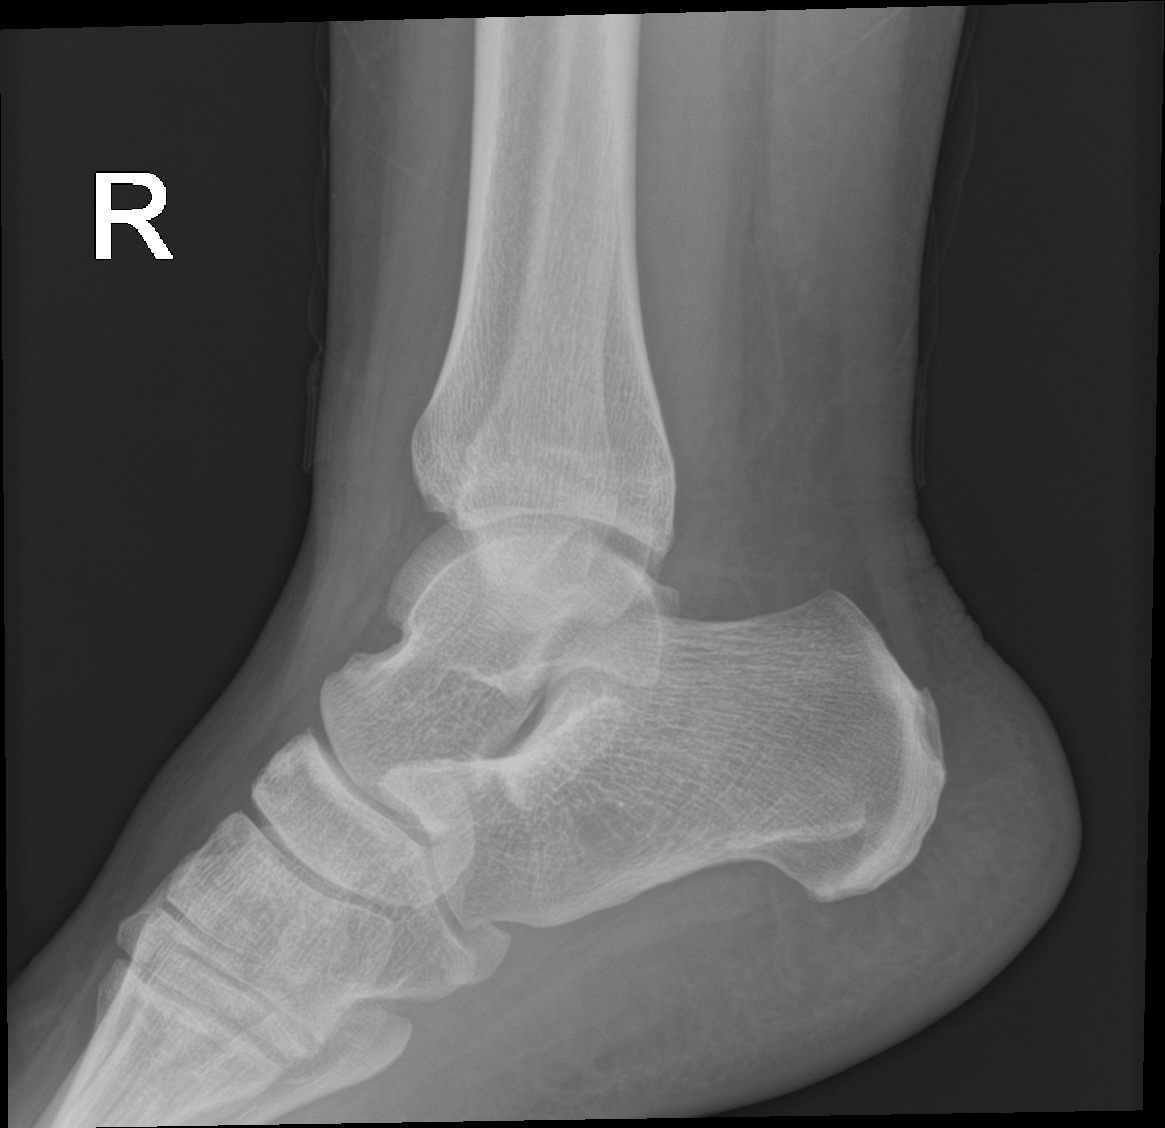

[3 of 3 positions shown; findings below may reference images not displayed]

FINDINGS: There is no evidence of fracture, dislocation, or joint effusion.
There is no evidence of arthropathy or other focal bone abnormality.
Soft tissue edema about the anterior ankle.
IMPRESSION: No fracture or dislocation of the right ankle. Joint spaces are
preserved. Soft tissue edema about the anterior ankle.

## 2022-12-17 DIAGNOSIS — Z136 Encounter for screening for cardiovascular disorders: Secondary | ICD-10-CM | POA: Diagnosis not present

## 2022-12-17 DIAGNOSIS — Z1329 Encounter for screening for other suspected endocrine disorder: Secondary | ICD-10-CM | POA: Diagnosis not present

## 2022-12-17 DIAGNOSIS — E119 Type 2 diabetes mellitus without complications: Secondary | ICD-10-CM | POA: Diagnosis not present

## 2022-12-17 DIAGNOSIS — Z0001 Encounter for general adult medical examination with abnormal findings: Secondary | ICD-10-CM | POA: Diagnosis not present

## 2022-12-17 DIAGNOSIS — I10 Essential (primary) hypertension: Secondary | ICD-10-CM | POA: Diagnosis not present

## 2022-12-17 DIAGNOSIS — Z6841 Body Mass Index (BMI) 40.0 and over, adult: Secondary | ICD-10-CM | POA: Diagnosis not present

## 2022-12-17 DIAGNOSIS — R635 Abnormal weight gain: Secondary | ICD-10-CM | POA: Diagnosis not present

## 2023-03-21 DIAGNOSIS — O200 Threatened abortion: Secondary | ICD-10-CM | POA: Diagnosis not present

## 2023-03-21 DIAGNOSIS — O209 Hemorrhage in early pregnancy, unspecified: Secondary | ICD-10-CM | POA: Diagnosis not present

## 2023-03-21 DIAGNOSIS — Z3A01 Less than 8 weeks gestation of pregnancy: Secondary | ICD-10-CM | POA: Diagnosis not present

## 2023-03-21 DIAGNOSIS — O99281 Endocrine, nutritional and metabolic diseases complicating pregnancy, first trimester: Secondary | ICD-10-CM | POA: Diagnosis not present

## 2023-03-21 DIAGNOSIS — O26891 Other specified pregnancy related conditions, first trimester: Secondary | ICD-10-CM | POA: Diagnosis not present

## 2023-03-21 DIAGNOSIS — E871 Hypo-osmolality and hyponatremia: Secondary | ICD-10-CM | POA: Diagnosis not present

## 2023-03-21 DIAGNOSIS — E876 Hypokalemia: Secondary | ICD-10-CM | POA: Diagnosis not present

## 2023-04-06 DIAGNOSIS — O161 Unspecified maternal hypertension, first trimester: Secondary | ICD-10-CM | POA: Diagnosis not present

## 2023-04-06 DIAGNOSIS — O9A211 Injury, poisoning and certain other consequences of external causes complicating pregnancy, first trimester: Secondary | ICD-10-CM | POA: Diagnosis not present

## 2023-04-06 DIAGNOSIS — S301XXA Contusion of abdominal wall, initial encounter: Secondary | ICD-10-CM | POA: Diagnosis not present

## 2023-04-06 DIAGNOSIS — F17290 Nicotine dependence, other tobacco product, uncomplicated: Secondary | ICD-10-CM | POA: Diagnosis not present

## 2023-04-06 DIAGNOSIS — O26891 Other specified pregnancy related conditions, first trimester: Secondary | ICD-10-CM | POA: Diagnosis not present

## 2023-04-06 DIAGNOSIS — Z3A10 10 weeks gestation of pregnancy: Secondary | ICD-10-CM | POA: Diagnosis not present

## 2023-04-06 DIAGNOSIS — O99331 Smoking (tobacco) complicating pregnancy, first trimester: Secondary | ICD-10-CM | POA: Diagnosis not present

## 2023-04-06 DIAGNOSIS — Z79899 Other long term (current) drug therapy: Secondary | ICD-10-CM | POA: Diagnosis not present

## 2023-04-06 DIAGNOSIS — Z885 Allergy status to narcotic agent status: Secondary | ICD-10-CM | POA: Diagnosis not present

## 2023-04-06 DIAGNOSIS — O09521 Supervision of elderly multigravida, first trimester: Secondary | ICD-10-CM | POA: Diagnosis not present

## 2023-04-29 ENCOUNTER — Other Ambulatory Visit: Payer: Self-pay | Admitting: Obstetrics & Gynecology

## 2023-04-29 ENCOUNTER — Encounter: Payer: Self-pay | Admitting: Advanced Practice Midwife

## 2023-04-29 DIAGNOSIS — Z3682 Encounter for antenatal screening for nuchal translucency: Secondary | ICD-10-CM

## 2023-04-29 DIAGNOSIS — O0990 Supervision of high risk pregnancy, unspecified, unspecified trimester: Secondary | ICD-10-CM | POA: Insufficient documentation

## 2023-04-30 ENCOUNTER — Encounter: Payer: Medicaid Other | Admitting: *Deleted

## 2023-04-30 ENCOUNTER — Encounter: Payer: Self-pay | Admitting: Advanced Practice Midwife

## 2023-04-30 ENCOUNTER — Ambulatory Visit (INDEPENDENT_AMBULATORY_CARE_PROVIDER_SITE_OTHER): Payer: Medicaid Other

## 2023-04-30 ENCOUNTER — Ambulatory Visit (INDEPENDENT_AMBULATORY_CARE_PROVIDER_SITE_OTHER): Payer: Medicaid Other | Admitting: Advanced Practice Midwife

## 2023-04-30 ENCOUNTER — Other Ambulatory Visit (HOSPITAL_COMMUNITY)
Admission: RE | Admit: 2023-04-30 | Discharge: 2023-04-30 | Disposition: A | Discharge status: Home / Self Care | Payer: Medicaid Other | Source: Ambulatory Visit | Attending: Advanced Practice Midwife | Admitting: Advanced Practice Midwife

## 2023-04-30 VITALS — BP 142/98 | HR 82 | Wt 196.0 lb

## 2023-04-30 DIAGNOSIS — Z124 Encounter for screening for malignant neoplasm of cervix: Secondary | ICD-10-CM | POA: Insufficient documentation

## 2023-04-30 DIAGNOSIS — Z113 Encounter for screening for infections with a predominantly sexual mode of transmission: Secondary | ICD-10-CM | POA: Insufficient documentation

## 2023-04-30 DIAGNOSIS — Z31430 Encounter of female for testing for genetic disease carrier status for procreative management: Secondary | ICD-10-CM | POA: Diagnosis not present

## 2023-04-30 DIAGNOSIS — I10 Essential (primary) hypertension: Secondary | ICD-10-CM

## 2023-04-30 DIAGNOSIS — O0992 Supervision of high risk pregnancy, unspecified, second trimester: Secondary | ICD-10-CM

## 2023-04-30 DIAGNOSIS — Z6836 Body mass index (BMI) 36.0-36.9, adult: Secondary | ICD-10-CM | POA: Diagnosis not present

## 2023-04-30 DIAGNOSIS — E282 Polycystic ovarian syndrome: Secondary | ICD-10-CM

## 2023-04-30 DIAGNOSIS — Z363 Encounter for antenatal screening for malformations: Secondary | ICD-10-CM

## 2023-04-30 DIAGNOSIS — Z833 Family history of diabetes mellitus: Secondary | ICD-10-CM

## 2023-04-30 DIAGNOSIS — Z3A12 12 weeks gestation of pregnancy: Secondary | ICD-10-CM | POA: Diagnosis not present

## 2023-04-30 DIAGNOSIS — O0991 Supervision of high risk pregnancy, unspecified, first trimester: Secondary | ICD-10-CM | POA: Diagnosis not present

## 2023-04-30 DIAGNOSIS — Z3682 Encounter for antenatal screening for nuchal translucency: Secondary | ICD-10-CM

## 2023-04-30 MED ORDER — LABETALOL HCL 200 MG PO TABS: 200.0000 mg | ORAL_TABLET | Freq: Two times a day (BID) | ORAL | 3 refills | Status: DC

## 2023-04-30 MED ORDER — ASPIRIN 81 MG PO TBEC: 162.0000 mg | DELAYED_RELEASE_TABLET | Freq: Every day | ORAL | 6 refills | Status: DC

## 2023-04-30 MED ORDER — LABETALOL HCL 300 MG PO TABS
300.0000 mg | ORAL_TABLET | Freq: Two times a day (BID) | ORAL | 3 refills | Status: DC
Start: 2023-04-30 — End: 2023-06-15

## 2023-04-30 NOTE — Patient Instructions (Signed)
ZOXWRUE Heims, I greatly value your feedback.  If you receive a survey following your visit with [place] today, we appreciate you taking the time to fill it out.  Thanks, Cathie Beams, DNP, CNM  [facility] HAS MOVED!!! It is now [facility] at [facility] ([address] [place], [place] [address]) Entrance located off of [address] Free 24/7 valet parking   Nausea & Vomiting Have saltine crackers or pretzels by your bed and eat a few bites before you raise your head out of bed in the morning Eat small frequent meals throughout the day instead of large meals Drink plenty of fluids throughout the day to stay hydrated, just don't drink a lot of fluids with your meals.  This can make your stomach fill up faster making you feel sick Do not brush your teeth right after you eat Products with real ginger are good for nausea, like ginger ale and ginger hard candy Make sure it says made with real ginger! Sucking on sour candy like lemon heads is also good for nausea If your prenatal vitamins make you nauseated, take them at night so you will sleep through the nausea Sea Bands If you feel like you need medicine for the nausea & vomiting please let us know If you are unable to keep any fluids or food down please let us know   Constipation Drink plenty of fluid, preferably water, throughout the day Eat foods high in fiber such as fruits, vegetables, and grains Exercise, such as walking, is a good way to keep your bowels regular Drink warm fluids, especially warm prune juice, or decaf coffee Eat a 1/2 cup of real oatmeal (not instant), 1/2 cup applesauce, and 1/2-1 cup warm prune juice every day If needed, you may take Colace (docusate sodium) stool softener once or twice a day to help keep the stool soft.  If you still are having problems with constipation, you may take Miralax once daily as needed to help keep your bowels regular.   Home Blood Pressure Monitoring for  Patients   Your provider has recommended that you check your blood pressure (BP) at least once a week at home. If you do not have a blood pressure cuff at home, one will be provided for you. Contact your provider if you have not received your monitor within 1 week.   Helpful Tips for Accurate Home Blood Pressure Checks  Don't smoke, exercise, or drink caffeine 30 minutes before checking your BP Use the restroom before checking your BP (a full bladder can raise your pressure) Relax in a comfortable upright chair Feet on the ground Left arm resting comfortably on a flat surface at the level of your heart Legs uncrossed Back supported Sit quietly and don't talk Place the cuff on your bare arm Adjust snuggly, so that only two fingertips can fit between your skin and the top of the cuff Check 2 readings separated by at least one minute Keep a log of your BP readings For a visual, please reference this diagram: http://ccnc.care/bpdiagram  Provider Name: [facility]     Phone: [phone]  Zone 1: ALL CLEAR  Continue to monitor your symptoms:  BP reading is less than 140 (top number) or less than 90 (bottom number)  No right upper stomach pain No headaches or seeing spots No feeling nauseated or throwing up No swelling in face and hands  Zone 2: CAUTION Call your doctor's office for any of the following:  BP  reading is greater than 140 (top number) or greater than 90 (bottom number)  Stomach pain under your ribs in the middle or right side Headaches or seeing spots Feeling nauseated or throwing up Swelling in face and hands  Zone 3: EMERGENCY  Seek immediate medical care if you have any of the following:  BP reading is greater than160 (top number) or greater than 110 (bottom number) Severe headaches not improving with Tylenol Serious difficulty catching your breath Any worsening symptoms from Zone 2    First Trimester of Pregnancy The first trimester of pregnancy is from  week 1 until the end of week 12 (months 1 through 3). A week after a sperm fertilizes an egg, the egg will implant on the wall of the uterus. This embryo will begin to develop into a baby. Genes from you and your partner are forming the baby. The female genes determine whether the baby is a boy or a girl. At 6-8 weeks, the eyes and face are formed, and the heartbeat can be seen on ultrasound. At the end of 12 weeks, all the baby's organs are formed.  Now that you are pregnant, you will want to do everything you can to have a healthy baby. Two of the most important things are to get good prenatal care and to follow your health care provider's instructions. Prenatal care is all the medical care you receive before the baby's birth. This care will help prevent, find, and treat any problems during the pregnancy and childbirth. BODY CHANGES Your body goes through many changes during pregnancy. The changes vary from woman to woman.  You may gain or lose a couple of pounds at first. You may feel sick to your stomach (nauseous) and throw up (vomit). If the vomiting is uncontrollable, call your health care provider. You may tire easily. You may develop headaches that can be relieved by medicines approved by your health care provider. You may urinate more often. Painful urination may mean you have a bladder infection. You may develop heartburn as a result of your pregnancy. You may develop constipation because certain hormones are causing the muscles that push waste through your intestines to slow down. You may develop hemorrhoids or swollen, bulging veins (varicose veins). Your breasts may begin to grow larger and become tender. Your nipples may stick out more, and the tissue that surrounds them (areola) may become darker. Your gums may bleed and may be sensitive to brushing and flossing. Dark spots or blotches (chloasma, mask of pregnancy) may develop on your face. This will likely fade after the baby is  born. Your menstrual periods will stop. You may have a loss of appetite. You may develop cravings for certain kinds of food. You may have changes in your emotions from day to day, such as being excited to be pregnant or being concerned that something may go wrong with the pregnancy and baby. You may have more vivid and strange dreams. You may have changes in your hair. These can include thickening of your hair, rapid growth, and changes in texture. Some women also have hair loss during or after pregnancy, or hair that feels dry or thin. Your hair will most likely return to normal after your baby is born. WHAT TO EXPECT AT YOUR PRENATAL VISITS During a routine prenatal visit: You will be weighed to make sure you and the baby are growing normally. Your blood pressure will be taken. Your abdomen will be measured to track your baby's growth. The fetal heartbeat  will be listened to starting around week 10 or 12 of your pregnancy. Test results from any previous visits will be discussed. Your health care provider may ask you: How you are feeling. If you are feeling the baby move. If you have had any abnormal symptoms, such as leaking fluid, bleeding, severe headaches, or abdominal cramping. If you have any questions. Other tests that may be performed during your first trimester include: Blood tests to find your blood type and to check for the presence of any previous infections. They will also be used to check for low iron levels (anemia) and Rh antibodies. Later in the pregnancy, blood tests for diabetes will be done along with other tests if problems develop. Urine tests to check for infections, diabetes, or protein in the urine. An ultrasound to confirm the proper growth and development of the baby. An amniocentesis to check for possible genetic problems. Fetal screens for spina bifida and Down syndrome. You may need other tests to make sure you and the baby are doing well. HOME CARE  INSTRUCTIONS  Medicines Follow your health care provider's instructions regarding medicine use. Specific medicines may be either safe or unsafe to take during pregnancy. Take your prenatal vitamins as directed. If you develop constipation, try taking a stool softener if your health care provider approves. Diet Eat regular, well-balanced meals. Choose a variety of foods, such as meat or vegetable-based protein, fish, milk and low-fat dairy products, vegetables, fruits, and whole grain breads and cereals. Your health care provider will help you determine the amount of weight gain that is right for you. Avoid raw meat and uncooked cheese. These carry germs that can cause birth defects in the baby. Eating four or five small meals rather than three large meals a day may help relieve nausea and vomiting. If you start to feel nauseous, eating a few soda crackers can be helpful. Drinking liquids between meals instead of during meals also seems to help nausea and vomiting. If you develop constipation, eat more high-fiber foods, such as fresh vegetables or fruit and whole grains. Drink enough fluids to keep your urine clear or pale yellow. Activity and Exercise Exercise only as directed by your health care provider. Exercising will help you: Control your weight. Stay in shape. Be prepared for labor and delivery. Experiencing pain or cramping in the lower abdomen or low back is a good sign that you should stop exercising. Check with your health care provider before continuing normal exercises. Try to avoid standing for long periods of time. Move your legs often if you must stand in one place for a long time. Avoid heavy lifting. Wear low-heeled shoes, and practice good posture. You may continue to have sex unless your health care provider directs you otherwise. Relief of Pain or Discomfort Wear a good support bra for breast tenderness.   Take warm sitz baths to soothe any pain or discomfort caused by  hemorrhoids. Use hemorrhoid cream if your health care provider approves.   Rest with your legs elevated if you have leg cramps or low back pain. If you develop varicose veins in your legs, wear support hose. Elevate your feet for 15 minutes, 3-4 times a day. Limit salt in your diet. Prenatal Care Schedule your prenatal visits by the twelfth week of pregnancy. They are usually scheduled monthly at first, then more often in the last 2 months before delivery. Write down your questions. Take them to your prenatal visits. Keep all your prenatal visits as directed by  your health care provider. Safety Wear your seat belt at all times when driving. Make a list of emergency phone numbers, including numbers for family, friends, the hospital, and police and fire departments. General Tips Ask your health care provider for a referral to a local prenatal education class. Begin classes no later than at the beginning of month 6 of your pregnancy. Ask for help if you have counseling or nutritional needs during pregnancy. Your health care provider can offer advice or refer you to specialists for help with various needs. Do not use hot tubs, steam rooms, or saunas. Do not douche or use tampons or scented sanitary pads. Do not cross your legs for long periods of time. Avoid cat litter boxes and soil used by cats. These carry germs that can cause birth defects in the baby and possibly loss of the fetus by miscarriage or stillbirth. Avoid all smoking, herbs, alcohol, and medicines not prescribed by your health care provider. Chemicals in these affect the formation and growth of the baby. Schedule a dentist appointment. At home, brush your teeth with a soft toothbrush and be gentle when you floss. SEEK MEDICAL CARE IF:  You have dizziness. You have mild pelvic cramps, pelvic pressure, or nagging pain in the abdominal area. You have persistent nausea, vomiting, or diarrhea. You have a bad smelling vaginal  discharge. You have pain with urination. You notice increased swelling in your face, hands, legs, or ankles. SEEK IMMEDIATE MEDICAL CARE IF:  You have a fever. You are leaking fluid from your vagina. You have spotting or bleeding from your vagina. You have severe abdominal cramping or pain. You have rapid weight gain or loss. You vomit blood or material that looks like coffee grounds. You are exposed to [place] measles and have never had them. You are exposed to fifth disease or chickenpox. You develop a severe headache. You have shortness of breath. You have any kind of trauma, such as from a fall or a car accident. Document Released: 09/16/2001 Document Revised: 02/06/2014 Document Reviewed: 08/02/2013 [facility] Patient Information 2015 [place], [place]. This information is not intended to replace advice given to you by your health care provider. Make sure you discuss any questions you have with your health care provider.  ADDITIONAL HEALTHCARE OPTIONS FOR PATIENTS  [place] [facility] / e-Visit: [email or web address]         [facility]: [phone]  [facility]: [phone]                   [facility]: [phone]     Safe Medications in Pregnancy   Acne: Benzoyl Peroxide Salicylic Acid  Backache/Headache: Tylenol: 2 regular strength every 4 hours OR              2 Extra strength every 6 hours  Colds/Coughs/Allergies: Benadryl (alcohol free) 25 mg every 6 hours as needed Breath right strips Claritin Cepacol throat lozenges Chloraseptic throat spray Cold-Eeze- up to three times per day Cough drops, alcohol free Flonase (by prescription only) Guaifenesin Mucinex Robitussin DM (plain only, alcohol free) Saline nasal spray/drops Sudafed (pseudoephedrine) & Actifed ** use only after [redacted] weeks gestation and if you do not have high blood pressure Tylenol Vicks Vaporub Zinc  lozenges Zyrtec   Constipation: Colace Ducolax suppositories Fleet enema Glycerin suppositories Metamucil Milk of magnesia Miralax Senokot Smooth move tea  Diarrhea: Kaopectate Imodium A-D  *NO pepto Bismol  Hemorrhoids: Anusol Anusol HC Preparation H Tucks  Indigestion: Tums Maalox Mylanta  Zantac  Pepcid  Insomnia: Benadryl (alcohol free) 25mg  every 6 hours as needed Tylenol PM Unisom, no Gelcaps  Leg Cramps: Tums MagGel  Nausea/Vomiting:  Bonine Dramamine Emetrol Ginger extract Sea bands Meclizine  Nausea medication to take during pregnancy:  Unisom (doxylamine succinate 25 mg tablets) Take one tablet daily at bedtime. If symptoms are not adequately controlled, the dose can be increased to a maximum recommended dose of two tablets daily (1/2 tablet in the morning, 1/2 tablet mid-afternoon and one at bedtime). Vitamin B6 100mg  tablets. Take one tablet twice a day (up to 200 mg per day).  Skin Rashes: Aveeno products Benadryl cream or 25mg  every 6 hours as needed Calamine Lotion 1% cortisone cream  Yeast infection: Gyne-lotrimin 7 Monistat 7   **If taking multiple medications, please check labels to avoid duplicating the same active ingredients **take medication as directed on the label ** Do not exceed 4000 mg of tylenol in 24 hours **Do not take medications that contain aspirin or ibuprofen

## 2023-04-30 NOTE — Progress Notes (Signed)
[place] 12+6 wks,measurements c/w dates,FHR 151 bpm,CRL 69.67 mm,normal ovaries,FHR 151 bpm,NB present,NT 1.5 mm

## 2023-04-30 NOTE — Progress Notes (Signed)
INITIAL OBSTETRICAL VISIT Patient name: Melissa Fleming MRN [id number]  Date of birth: 07/15/1986 Chief Complaint:   Initial Prenatal Visit (Need a refill on blood pressure medication)  History of Present Illness:   Melissa Fleming is a 37 y.o. G37P0030 African American female at [redacted]w[redacted]d by [place] at 7 weeks with an Estimated Date of Delivery: 11/06/23 being seen today for her initial obstetrical visit.   Her obstetrical history is significant for 3 early SABs. W/U negative.   Today she reports no complaints.     12/26/2021   10:52 AM  Depression screen PHQ 2/9  Decreased Interest 0  Down, Depressed, Hopeless 0  PHQ - 2 Score 0  Altered sleeping 0  Tired, decreased energy 1  Change in appetite 0  Feeling bad or failure about yourself  0  Trouble concentrating 0  Moving slowly or fidgety/restless 0  Suicidal thoughts 0  PHQ-9 Score 1    No LMP recorded. Patient is pregnant. Last pap  Diagnosis  Date Value Ref Range Status  12/26/2021   Final   - Negative for intraepithelial lesion or malignancy (NILM)  High RIsk HPV  Review of Systems:   Pertinent items are noted in HPI Denies cramping/contractions, leakage of fluid, vaginal bleeding, abnormal vaginal discharge w/ itching/odor/irritation, headaches, visual changes, shortness of breath, chest pain, abdominal pain, severe nausea/vomiting, or problems with urination or bowel movements unless otherwise stated above.  Pertinent History Reviewed:  Reviewed past medical,surgical, social, obstetrical and family history.  Reviewed problem list, medications and allergies. OB History  Gravida Para Term Preterm AB Living  4       3    SAB IAB Ectopic Multiple Live Births  3            # Outcome Date GA Lbr Len/2nd Weight Sex Type Anes PTL Lv  4 Current           3 SAB 12/14/21          2 SAB           1 SAB         FD     Birth Comments: 5 months    Obstetric Comments  -2nd miscarriage- she was around 5mos.  Had delivery at hospital,  did not know she was pregnant   Physical Assessment:   Vitals:   04/30/23 1538 04/30/23 1545  BP: (!) 140/97 (!) 142/98  Pulse: 80 82  Weight: 196 lb (88.9 kg)   Body mass index is 38.28 kg/m.       Physical Examination:  General appearance - well appearing, and in no distress  Mental status - alert, oriented to person, place, and time  Psych:  She has a normal mood and affect  Skin - warm and dry, normal color, no suspicious lesions noted  Chest - effort normal  Heart - normal rate and regular rhythm  Abdomen - soft, nontender  Extremities:  No swelling or varicosities noted  Pelvic - VULVA: normal appearing vulva with no masses, tenderness or lesions  VAGINA: normal appearing vagina with normal color and discharge, no lesions  CERVIX: normal appearing cervix without discharge or lesions, no CMT  Thin prep pap is done with HR HPV cotesting  TODAY'S NT   [place] 12+6 wks,measurements c/w dates,FHR 151 bpm,CRL 69.67 mm,normal ovaries,FHR 151 bpm,NB present,NT 1.5 mm    No results found for this or any previous visit (from the past 24 hour(s)).   I    12/26/2021  10:52 AM  Depression screen PHQ 2/9  Decreased Interest 0  Down, Depressed, Hopeless 0  PHQ - 2 Score 0  Altered sleeping 0  Tired, decreased energy 1  Change in appetite 0  Feeling bad or failure about yourself  0  Trouble concentrating 0  Moving slowly or fidgety/restless 0  Suicidal thoughts 0  PHQ-9 Score 1    [id number]    12/26/2021   10:53 AM  GAD 7 : Generalized Anxiety Score  Nervous, Anxious, on Edge 0  Control/stop worrying 0  Worry too much - different things 0  Trouble relaxing 0  Restless 0  Easily annoyed or irritable 0  Afraid - awful might happen 0  Total GAD 7 Score 0      Assessment & Plan:  1) High-Risk Pregnancy G4P0030 at [redacted]w[redacted]d with an Estimated Date of Delivery: 11/06/23   2) Initial OB visit    1. Supervision of high risk pregnancy in first trimester   2. [redacted] weeks  gestation of pregnancy   3. Essential hypertension Increase labetalol to 300mg  BID  (don't want to drop her to low as she has rarely been normotensive) . Will log into Babyscripts next week.  start ASA 162mg   4. PCOS (polycystic ovarian syndrome)  5. Family history of diabetes mellitus (DM)   6. BMI 36.0-36.9,adult   7. Screening for cervical cancer Pap collected today.    8. Screening for STD (sexually transmitted disease)        Meds:  Meds ordered this encounter  Medications   DISCONTD: labetalol (NORMODYNE) 200 MG tablet    Sig: Take 1 tablet (200 mg total) by mouth 2 (two) times daily.    Dispense:  180 tablet    Refill:  3    Order Specific Question:   Supervising Provider    Answer:   Lazaro Arms [[id number]]   aspirin EC 81 MG tablet    Sig: Take 2 tablets (162 mg total) by mouth daily.    Dispense:  60 tablet    Refill:  6    Order Specific Question:   Supervising Provider    Answer:   Duane Lope H [[id number]]   labetalol (NORMODYNE) 300 MG tablet    Sig: Take 1 tablet (300 mg total) by mouth 2 (two) times daily.    Dispense:  90 tablet    Refill:  3    Order Specific Question:   Supervising Provider    Answer:   Duane Lope H [[id number]]    Initial labs obtained Continue prenatal vitamins Reviewed n/v relief measures and warning s/s to report Reviewed recommended weight gain based on pre-gravid BMI Encouraged well-balanced diet Genetic & carrier screening discussed: requests Panorama, NT/IT, and Horizon , declines AFP Ultrasound discussed; fetal survey: requested CCNC completed> form faxed if has or is planning to apply for medicaid The nature of [facility] [employer] with multiple MDs and other Advanced Practice Providers was explained to patient; also emphasized that fellows, residents, and students are part of our team. Has home bp cuff.. Check bp weekly, let us know if >140/90.        Melissa Fleming 4:08 PM

## 2023-05-02 ENCOUNTER — Encounter: Payer: Self-pay | Admitting: Family Medicine

## 2023-05-02 LAB — COMPREHENSIVE METABOLIC PANEL: BUN/Creatinine Ratio: 15 (ref 9–23)

## 2023-05-02 LAB — CBC/D/PLT+RPR+RH+ABO+RUBIGG...: Neutrophils: 75 %

## 2023-05-05 ENCOUNTER — Encounter: Payer: Self-pay | Admitting: *Deleted

## 2023-05-10 DIAGNOSIS — R202 Paresthesia of skin: Secondary | ICD-10-CM | POA: Diagnosis not present

## 2023-05-10 DIAGNOSIS — O162 Unspecified maternal hypertension, second trimester: Secondary | ICD-10-CM | POA: Diagnosis not present

## 2023-05-10 DIAGNOSIS — Z87891 Personal history of nicotine dependence: Secondary | ICD-10-CM | POA: Diagnosis not present

## 2023-05-10 DIAGNOSIS — R9431 Abnormal electrocardiogram [ECG] [EKG]: Secondary | ICD-10-CM | POA: Diagnosis not present

## 2023-05-10 DIAGNOSIS — Z79899 Other long term (current) drug therapy: Secondary | ICD-10-CM | POA: Diagnosis not present

## 2023-05-10 DIAGNOSIS — L720 Epidermal cyst: Secondary | ICD-10-CM | POA: Diagnosis not present

## 2023-05-10 DIAGNOSIS — Z885 Allergy status to narcotic agent status: Secondary | ICD-10-CM | POA: Diagnosis not present

## 2023-05-10 DIAGNOSIS — L728 Other follicular cysts of the skin and subcutaneous tissue: Secondary | ICD-10-CM | POA: Diagnosis not present

## 2023-05-10 DIAGNOSIS — Z3A14 14 weeks gestation of pregnancy: Secondary | ICD-10-CM | POA: Diagnosis not present

## 2023-05-10 DIAGNOSIS — J9811 Atelectasis: Secondary | ICD-10-CM | POA: Diagnosis not present

## 2023-05-10 DIAGNOSIS — O2692 Pregnancy related conditions, unspecified, second trimester: Secondary | ICD-10-CM | POA: Diagnosis not present

## 2023-05-18 ENCOUNTER — Encounter: Payer: Self-pay | Admitting: *Deleted

## 2023-05-21 ENCOUNTER — Ambulatory Visit (INDEPENDENT_AMBULATORY_CARE_PROVIDER_SITE_OTHER): Payer: Medicaid Other | Admitting: Obstetrics & Gynecology

## 2023-05-21 ENCOUNTER — Encounter: Payer: Self-pay | Admitting: Obstetrics & Gynecology

## 2023-05-21 VITALS — BP 133/82 | HR 84 | Wt 198.2 lb

## 2023-05-21 DIAGNOSIS — O09522 Supervision of elderly multigravida, second trimester: Secondary | ICD-10-CM | POA: Diagnosis not present

## 2023-05-21 DIAGNOSIS — O0992 Supervision of high risk pregnancy, unspecified, second trimester: Secondary | ICD-10-CM

## 2023-05-21 DIAGNOSIS — I10 Essential (primary) hypertension: Secondary | ICD-10-CM

## 2023-05-21 DIAGNOSIS — Z3A15 15 weeks gestation of pregnancy: Secondary | ICD-10-CM

## 2023-05-21 NOTE — Progress Notes (Signed)
HIGH-RISK PREGNANCY VISIT Patient name: Melissa Fleming MRN [id number]  Date of birth: 11-19-1985 Chief Complaint:   Routine Prenatal Visit  History of Present Illness:   Melissa Fleming is a 37 y.o. G45P0030 female at [redacted]w[redacted]d with an Estimated Date of Delivery: 11/06/23 being seen today for ongoing management of a high-risk pregnancy complicated by:  -Chronic HTN; Labetalol 300mg  TID   Struggling with carpal tunnel syndrome- already has braces, using ice/heat. Trying tylenol as needed.  Contractions: Not present. Vag. Bleeding: None.  Movement: Absent. denies leaking of fluid.      12/26/2021   10:52 AM  Depression screen PHQ 2/9  Decreased Interest 0  Down, Depressed, Hopeless 0  PHQ - 2 Score 0  Altered sleeping 0  Tired, decreased energy 1  Change in appetite 0  Feeling bad or failure about yourself  0  Trouble concentrating 0  Moving slowly or fidgety/restless 0  Suicidal thoughts 0  PHQ-9 Score 1     Current Outpatient Medications  Medication Instructions   aspirin EC 162 mg, Oral, Daily   labetalol (NORMODYNE) 300 mg, Oral, 2 times daily     Review of Systems:   Pertinent items are noted in HPI Denies abnormal vaginal discharge w/ itching/odor/irritation, headaches, visual changes, shortness of breath, chest pain, abdominal pain, severe nausea/vomiting, or problems with urination or bowel movements unless otherwise stated above. Pertinent History Reviewed:  Reviewed past medical,surgical, social, obstetrical and family history.  Reviewed problem list, medications and allergies. Physical Assessment:   Vitals:   05/21/23 0926  BP: 133/82  Pulse: 84  Weight: 198 lb 3.2 oz (89.9 kg)  Body mass index is 38.71 kg/m.           Physical Examination:   General appearance: alert, well appearing, and in no distress  Mental status: normal mood, behavior, speech, dress, motor activity, and thought processes  Skin: warm & dry   Extremities:      Cardiovascular: normal  heart rate noted  Respiratory: normal respiratory effort, no distress  Abdomen: gravid, soft, non-tender  Pelvic: Cervical exam deferred         Fetal Status: Fetal Heart Rate (bpm): 155   Movement: Absent    Fetal Surveillance Testing today: doppler   Chaperone: N/A    No results found for this or any previous visit (from the past 24 hour(s)).   Assessment & Plan:  High-risk pregnancy: G4P0030 at [redacted]w[redacted]d with an Estimated Date of Delivery: 11/06/23   1) Chronic HTN -on labetalol 300mg  TID  2) Carpal tunnel syndrome Continue conservative treatment May consider ortho for steroid injection  Meds: No orders of the defined types were placed in this encounter.   Labs/procedures today: IT-2 today  Treatment Plan:  anatomy scan scheduled, continue routine OB care  Reviewed: Preterm labor symptoms and general obstetric precautions including but not limited to vaginal bleeding, contractions, leaking of fluid and fetal movement were reviewed in detail with the patient.  All questions were answered. PT has home bp cuff. Check bp weekly, let us know if >140/90.   Follow-up: Return for as scheduled 9/9.   Future Appointments  Date Time Provider Department Center  06/15/2023  9:15 AM [facility] [place] [facility] None  06/15/2023 10:10 AM Cheral Marker, CNM [facility]    Orders Placed This Encounter  Procedures   INTEGRATED 2    Myna Hidalgo, DO Attending Obstetrician & Gynecologist, [facility] for [employer], [facility]

## 2023-05-23 ENCOUNTER — Encounter: Payer: Self-pay | Admitting: Obstetrics & Gynecology

## 2023-05-25 LAB — INTEGRATED 2
AFP MoM: 0.82
Alpha-Fetoprotein: 23.3 ng/mL
Crown Rump Length: 69.7 mm
DIA MoM: 1.68
DIA Value: 223.3 pg/mL
Estriol, Unconjugated: 0.55 ng/mL
Gest. Age on Collection Date: 13 wk
Gestational Age: 16 wk
Maternal Age at EDD: 35.6 a
Nuchal Translucency (NT): 1.5 mm
Nuchal Translucency MoM: 0.95
Number of Fetuses: 1
PAPP-A MoM: 1.59
PAPP-A Value: 1391.3 ng/mL
Test Results:: POSITIVE — AB
Weight: 196 [lb_av]
Weight: 196 [lb_av]
hCG MoM: 2.17
hCG Value: 64.5 [IU]/mL
uE3 MoM: 0.62

## 2023-05-27 ENCOUNTER — Ambulatory Visit: Payer: Medicaid Other | Admitting: Family Medicine

## 2023-06-01 ENCOUNTER — Ambulatory Visit: Payer: Medicaid Other | Admitting: Family Medicine

## 2023-06-01 DIAGNOSIS — I517 Cardiomegaly: Secondary | ICD-10-CM | POA: Diagnosis not present

## 2023-06-01 DIAGNOSIS — R1030 Lower abdominal pain, unspecified: Secondary | ICD-10-CM | POA: Diagnosis not present

## 2023-06-01 DIAGNOSIS — Z3A17 17 weeks gestation of pregnancy: Secondary | ICD-10-CM | POA: Diagnosis not present

## 2023-06-01 DIAGNOSIS — O26899 Other specified pregnancy related conditions, unspecified trimester: Secondary | ICD-10-CM | POA: Diagnosis not present

## 2023-06-01 DIAGNOSIS — O26892 Other specified pregnancy related conditions, second trimester: Secondary | ICD-10-CM | POA: Diagnosis not present

## 2023-06-01 DIAGNOSIS — R0789 Other chest pain: Secondary | ICD-10-CM | POA: Diagnosis not present

## 2023-06-01 DIAGNOSIS — R079 Chest pain, unspecified: Secondary | ICD-10-CM | POA: Diagnosis not present

## 2023-06-12 ENCOUNTER — Other Ambulatory Visit: Payer: Self-pay | Admitting: Medical Genetics

## 2023-06-12 DIAGNOSIS — Z006 Encounter for examination for normal comparison and control in clinical research program: Secondary | ICD-10-CM

## 2023-06-15 ENCOUNTER — Ambulatory Visit (INDEPENDENT_AMBULATORY_CARE_PROVIDER_SITE_OTHER): Payer: Medicaid Other | Admitting: Women's Health

## 2023-06-15 ENCOUNTER — Encounter: Payer: Self-pay | Admitting: Women's Health

## 2023-06-15 ENCOUNTER — Ambulatory Visit (INDEPENDENT_AMBULATORY_CARE_PROVIDER_SITE_OTHER): Payer: Medicaid Other

## 2023-06-15 VITALS — BP 147/103 | HR 108 | Wt 195.0 lb

## 2023-06-15 DIAGNOSIS — O0992 Supervision of high risk pregnancy, unspecified, second trimester: Secondary | ICD-10-CM

## 2023-06-15 DIAGNOSIS — Z3A19 19 weeks gestation of pregnancy: Secondary | ICD-10-CM | POA: Diagnosis not present

## 2023-06-15 DIAGNOSIS — I10 Essential (primary) hypertension: Secondary | ICD-10-CM

## 2023-06-15 DIAGNOSIS — O285 Abnormal chromosomal and genetic finding on antenatal screening of mother: Secondary | ICD-10-CM | POA: Insufficient documentation

## 2023-06-15 DIAGNOSIS — Z363 Encounter for antenatal screening for malformations: Secondary | ICD-10-CM

## 2023-06-15 MED ORDER — LABETALOL HCL 300 MG PO TABS
300.0000 mg | ORAL_TABLET | Freq: Three times a day (TID) | ORAL | 6 refills | Status: DC
Start: 2023-06-15 — End: 2023-09-11

## 2023-06-15 NOTE — Progress Notes (Signed)
HIGH-RISK PREGNANCY VISIT Patient name: Melissa Fleming MRN [id number]  Date of birth: 12-Mar-1986 Chief Complaint:   Routine Prenatal Visit and Pregnancy Ultrasound (Pain tailbone and hip)  History of Present Illness:   Melissa Fleming is a 37 y.o. G28P0030 female at [redacted]w[redacted]d with an Estimated Date of Delivery: 11/06/23 being seen today for ongoing management of a high-risk pregnancy complicated by chronic hypertension currently on labetalol 300mg  TID.    Today she reports  tailbone pain- no h/o trauma, pelvic pressure . Labetalol was increased from BID to TID, so ran out of rx early and hasn't had any meds in 2d. Lives in [place], interested in switching to office there.  Contractions: Not present.  .  Movement: Present. denies leaking of fluid.      12/26/2021   10:52 AM  Depression screen PHQ 2/9  Decreased Interest 0  Down, Depressed, Hopeless 0  PHQ - 2 Score 0  Altered sleeping 0  Tired, decreased energy 1  Change in appetite 0  Feeling bad or failure about yourself  0  Trouble concentrating 0  Moving slowly or fidgety/restless 0  Suicidal thoughts 0  PHQ-9 Score 1        12/26/2021   10:53 AM  GAD 7 : Generalized Anxiety Score  Nervous, Anxious, on Edge 0  Control/stop worrying 0  Worry too much - different things 0  Trouble relaxing 0  Restless 0  Easily annoyed or irritable 0  Afraid - awful might happen 0  Total GAD 7 Score 0     Review of Systems:   Pertinent items are noted in HPI Denies abnormal vaginal discharge w/ itching/odor/irritation, headaches, visual changes, shortness of breath, chest pain, abdominal pain, severe nausea/vomiting, or problems with urination or bowel movements unless otherwise stated above. Pertinent History Reviewed:  Reviewed past medical,surgical, social, obstetrical and family history.  Reviewed problem list, medications and allergies. Physical Assessment:   Vitals:   06/15/23 1012 06/15/23 1028  BP: (!) 160/110 (!) 147/103  Pulse: 100  (!) 108  Weight: 195 lb (88.5 kg)   Body mass index is 38.08 kg/m.           Physical Examination:   General appearance: alert, well appearing, and in no distress  Mental status: alert, oriented to person, place, and time  Skin: warm & dry   Extremities: Edema: None    Cardiovascular: normal heart rate noted  Respiratory: normal respiratory effort, no distress  Abdomen: gravid, soft, non-tender  Pelvic: Cervical exam deferred          Fetal Surveillance Testing today: [place] 19+3 wks,breech,cx 3.9 cm,SVP of fluid 5 cm,posterior placenta gr 0,normal ovaries,FHR 157 bpm,EFW 294 g 46%,anatomy complete,no obvious abnormalities   Chaperone: N/A    No results found for this or any previous visit (from the past 24 hour(s)).  Assessment & Plan:  High-risk pregnancy: G4P0030 at [redacted]w[redacted]d with an Estimated Date of Delivery: 11/06/23   1) CHTN, off meds x 2d b/c labetalol 300mg  was switched from BID to TID and she ran out early, new rx sent, start today. ASA. BP check at end of week, take meds at least 1hr before  2) Increased r/f T21 on IT, LR NIPS, normal u/s today, offered MFM referral, declines  Meds:  Meds ordered this encounter  Medications   labetalol (NORMODYNE) 300 MG tablet    Sig: Take 1 tablet (300 mg total) by mouth 3 (three) times daily.    Dispense:  90 tablet  Refill:  6    Labs/procedures today: U/S  Treatment Plan:  [place] q4wks    2x/wk testing nst/sono @ 32wks     Deliver 37-39wks (36-37wks or prn if poor control)____   Reviewed: Preterm labor symptoms and general obstetric precautions including but not limited to vaginal bleeding, contractions, leaking of fluid and fetal movement were reviewed in detail with the patient.  All questions were answered. Does have home bp cuff. Office bp cuff given: not applicable. Check bp daily, let us know if consistently >140 and/or >90.  Follow-up: Return in about 4 weeks (around 07/13/2023) for HROB, US:EFW, MD or CNM, in person- bp check  online this week w/ RN- wants to tx to [facility]-can you help.   No future appointments.  No orders of the defined types were placed in this encounter.  Cheral Marker CNM, [facility] 06/15/2023 10:43 AM

## 2023-06-15 NOTE — Patient Instructions (Signed)
NWGNFAO, thank you for choosing our office today! We appreciate the opportunity to meet your healthcare needs. You may receive a short survey by mail, e-mail, or through [employer]. If you are happy with your care we would appreciate if you could take just a few minutes to complete the survey questions. We read all of your comments and take your feedback very seriously. Thank you again for choosing our office.  Center for [employer] Team at [facility] at [facility] ([address] [place], [place] [address]) Entrance C, located off of [address] Free 24/7 valet parking  Go to [email or web address] to register for FREE online childbirth classes  Call the office [phone]) or go to [facility] if: You begin to severe cramping Your water breaks.  Sometimes it is a big gush of fluid, sometimes it is just a trickle that keeps getting your panties wet or running down your legs You have vaginal bleeding.  It is normal to have a small amount of spotting if your cervix was checked.   [facility] Pediatricians/Family Doctors [facility]): [address] Colette Ribas, [phone]           [facility]: [address], [phone]                [facility] Medicine [facility]): [address], [phone] (call to ask if accepting patients) [facility] Department: [address], [place], [phone]    [facility] Pediatricians/Family Doctors [facility]): [phone], [phone] [facility]: [address] [place], [phone] [facility] [place]: [address], [phone]  [facility] Doctors  [facility]): [phone] [facility]: [address] [phone]   [facility] Doctors [facility]: [address] [phone]  [facility] Doctors  [facility]  [facility]: [phone], [phone]  Home Blood Pressure Monitoring for Patients   Your provider has recommended that you check your blood pressure (BP) at least once a week at home. If you do not have a blood pressure cuff at home, one will be provided for you. Contact your provider if you have not received your monitor within 1 week.   Helpful Tips for Accurate Home Blood Pressure Checks  Don't smoke, exercise, or drink caffeine 30 minutes before checking your BP Use the restroom before checking your BP (a full bladder can raise your pressure) Relax in a comfortable upright chair Feet on the ground Left arm resting comfortably on a flat surface at the level of your heart Legs uncrossed Back supported Sit quietly and don't talk Place the cuff on your bare arm Adjust snuggly, so that only two fingertips can fit between your skin and the top of the cuff Check 2 readings separated by at least one minute Keep a log of your BP readings For a visual, please reference this diagram: http://ccnc.care/bpdiagram  Provider Name: [facility]     Phone: [phone]  Zone 1: ALL CLEAR  Continue to monitor your symptoms:  BP reading is less than 140 (top number) or less than 90 (bottom number)  No right upper stomach pain No headaches or seeing spots No feeling nauseated or throwing up No swelling in face and hands  Zone 2: CAUTION Call your doctor's office for any of the following:  BP reading is greater than 140 (top number) or greater than  90 (bottom number)  Stomach pain under your ribs in the middle or right side Headaches or seeing spots Feeling nauseated or throwing up Swelling in face and hands  Zone 3: EMERGENCY  Seek immediate medical care if you have any of the following:  BP reading is greater than160 (top number) or greater than 110 (bottom number) Severe headaches not improving with Tylenol Serious difficulty catching your breath Any worsening symptoms from  Zone 2     Second Trimester of Pregnancy The second trimester is from week 14 through week 27 (months 4 through 6). The second trimester is often a time when you feel your best. Your body has adjusted to being pregnant, and you begin to feel better physically. Usually, morning sickness has lessened or quit completely, you may have more energy, and you may have an increase in appetite. The second trimester is also a time when the fetus is growing rapidly. At the end of the sixth month, the fetus is about 9 inches long and weighs about 1 pounds. You will likely begin to feel the baby move (quickening) between 16 and 20 weeks of pregnancy. Body changes during your second trimester Your body continues to go through many changes during your second trimester. The changes vary from woman to woman. Your weight will continue to increase. You will notice your lower abdomen bulging out. You may begin to get stretch marks on your hips, abdomen, and breasts. You may develop headaches that can be relieved by medicines. The medicines should be approved by your health care provider. You may urinate more often because the fetus is pressing on your bladder. You may develop or continue to have heartburn as a result of your pregnancy. You may develop constipation because certain hormones are causing the muscles that push waste through your intestines to slow down. You may develop hemorrhoids or swollen, bulging veins (varicose veins). You may have back pain. This is caused by: Weight gain. Pregnancy hormones that are relaxing the joints in your pelvis. A shift in weight and the muscles that support your balance. Your breasts will continue to grow and they will continue to become tender. Your gums may bleed and may be sensitive to brushing and flossing. Dark spots or blotches (chloasma, mask of pregnancy) may develop on your face. This will likely fade after the baby is born. A dark line from your belly button to  the pubic area (linea nigra) may appear. This will likely fade after the baby is born. You may have changes in your hair. These can include thickening of your hair, rapid growth, and changes in texture. Some women also have hair loss during or after pregnancy, or hair that feels dry or thin. Your hair will most likely return to normal after your baby is born.  What to expect at prenatal visits During a routine prenatal visit: You will be weighed to make sure you and the fetus are growing normally. Your blood pressure will be taken. Your abdomen will be measured to track your baby's growth. The fetal heartbeat will be listened to. Any test results from the previous visit will be discussed.  Your health care provider may ask you: How you are feeling. If you are feeling the baby move. If you have had any abnormal symptoms, such as leaking fluid, bleeding, severe headaches, or abdominal cramping. If you are using any tobacco products, including cigarettes, chewing tobacco, and electronic cigarettes. If you have any questions.  Other tests that may be performed during  your second trimester include: Blood tests that check for: Low iron levels (anemia). High blood sugar that affects pregnant women (gestational diabetes) between 25 and 28 weeks. Rh antibodies. This is to check for a protein on red blood cells (Rh factor). Urine tests to check for infections, diabetes, or protein in the urine. An ultrasound to confirm the proper growth and development of the baby. An amniocentesis to check for possible genetic problems. Fetal screens for spina bifida and Down syndrome. HIV (human immunodeficiency virus) testing. Routine prenatal testing includes screening for HIV, unless you choose not to have this test.  Follow these instructions at home: Medicines Follow your health care provider's instructions regarding medicine use. Specific medicines may be either safe or unsafe to take during  pregnancy. Take a prenatal vitamin that contains at least 600 micrograms (mcg) of folic acid. If you develop constipation, try taking a stool softener if your health care provider approves. Eating and drinking Eat a balanced diet that includes fresh fruits and vegetables, whole grains, good sources of protein such as meat, eggs, or tofu, and low-fat dairy. Your health care provider will help you determine the amount of weight gain that is right for you. Avoid raw meat and uncooked cheese. These carry germs that can cause birth defects in the baby. If you have low calcium intake from food, talk to your health care provider about whether you should take a daily calcium supplement. Limit foods that are high in fat and processed sugars, such as fried and sweet foods. To prevent constipation: Drink enough fluid to keep your urine clear or pale yellow. Eat foods that are high in fiber, such as fresh fruits and vegetables, whole grains, and beans. Activity Exercise only as directed by your health care provider. Most women can continue their usual exercise routine during pregnancy. Try to exercise for 30 minutes at least 5 days a week. Stop exercising if you experience uterine contractions. Avoid heavy lifting, wear low heel shoes, and practice good posture. A sexual relationship may be continued unless your health care provider directs you otherwise. Relieving pain and discomfort Wear a good support bra to prevent discomfort from breast tenderness. Take warm sitz baths to soothe any pain or discomfort caused by hemorrhoids. Use hemorrhoid cream if your health care provider approves. Rest with your legs elevated if you have leg cramps or low back pain. If you develop varicose veins, wear support hose. Elevate your feet for 15 minutes, 3-4 times a day. Limit salt in your diet. Prenatal Care Write down your questions. Take them to your prenatal visits. Keep all your prenatal visits as told by your health  care provider. This is important. Safety Wear your seat belt at all times when driving. Make a list of emergency phone numbers, including numbers for family, friends, the hospital, and police and fire departments. General instructions Ask your health care provider for a referral to a local prenatal education class. Begin classes no later than the beginning of month 6 of your pregnancy. Ask for help if you have counseling or nutritional needs during pregnancy. Your health care provider can offer advice or refer you to specialists for help with various needs. Do not use hot tubs, steam rooms, or saunas. Do not douche or use tampons or scented sanitary pads. Do not cross your legs for long periods of time. Avoid cat litter boxes and soil used by cats. These carry germs that can cause birth defects in the baby and possibly loss of the  fetus by miscarriage or stillbirth. Avoid all smoking, herbs, alcohol, and unprescribed drugs. Chemicals in these products can affect the formation and growth of the baby. Do not use any products that contain nicotine or tobacco, such as cigarettes and e-cigarettes. If you need help quitting, ask your health care provider. Visit your dentist if you have not gone yet during your pregnancy. Use a soft toothbrush to brush your teeth and be gentle when you floss. Contact a health care provider if: You have dizziness. You have mild pelvic cramps, pelvic pressure, or nagging pain in the abdominal area. You have persistent nausea, vomiting, or diarrhea. You have a bad smelling vaginal discharge. You have pain when you urinate. Get help right away if: You have a fever. You are leaking fluid from your vagina. You have spotting or bleeding from your vagina. You have severe abdominal cramping or pain. You have rapid weight gain or weight loss. You have shortness of breath with chest pain. You notice sudden or extreme swelling of your face, hands, ankles, feet, or legs. You  have not felt your baby move in over an hour. You have severe headaches that do not go away when you take medicine. You have vision changes. Summary The second trimester is from week 14 through week 27 (months 4 through 6). It is also a time when the fetus is growing rapidly. Your body goes through many changes during pregnancy. The changes vary from woman to woman. Avoid all smoking, herbs, alcohol, and unprescribed drugs. These chemicals affect the formation and growth your baby. Do not use any tobacco products, such as cigarettes, chewing tobacco, and e-cigarettes. If you need help quitting, ask your health care provider. Contact your health care provider if you have any questions. Keep all prenatal visits as told by your health care provider. This is important. This information is not intended to replace advice given to you by your health care provider. Make sure you discuss any questions you have with your health care provider. Document Released: 09/16/2001 Document Revised: 02/28/2016 Document Reviewed: 11/23/2012 Elsevier Interactive Patient Education  2017 [employer].

## 2023-06-15 NOTE — Progress Notes (Signed)
[place] 19+3 wks,breech,cx 3.9 cm,SVP of fluid 5 cm,posterior placenta gr 0,normal ovaries,FHR 157 bpm,EFW 294 g 46%,anatomy complete,no obvious abnormalities

## 2023-06-16 ENCOUNTER — Other Ambulatory Visit: Payer: Self-pay | Admitting: Women's Health

## 2023-06-16 DIAGNOSIS — O0992 Supervision of high risk pregnancy, unspecified, second trimester: Secondary | ICD-10-CM

## 2023-06-16 DIAGNOSIS — I10 Essential (primary) hypertension: Secondary | ICD-10-CM

## 2023-06-19 ENCOUNTER — Telehealth: Payer: Medicaid Other

## 2023-07-02 ENCOUNTER — Encounter: Payer: Self-pay | Admitting: Obstetrics and Gynecology

## 2023-07-02 ENCOUNTER — Other Ambulatory Visit: Payer: Self-pay

## 2023-07-02 ENCOUNTER — Ambulatory Visit: Payer: Medicaid Other | Admitting: Obstetrics and Gynecology

## 2023-07-02 VITALS — BP 111/72 | HR 89 | Wt 200.3 lb

## 2023-07-02 DIAGNOSIS — I10 Essential (primary) hypertension: Secondary | ICD-10-CM

## 2023-07-02 DIAGNOSIS — O285 Abnormal chromosomal and genetic finding on antenatal screening of mother: Secondary | ICD-10-CM

## 2023-07-02 DIAGNOSIS — Z3A21 21 weeks gestation of pregnancy: Secondary | ICD-10-CM

## 2023-07-02 DIAGNOSIS — O0992 Supervision of high risk pregnancy, unspecified, second trimester: Secondary | ICD-10-CM

## 2023-07-02 NOTE — Progress Notes (Signed)
Subjective:  Melissa Fleming is a 37 y.o. G4P0030 at [redacted]w[redacted]d being seen today for ongoing prenatal care.  She is currently monitored for the following issues for this high-risk pregnancy and has Essential hypertension; PCOS (polycystic ovarian syndrome); Supervision of high-risk pregnancy; BMI 36.0-36.9,adult; and Abnormal chromosomal and genetic finding on antenatal screening mother on their problem list.  Patient reports carpal tunnel symptoms.  Contractions: Not present. Vag. Bleeding: None.  Movement: Present. Denies leaking of fluid.   The following portions of the patient's history were reviewed and updated as appropriate: allergies, current medications, past family history, past medical history, past social history, past surgical history and problem list. Problem list updated.  Objective:   Vitals:   07/02/23 1615  BP: 111/72  Pulse: 89  Weight: 200 lb 4.8 oz (90.9 kg)    Fetal Status: Fetal Heart Rate (bpm): 153   Movement: Present     General:  Alert, oriented and cooperative. Patient is in no acute distress.  Skin: Skin is warm and dry. No rash noted.   Cardiovascular: Normal heart rate noted  Respiratory: Normal respiratory effort, no problems with respiration noted  Abdomen: Soft, gravid, appropriate for gestational age. Pain/Pressure: Absent     Pelvic:  Cervical exam deferred        Extremities: Normal range of motion.  Edema: Trace  Mental Status: Normal mood and affect. Normal behavior. Normal judgment and thought content.   Urinalysis:      Assessment and Plan:  Pregnancy: G4P0030 at [redacted]w[redacted]d  1. Supervision of high risk pregnancy in second trimester Stable Appt with Dr. Crissie Reese for wrist injection   2. Essential hypertension BP stable Continue with current Tx Growth scan next month  3. Abnormal genetic test Declined further evaluation  Preterm labor symptoms and general obstetric precautions including but not limited to vaginal bleeding, contractions, leaking  of fluid and fetal movement were reviewed in detail with the patient. Please refer to After Visit Summary for other counseling recommendations.  Return in about 4 weeks (around 07/30/2023) for OB visit appt with Dr Crissie Reese for wrist injection for CTS.   Hermina Staggers, MD

## 2023-07-04 DIAGNOSIS — O09512 Supervision of elderly primigravida, second trimester: Secondary | ICD-10-CM | POA: Diagnosis not present

## 2023-07-04 DIAGNOSIS — Z7982 Long term (current) use of aspirin: Secondary | ICD-10-CM | POA: Diagnosis not present

## 2023-07-04 DIAGNOSIS — Z3A20 20 weeks gestation of pregnancy: Secondary | ICD-10-CM | POA: Diagnosis not present

## 2023-07-04 DIAGNOSIS — O99282 Endocrine, nutritional and metabolic diseases complicating pregnancy, second trimester: Secondary | ICD-10-CM | POA: Diagnosis not present

## 2023-07-04 DIAGNOSIS — O99352 Diseases of the nervous system complicating pregnancy, second trimester: Secondary | ICD-10-CM | POA: Diagnosis not present

## 2023-07-04 DIAGNOSIS — O162 Unspecified maternal hypertension, second trimester: Secondary | ICD-10-CM | POA: Diagnosis not present

## 2023-07-04 DIAGNOSIS — G5603 Carpal tunnel syndrome, bilateral upper limbs: Secondary | ICD-10-CM | POA: Diagnosis not present

## 2023-07-04 DIAGNOSIS — Z87891 Personal history of nicotine dependence: Secondary | ICD-10-CM | POA: Diagnosis not present

## 2023-07-04 DIAGNOSIS — E282 Polycystic ovarian syndrome: Secondary | ICD-10-CM | POA: Diagnosis not present

## 2023-07-06 ENCOUNTER — Encounter: Payer: Self-pay | Admitting: Obstetrics and Gynecology

## 2023-07-14 ENCOUNTER — Ambulatory Visit (INDEPENDENT_AMBULATORY_CARE_PROVIDER_SITE_OTHER): Payer: Medicaid Other | Admitting: Family Medicine

## 2023-07-14 ENCOUNTER — Other Ambulatory Visit: Payer: Self-pay

## 2023-07-14 ENCOUNTER — Encounter: Payer: Self-pay | Admitting: Family Medicine

## 2023-07-14 VITALS — BP 149/102 | HR 102 | Wt 199.0 lb

## 2023-07-14 DIAGNOSIS — G5603 Carpal tunnel syndrome, bilateral upper limbs: Secondary | ICD-10-CM | POA: Diagnosis not present

## 2023-07-14 DIAGNOSIS — G5600 Carpal tunnel syndrome, unspecified upper limb: Secondary | ICD-10-CM | POA: Diagnosis not present

## 2023-07-14 MED ORDER — TRIAMCINOLONE ACETONIDE 10 MG/ML IJ SUSP
20.0000 mg | Freq: Once | INTRAMUSCULAR | Status: DC
Start: 2023-07-14 — End: 2023-09-26

## 2023-07-14 NOTE — Progress Notes (Signed)
Carpal Tunnel Injection Procedure Note After obtaining informed verbal and written consent for carpal tunnel injection, proper site was identified on the left wrist 1 cm medial to the palmaris longus tendon and 1 cm proximal to the distal palmar crease. The area was cleaned with alcohol and then anesthetized with hurricane spray. A mixture of 2 cc 10 mg/mL Kenalog (total of 20 mg) and 1 cc of 1% lidocaine was injected by directing the needle towards the base of the thumb at a 45 degree angle at a depth of approximately 1 cm. Mixture flowed easily and patient reported no paresthesias during the procedure. A bandaid was then applied and patient instructed to mobilize wrist for remainder of day to help solution spread. Patient tolerated the procedure well.  The other wrist was then injected in the same manner with no complications, patient tolerated the procedure well.  Of note patient was here just for injections not OB visit. BP elevated but she reported she has not taken her labetalol (takes 300 TID) at all today. No dose change for now.    1:56 PM 07/14/23   Venora Maples, MD/MPH Attending Family Medicine Physician, [facility] for [facility], [facility]

## 2023-07-15 ENCOUNTER — Ambulatory Visit: Payer: Medicaid Other

## 2023-07-15 ENCOUNTER — Other Ambulatory Visit: Payer: Self-pay | Admitting: *Deleted

## 2023-07-15 ENCOUNTER — Ambulatory Visit: Payer: Medicaid Other | Attending: Women's Health

## 2023-07-15 DIAGNOSIS — O99212 Obesity complicating pregnancy, second trimester: Secondary | ICD-10-CM | POA: Diagnosis not present

## 2023-07-15 DIAGNOSIS — Z3A23 23 weeks gestation of pregnancy: Secondary | ICD-10-CM | POA: Diagnosis not present

## 2023-07-15 DIAGNOSIS — E669 Obesity, unspecified: Secondary | ICD-10-CM

## 2023-07-15 DIAGNOSIS — O10012 Pre-existing essential hypertension complicating pregnancy, second trimester: Secondary | ICD-10-CM | POA: Diagnosis not present

## 2023-07-15 DIAGNOSIS — O0992 Supervision of high risk pregnancy, unspecified, second trimester: Secondary | ICD-10-CM | POA: Insufficient documentation

## 2023-07-15 DIAGNOSIS — O285 Abnormal chromosomal and genetic finding on antenatal screening of mother: Secondary | ICD-10-CM

## 2023-07-15 DIAGNOSIS — O2622 Pregnancy care for patient with recurrent pregnancy loss, second trimester: Secondary | ICD-10-CM | POA: Diagnosis not present

## 2023-07-15 DIAGNOSIS — I10 Essential (primary) hypertension: Secondary | ICD-10-CM | POA: Diagnosis not present

## 2023-07-17 ENCOUNTER — Ambulatory Visit: Payer: Medicaid Other | Attending: Maternal & Fetal Medicine

## 2023-07-28 ENCOUNTER — Encounter: Payer: Self-pay | Admitting: Family Medicine

## 2023-07-28 ENCOUNTER — Other Ambulatory Visit: Payer: Self-pay

## 2023-07-28 ENCOUNTER — Ambulatory Visit (INDEPENDENT_AMBULATORY_CARE_PROVIDER_SITE_OTHER): Payer: Medicaid Other | Admitting: Family Medicine

## 2023-07-28 VITALS — BP 138/85 | HR 116 | Wt 197.3 lb

## 2023-07-28 DIAGNOSIS — O26892 Other specified pregnancy related conditions, second trimester: Secondary | ICD-10-CM

## 2023-07-28 DIAGNOSIS — O26899 Other specified pregnancy related conditions, unspecified trimester: Secondary | ICD-10-CM | POA: Insufficient documentation

## 2023-07-28 DIAGNOSIS — O0992 Supervision of high risk pregnancy, unspecified, second trimester: Secondary | ICD-10-CM

## 2023-07-28 DIAGNOSIS — O285 Abnormal chromosomal and genetic finding on antenatal screening of mother: Secondary | ICD-10-CM

## 2023-07-28 DIAGNOSIS — Z3A25 25 weeks gestation of pregnancy: Secondary | ICD-10-CM

## 2023-07-28 DIAGNOSIS — G5600 Carpal tunnel syndrome, unspecified upper limb: Secondary | ICD-10-CM

## 2023-07-28 DIAGNOSIS — I10 Essential (primary) hypertension: Secondary | ICD-10-CM

## 2023-07-28 NOTE — Progress Notes (Signed)
   Subjective:  Melissa Fleming is a 37 y.o. G4P0030 at [redacted]w[redacted]d being seen today for ongoing prenatal care.  She is currently monitored for the following issues for this high-risk pregnancy and has Essential hypertension; PCOS (polycystic ovarian syndrome); Supervision of high-risk pregnancy; BMI 36.0-36.9,adult; and Abnormal chromosomal and genetic finding on antenatal screening mother on their problem list.  Patient reports no complaints.  Contractions: Not present. Vag. Bleeding: None.  Movement: Present. Denies leaking of fluid.   The following portions of the patient's history were reviewed and updated as appropriate: allergies, current medications, past family history, past medical history, past social history, past surgical history and problem list. Problem list updated.  Objective:   Vitals:   07/28/23 1101  BP: 138/85  Pulse: (!) 116  Weight: 197 lb 4.8 oz (89.5 kg)    Fetal Status: Fetal Heart Rate (bpm): 145   Movement: Present     General:  Alert, oriented and cooperative. Patient is in no acute distress.  Skin: Skin is warm and dry. No rash noted.   Cardiovascular: Normal heart rate noted  Respiratory: Normal respiratory effort, no problems with respiration noted  Abdomen: Soft, gravid, appropriate for gestational age. Pain/Pressure: Absent     Pelvic: Vag. Bleeding: None     Cervical exam deferred        Extremities: Normal range of motion.  Edema: None  Mental Status: Normal mood and affect. Normal behavior. Normal judgment and thought content.   Urinalysis:      Assessment and Plan:  Pregnancy: G4P0030 at [redacted]w[redacted]d  1. Supervision of high risk pregnancy in second trimester BP and FHR normal Discussed fasting labs for next visit  2. 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 Essential hypertension Normotensive on labetalol 300 TID Taking  ASA Following w MFM Last growth [place] 07/15/23, EFW 23%, AC 61% Antenatal testing at 32 weeks  Preterm labor symptoms and general obstetric precautions including but not limited to vaginal bleeding, contractions, leaking of fluid and fetal movement were reviewed in detail with the patient. Please refer to After Visit Summary for other counseling recommendations.  Return in 2 weeks (on 08/11/2023) for [facility], ob visit, 28 wk labs.   Venora Maples, MD

## 2023-07-28 NOTE — Patient Instructions (Signed)

## 2023-08-12 ENCOUNTER — Other Ambulatory Visit: Payer: Self-pay

## 2023-08-12 DIAGNOSIS — O0992 Supervision of high risk pregnancy, unspecified, second trimester: Secondary | ICD-10-CM

## 2023-08-14 ENCOUNTER — Other Ambulatory Visit: Payer: Self-pay

## 2023-08-14 ENCOUNTER — Ambulatory Visit (INDEPENDENT_AMBULATORY_CARE_PROVIDER_SITE_OTHER): Payer: Medicaid Other | Admitting: Family Medicine

## 2023-08-14 ENCOUNTER — Encounter: Payer: Medicaid Other | Admitting: Family Medicine

## 2023-08-14 ENCOUNTER — Other Ambulatory Visit: Payer: Medicaid Other

## 2023-08-14 VITALS — BP 140/93 | HR 94 | Wt 194.4 lb

## 2023-08-14 DIAGNOSIS — Z3A28 28 weeks gestation of pregnancy: Secondary | ICD-10-CM | POA: Diagnosis not present

## 2023-08-14 DIAGNOSIS — O0992 Supervision of high risk pregnancy, unspecified, second trimester: Secondary | ICD-10-CM

## 2023-08-14 DIAGNOSIS — I10 Essential (primary) hypertension: Secondary | ICD-10-CM

## 2023-08-14 DIAGNOSIS — O0993 Supervision of high risk pregnancy, unspecified, third trimester: Secondary | ICD-10-CM

## 2023-08-14 DIAGNOSIS — Z6836 Body mass index (BMI) 36.0-36.9, adult: Secondary | ICD-10-CM

## 2023-08-14 DIAGNOSIS — O285 Abnormal chromosomal and genetic finding on antenatal screening of mother: Secondary | ICD-10-CM

## 2023-08-14 NOTE — Progress Notes (Signed)
   PRENATAL VISIT NOTE  Subjective:  Melissa Fleming is a 37 y.o. G4P0030 at [redacted]w[redacted]d being seen today for ongoing prenatal care.  She is currently monitored for the following issues for this high-risk pregnancy and has Essential hypertension; PCOS (polycystic ovarian syndrome); Supervision of high-risk pregnancy; BMI 36.0-36.9,adult; Abnormal chromosomal and genetic finding on antenatal screening mother; and Carpal tunnel syndrome during pregnancy on their problem list.  Patient reports no bleeding, no contractions, no cramping, and no leaking.  Contractions: Not present. Vag. Bleeding: None.  Movement: Present. Denies leaking of fluid.   The following portions of the patient's history were reviewed and updated as appropriate: allergies, current medications, past family history, past medical history, past social history, past surgical history and problem list.   Objective:   Vitals:   08/14/23 0837 08/14/23 0859  BP: (!) 146/105 (!) 140/93  Pulse: 97 94  Weight: 194 lb 6.4 oz (88.2 kg)     Fetal Status: Fetal Heart Rate (bpm): 140   Movement: Present     General:  Alert, oriented and cooperative. Patient is in no acute distress.  Skin: Skin is warm and dry. No rash noted.   Cardiovascular: Normal heart rate noted  Respiratory: Normal respiratory effort, no problems with respiration noted  Abdomen: Soft, gravid, appropriate for gestational age.  Pain/Pressure: Present     Pelvic: Cervical exam deferred        Extremities: Normal range of motion.     Mental Status: Normal mood and affect. Normal behavior. Normal judgment and thought content.   Assessment and Plan:  Pregnancy: G4P0030 at [redacted]w[redacted]d 1. Supervision of high risk pregnancy in third trimester 28-week labs collected today Fetal heart rate appropriate BP elevated but patient did not take blood pressure medicines today  2. BMI 36.0-36.9,adult  3. Abnormal chromosomal and genetic finding on antenatal screening mother Declines  further testing  4. Essential hypertension On labetalol 300 mg 3 times daily.  Not taking medicine this morning due to GTT.  Patient reports blood pressure is well-controlled when on medications Patient will start routine antenatal testing at 32 weeks  Preterm labor symptoms and general obstetric precautions including but not limited to vaginal bleeding, contractions, leaking of fluid and fetal movement were reviewed in detail with the patient. Please refer to After Visit Summary for other counseling recommendations.   No follow-ups on file.  Future Appointments  Date Time Provider Department Center  08/14/2023  9:35 AM Celedonio Savage, MD [facility]  09/11/2023  2:15 PM [facility] NURSE [facility]  09/11/2023  2:30 PM [facility] US2 [facility]    Celedonio Savage, MD

## 2023-08-15 ENCOUNTER — Other Ambulatory Visit: Payer: Medicaid Other

## 2023-08-15 LAB — CBC
Hematocrit: 35.5 % (ref 34.0–46.6)
Hemoglobin: 11.3 g/dL (ref 11.1–15.9)
MCH: 29.4 pg (ref 26.6–33.0)
MCHC: 31.8 g/dL (ref 31.5–35.7)
MCV: 92 fL (ref 79–97)
Platelets: 374 10*3/uL (ref 150–450)
RBC: 3.84 x10E6/uL (ref 3.77–5.28)
RDW: 13 % (ref 11.7–15.4)
WBC: 8.2 10*3/uL (ref 3.4–10.8)

## 2023-08-15 LAB — HIV ANTIBODY (ROUTINE TESTING W REFLEX): HIV Screen 4th Generation wRfx: NONREACTIVE

## 2023-08-15 LAB — GLUCOSE TOLERANCE, 2 HOURS W/ 1HR
Glucose, 1 hour: 194 mg/dL — ABNORMAL HIGH (ref 70–179)
Glucose, 2 hour: 178 mg/dL — ABNORMAL HIGH (ref 70–152)
Glucose, Fasting: 79 mg/dL (ref 70–91)

## 2023-08-15 LAB — RPR: RPR Ser Ql: NONREACTIVE

## 2023-08-17 ENCOUNTER — Encounter: Payer: Self-pay | Admitting: General Practice

## 2023-08-17 DIAGNOSIS — O24410 Gestational diabetes mellitus in pregnancy, diet controlled: Secondary | ICD-10-CM

## 2023-08-17 MED ORDER — ACCU-CHEK SOFTCLIX LANCETS MISC: 12 refills | Status: AC

## 2023-08-17 MED ORDER — ACCU-CHEK GUIDE VI STRP: ORAL_STRIP | 12 refills | Status: AC

## 2023-08-17 MED ORDER — ACCU-CHEK GUIDE W/DEVICE KIT: 1.0000 | PACK | Freq: Once | 0 refills | Status: AC

## 2023-08-20 ENCOUNTER — Ambulatory Visit: Payer: Medicaid Other

## 2023-08-20 ENCOUNTER — Encounter: Payer: Medicaid Other | Attending: Advanced Practice Midwife | Admitting: Dietician

## 2023-08-20 ENCOUNTER — Other Ambulatory Visit: Payer: Self-pay

## 2023-08-20 DIAGNOSIS — Z3A28 28 weeks gestation of pregnancy: Secondary | ICD-10-CM | POA: Insufficient documentation

## 2023-08-20 DIAGNOSIS — O24410 Gestational diabetes mellitus in pregnancy, diet controlled: Secondary | ICD-10-CM | POA: Diagnosis not present

## 2023-08-20 DIAGNOSIS — Z713 Dietary counseling and surveillance: Secondary | ICD-10-CM | POA: Insufficient documentation

## 2023-08-20 NOTE — Progress Notes (Signed)
Patient was seen for Gestational Diabetes self-management on 08/20/2023  Start time 1425 and End time 1525  Estimated due date: 11/06/2023; [redacted]w[redacted]d  Clinical: Medications:  Current Outpatient Medications:    Accu-Chek Softclix Lancets lancets, Use as instructed QID, Disp: 100 each, Rfl: 12   aspirin EC 81 MG tablet, Take 2 tablets (162 mg total) by mouth daily., Disp: 60 tablet, Rfl: 6   glucose blood (ACCU-CHEK GUIDE) test strip, Use as instructed QID, Disp: 100 each, Rfl: 12   labetalol (NORMODYNE) 300 MG tablet, Take 1 tablet (300 mg total) by mouth 3 (three) times daily., Disp: 90 tablet, Rfl: 6  Current Facility-Administered Medications:    triamcinolone acetonide (KENALOG) 10 MG/ML injection 20 mg, 20 mg, Intra-articular, Once,    triamcinolone acetonide (KENALOG) 10 MG/ML injection 20 mg, 20 mg, Intra-articular, Once,   Medical History:  Past Medical History:  Diagnosis Date   Hypertension    PCOS (polycystic ovarian syndrome)    Prediabetes     Labs: OGTT fasting 79, 1 hour 194, 2 hour 178, A1c  Lab Results  Component Value Date   HGBA1C 5.2 04/30/2023     Dietary and Lifestyle History: Pt lives with her boyfriend. Pt reports she does the shopping and cooking. Pt reports she has been eating out more recently stating the past month. Pt reports she has been drinking pepsi for the past month and this is unusual for her to select. Pt reports she avoids beef during this pregnancy due to preferences. Pt reports she has been experiencing nausea and vomiting depending her food choices. Pt reports she is working part time in [employer] and states she walks frequently while working. Pt reports in addition she walks the dog daily for 15 minutes twice daily. Pt presents with accu chek guide me meter and states unsure when blood sugars were obtained.    Physical Activity: walks at work, walks the dog daily Stress: 5 out of 10; play with dogs, support from family, boyfriend Sleep:  interrupted  24 hr Recall:  First Meal: leftover sesame chicken, rice Snack: Second meal: leftovers or digorno pizza Snack: Third meal: wendy's 6 pice chicken nugget, strawberry lemonade Snack: Beverages: sprite, pepsi, water, flavored water  NUTRITION INTERVENTION  Nutrition education (E-1) on the following topics:   Initial Follow-up  [x]  []  Definition of Gestational Diabetes [x]  []  Why dietary management is important in controlling blood glucose [x]  []  Effects each nutrient has on blood glucose levels [x]  []  Simple carbohydrates vs complex carbohydrates [x]  []  Fluid intake [x]  []  Creating a balanced meal plan [x]  []  Carbohydrate counting  [x]  []  When to check blood glucose levels [x]  []  Proper blood glucose monitoring techniques [x]  []  Effect of stress and stress reduction techniques  [x]  []  Exercise effect on blood glucose levels, appropriate exercise during pregnancy [x]  []  Importance of limiting caffeine and abstaining from alcohol and smoking []  [x]  Medications used for blood sugar control during pregnancy [x]  []  Hypoglycemia and rule of 15 [x]  []  Postpartum self care   Patient has a meter prior to visit. Patient began testing yesterday-RD instructed to begin testing pre breakfast and 2 hours after each meal. FBS: 94 mg/dL  1- 2 Hour Postprandial: 99-159 mg/dL mg/dL   Patient instructed to monitor glucose levels: QID FBS: 60 - <= 95 mg/dL; 2 hour: <= 213 mg/dL  Patient received handouts: Nutrition Diabetes and Pregnancy Carbohydrate Counting List Blood glucose log Snack ideas for diabetes during pregnancy  Patient will be seen for follow-up  as needed.

## 2023-08-31 DIAGNOSIS — O24419 Gestational diabetes mellitus in pregnancy, unspecified control: Secondary | ICD-10-CM | POA: Insufficient documentation

## 2023-08-31 DIAGNOSIS — O24415 Gestational diabetes mellitus in pregnancy, controlled by oral hypoglycemic drugs: Secondary | ICD-10-CM | POA: Insufficient documentation

## 2023-09-11 ENCOUNTER — Ambulatory Visit (INDEPENDENT_AMBULATORY_CARE_PROVIDER_SITE_OTHER): Payer: Medicaid Other | Admitting: Obstetrics & Gynecology

## 2023-09-11 ENCOUNTER — Ambulatory Visit: Payer: Medicaid Other

## 2023-09-11 ENCOUNTER — Inpatient Hospital Stay (HOSPITAL_COMMUNITY): Payer: Medicaid Other

## 2023-09-11 ENCOUNTER — Encounter (HOSPITAL_COMMUNITY): Payer: Self-pay | Admitting: Obstetrics & Gynecology

## 2023-09-11 ENCOUNTER — Other Ambulatory Visit: Payer: Self-pay

## 2023-09-11 ENCOUNTER — Inpatient Hospital Stay (HOSPITAL_COMMUNITY)
Admission: AD | Admit: 2023-09-11 | Discharge: 2023-09-11 | Disposition: A | Discharge status: Home / Self Care | Payer: Medicaid Other | Attending: Obstetrics & Gynecology | Admitting: Obstetrics & Gynecology

## 2023-09-11 ENCOUNTER — Encounter: Payer: Self-pay | Admitting: Student

## 2023-09-11 VITALS — BP 155/111 | HR 116 | Wt 195.0 lb

## 2023-09-11 DIAGNOSIS — O24419 Gestational diabetes mellitus in pregnancy, unspecified control: Secondary | ICD-10-CM

## 2023-09-11 DIAGNOSIS — O99213 Obesity complicating pregnancy, third trimester: Secondary | ICD-10-CM

## 2023-09-11 DIAGNOSIS — O10013 Pre-existing essential hypertension complicating pregnancy, third trimester: Secondary | ICD-10-CM | POA: Diagnosis present

## 2023-09-11 DIAGNOSIS — E669 Obesity, unspecified: Secondary | ICD-10-CM

## 2023-09-11 DIAGNOSIS — O365990 Maternal care for other known or suspected poor fetal growth, unspecified trimester, not applicable or unspecified: Secondary | ICD-10-CM | POA: Insufficient documentation

## 2023-09-11 DIAGNOSIS — O285 Abnormal chromosomal and genetic finding on antenatal screening of mother: Secondary | ICD-10-CM | POA: Diagnosis not present

## 2023-09-11 DIAGNOSIS — I10 Essential (primary) hypertension: Secondary | ICD-10-CM

## 2023-09-11 DIAGNOSIS — Z79899 Other long term (current) drug therapy: Secondary | ICD-10-CM | POA: Diagnosis not present

## 2023-09-11 DIAGNOSIS — O2623 Pregnancy care for patient with recurrent pregnancy loss, third trimester: Secondary | ICD-10-CM

## 2023-09-11 DIAGNOSIS — Z3A32 32 weeks gestation of pregnancy: Secondary | ICD-10-CM

## 2023-09-11 DIAGNOSIS — O10913 Unspecified pre-existing hypertension complicating pregnancy, third trimester: Secondary | ICD-10-CM | POA: Diagnosis not present

## 2023-09-11 DIAGNOSIS — O0992 Supervision of high risk pregnancy, unspecified, second trimester: Secondary | ICD-10-CM

## 2023-09-11 DIAGNOSIS — O10919 Unspecified pre-existing hypertension complicating pregnancy, unspecified trimester: Secondary | ICD-10-CM

## 2023-09-11 LAB — COMPREHENSIVE METABOLIC PANEL
ALT: 9 U/L (ref 0–44)
AST: 12 U/L — ABNORMAL LOW (ref 15–41)
Albumin: 2.9 g/dL — ABNORMAL LOW (ref 3.5–5.0)
Alkaline Phosphatase: 81 U/L (ref 38–126)
Anion gap: 11 (ref 5–15)
BUN: 6 mg/dL (ref 6–20)
CO2: 21 mmol/L — ABNORMAL LOW (ref 22–32)
Calcium: 9.2 mg/dL (ref 8.9–10.3)
Chloride: 105 mmol/L (ref 98–111)
Creatinine, Ser: 0.59 mg/dL (ref 0.44–1.00)
GFR, Estimated: >60 mL/min (ref >60)
Glucose, Bld: 87 mg/dL (ref 70–99)
Potassium: 3.9 mmol/L (ref 3.5–5.1)
Sodium: 137 mmol/L (ref 135–145)
Total Bilirubin: 0.7 mg/dL (ref <1.2)
Total Protein: 6.9 g/dL (ref 6.5–8.1)

## 2023-09-11 LAB — PROTEIN / CREATININE RATIO, URINE
Creatinine, Urine: 175 mg/dL
Protein Creatinine Ratio: 0.07 mg/mg{creat} (ref 0.00–0.15)
Total Protein, Urine: 13 mg/dL

## 2023-09-11 LAB — CBC
HCT: 35.9 % — ABNORMAL LOW (ref 36.0–46.0)
Hemoglobin: 12.2 g/dL (ref 12.0–15.0)
MCH: 29.2 pg (ref 26.0–34.0)
MCHC: 34 g/dL (ref 30.0–36.0)
MCV: 85.9 fL (ref 80.0–100.0)
Platelets: 391 10*3/uL (ref 150–400)
RBC: 4.18 MIL/uL (ref 3.87–5.11)
RDW: 12.4 % (ref 11.5–15.5)
WBC: 7.9 10*3/uL (ref 4.0–10.5)
nRBC: 0 % (ref 0.0–0.2)

## 2023-09-11 MED ORDER — GLYBURIDE 5 MG PO TABS: 5.0000 mg | ORAL_TABLET | Freq: Every day | ORAL | 2 refills | Status: AC

## 2023-09-11 MED ORDER — LABETALOL HCL 5 MG/ML IV SOLN: 80.0000 mg | INTRAVENOUS | Status: DC | PRN

## 2023-09-11 MED ORDER — LABETALOL HCL 5 MG/ML IV SOLN: 40.0000 mg | INTRAVENOUS | Status: DC | PRN

## 2023-09-11 MED ORDER — METFORMIN HCL 500 MG PO TABS: 500.0000 mg | ORAL_TABLET | Freq: Every day | ORAL | 5 refills | Status: DC

## 2023-09-11 MED ORDER — LABETALOL HCL 100 MG PO TABS
300.0000 mg | ORAL_TABLET | Freq: Once | ORAL | Status: AC
  Administered 2023-09-11: 300 mg via ORAL
  Filled 2023-09-11: qty 3

## 2023-09-11 MED ORDER — HYDRALAZINE HCL 20 MG/ML IJ SOLN: 10.0000 mg | INTRAMUSCULAR | Status: DC | PRN

## 2023-09-11 MED ORDER — LABETALOL HCL 5 MG/ML IV SOLN
20.0000 mg | INTRAVENOUS | Status: DC | PRN
  Administered 2023-09-11: 20 mg via INTRAVENOUS
  Filled 2023-09-11: qty 4

## 2023-09-11 MED ORDER — LABETALOL HCL 300 MG PO TABS: 300.0000 mg | ORAL_TABLET | Freq: Three times a day (TID) | ORAL | 4 refills | Status: DC

## 2023-09-11 NOTE — MAU Provider Note (Signed)
History     CSN: [id number]  Arrival date and time: 09/11/23 1222   Event Date/Time   First Provider Initiated Contact with Patient 09/11/23 1322      Chief Complaint  Patient presents with   Hypertension   HPI Melissa Fleming is a 37 y.o. G4P0030 at [redacted]w[redacted]d who presents from the office for hypertension. Patient with chronic hypertension currently on labetalol 300 TID. Was in the office today & had SRBP so was sent here for evaluation. She denies headache, visual disturbance, or epigastric pain. Reports good fetal movement.  Did not take her labetalol dose this morning. Labetalol was increased from 300 BID to 300 TID in September. States she wasn't told when to take them, just that she knew it was to be 3 pills per day so takes 2 in the morning & 1 at night. Sometimes forgets her night time dose.   OB History     Gravida  4   Para      Term      Preterm      AB  3   Living         SAB  3   IAB      Ectopic      Multiple      Live Births           Obstetric Comments  -2nd miscarriage- she was around 5mos.  Had delivery at hospital, did not know she was pregnant         Past Medical History:  Diagnosis Date   Hypertension    PCOS (polycystic ovarian syndrome)    Prediabetes     Past Surgical History:  Procedure Laterality Date   WISDOM TOOTH EXTRACTION      Family History  Problem Relation Age of Onset   Diabetes Mother    Hypertension Mother    Mental illness Sister    Cancer Maternal Grandmother    Hypertension Maternal Grandmother    Cancer Maternal Grandfather        Bone   Diabetes Paternal Grandmother     Social History   Tobacco Use   Smoking status: Never   Smokeless tobacco: Never  Vaping Use   Vaping status: Never Used  Substance Use Topics   Alcohol use: Not Currently    Comment: occasionally   Drug use: No    Allergies:  Allergies  Allergen Reactions   Morphine Hives    Facility-Administered Medications Prior to  Admission  Medication Dose Route Frequency Provider Last Rate Last Admin   triamcinolone acetonide (KENALOG) 10 MG/ML injection 20 mg  20 mg Intra-articular Once        triamcinolone acetonide (KENALOG) 10 MG/ML injection 20 mg  20 mg Intra-articular Once        Medications Prior to Admission  Medication Sig Dispense Refill Last Dose   Accu-Chek Softclix Lancets lancets Use as instructed QID 100 each 12 09/10/2023   aspirin EC 81 MG tablet Take 2 tablets (162 mg total) by mouth daily. 60 tablet 6 09/10/2023   Blood Glucose Monitoring Suppl (ACCU-CHEK GUIDE) w/Device KIT USE AS DIRECTED ONCE DAILY TO CHECK BLOOD SUGAR   09/10/2023   glucose blood (ACCU-CHEK GUIDE) test strip Use as instructed QID 100 each 12 09/10/2023   labetalol (NORMODYNE) 300 MG tablet Take 1 tablet (300 mg total) by mouth 3 (three) times daily. 90 tablet 6 09/10/2023   glyBURIDE (DIABETA) 5 MG tablet Take 1 tablet (5 mg total) by mouth daily  with breakfast. 30 tablet 2 Unknown    Review of Systems  All other systems reviewed and are negative.  Physical Exam   Blood pressure (!) 131/93, pulse 95, temperature 98.1 F (36.7 C), temperature source Oral, resp. rate 20, height 5' (1.524 m), weight 88.3 kg, SpO2 98%.  Patient Vitals for the past 24 hrs:  BP Temp Temp src Pulse Resp SpO2 Height Weight  09/11/23 1431 (!) 131/93 -- -- 95 -- -- -- --  09/11/23 1430 -- -- -- -- -- 98 % -- --  09/11/23 1420 -- -- -- -- -- 98 % -- --  09/11/23 1416 119/86 -- -- (!) 109 -- -- -- --  09/11/23 1410 (!) 132/95 -- -- 100 -- 98 % -- --  09/11/23 1405 -- -- -- -- -- 98 % -- --  09/11/23 1401 (!) 140/99 -- -- 94 -- -- -- --  09/11/23 1400 -- -- -- -- -- 97 % -- --  09/11/23 1356 (!) 139/100 -- -- 93 -- -- -- --  09/11/23 1350 -- -- -- -- -- 99 % -- --  09/11/23 1346 (!) 157/116 -- -- 88 -- -- -- --  09/11/23 1345 -- -- -- -- -- 99 % -- --  09/11/23 1340 -- -- -- -- -- 98 % -- --  09/11/23 1337 (!) 176/122 -- -- 91 -- -- -- --   09/11/23 1335 -- -- -- -- -- 99 % -- --  09/11/23 1331 (!) 176/122 -- -- 95 -- -- -- --  09/11/23 1330 -- -- -- -- -- 99 % -- --  09/11/23 1325 -- -- -- -- -- 99 % -- --  09/11/23 1320 -- -- -- -- -- 99 % -- --  09/11/23 1316 (!) 164/108 -- -- 94 -- -- -- --  09/11/23 1315 -- -- -- -- -- 98 % -- --  09/11/23 1310 -- -- -- -- -- 98 % -- --  09/11/23 1309 (!) 150/102 -- -- 85 -- -- -- --  09/11/23 1246 (!) 156/114 98.1 F (36.7 C) Oral (!) 101 20 100 % 5' (1.524 m) 88.3 kg     Physical Exam Vitals and nursing note reviewed.  Constitutional:      General: She is not in acute distress.    Appearance: She is well-developed. She is not ill-appearing.  HENT:     Head: Normocephalic and atraumatic.  Eyes:     General: No scleral icterus.       Right eye: No discharge.        Left eye: No discharge.     Conjunctiva/sclera: Conjunctivae normal.  Pulmonary:     Effort: Pulmonary effort is normal. No respiratory distress.  Musculoskeletal:     Right lower leg: No edema.     Left lower leg: No edema.  Neurological:     General: No focal deficit present.     Mental Status: She is alert.     Deep Tendon Reflexes:     Reflex Scores:      Patellar reflexes are 2+ on the right side and 2+ on the left side.    Comments: No clonus  Psychiatric:        Mood and Affect: Mood normal.        Behavior: Behavior normal.    NST:  Baseline: 135 bpm, Variability: Good {> 6 bpm), Accelerations: Reactive, and Decelerations: Absent  MAU Course  Procedures Results for orders placed or performed during the hospital encounter  of 09/11/23 (from the past 24 hour(s))  CBC     Status: Abnormal   Collection Time: 09/11/23 12:41 PM  Result Value Ref Range   WBC 7.9 4.0 - 10.5 K/uL   RBC 4.18 3.87 - 5.11 MIL/uL   Hemoglobin 12.2 12.0 - 15.0 g/dL   HCT 29.5 (L) 28.4 - 13.2 %   MCV 85.9 80.0 - 100.0 fL   MCH 29.2 26.0 - 34.0 pg   MCHC 34.0 30.0 - 36.0 g/dL   RDW 44.0 10.2 - 72.5 %   Platelets 391  150 - 400 K/uL   nRBC 0.0 0.0 - 0.2 %  Comprehensive metabolic panel     Status: Abnormal   Collection Time: 09/11/23 12:41 PM  Result Value Ref Range   Sodium 137 135 - 145 mmol/L   Potassium 3.9 3.5 - 5.1 mmol/L   Chloride 105 98 - 111 mmol/L   CO2 21 (L) 22 - 32 mmol/L   Glucose, Bld 87 70 - 99 mg/dL   BUN 6 6 - 20 mg/dL   Creatinine, Ser 3.66 0.44 - 1.00 mg/dL   Calcium 9.2 8.9 - 44.0 mg/dL   Total Protein 6.9 6.5 - 8.1 g/dL   Albumin 2.9 (L) 3.5 - 5.0 g/dL   AST 12 (L) 15 - 41 U/L   ALT 9 0 - 44 U/L   Alkaline Phosphatase 81 38 - 126 U/L   Total Bilirubin 0.7 <1.2 mg/dL   GFR, Estimated >34 >74 mL/min   Anion gap 11 5 - 15  Protein / creatinine ratio, urine     Status: None   Collection Time: 09/11/23  1:08 PM  Result Value Ref Range   Creatinine, Urine 175 mg/dL   Total Protein, Urine 13 mg/dL   Protein Creatinine Ratio 0.07 0.00 - 0.15 mg/mg[Cre]   [place] MFM FETAL BPP WO NON STRESS  Result Date: 09/11/2023 ----------------------------------------------------------------------  OBSTETRICS REPORT                        (Signed Final 09/11/2023 06:29 pm) ---------------------------------------------------------------------- Patient Info  ID #:       [id number]                          D.O.B.:  Apr 25, 1986 (37 yrs)(F)  Name:       Melissa Fleming                   Visit Date: 09/11/2023 03:27 pm ---------------------------------------------------------------------- Performed By  Attending:        Lin Landsman      Ref. Address:      [address]                    [place]                                                              [place], [place]  [address]  Performed By:     Isabel Caprice        [facility]  Referred By:      [facility]  ---------------------------------------------------------------------- Orders  #  Description                           Code        Ordered By  1  [place] MFM UA CORD DOPPLER                76820.02    Judeth Horn  2  [place] MFM OB FOLLOW UP                   [id number]    Judeth Horn  3  [place] MFM FETAL BPP WO NON               76819.01    Zairah Arista     STRESS ----------------------------------------------------------------------  #  Order #                     Accession #                Episode #  1  [id number]                   [id number]                 [id number]  2  [id number]                   [id number]                 [id number]  3  [id number]                   [id number]                 [id number] ---------------------------------------------------------------------- Indications  [redacted] weeks gestation of pregnancy                 Z3A.32  Abnormal chromosomal and genetic finding        O28.5  on antenatal screening of mother (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)  Obesity complicating pregnancy, third           O99.213  trimester  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) ---------------------------------------------------------------------- Fetal Evaluation  Num Of Fetuses:          1  Fetal Heart Rate(bpm):   129  Cardiac Activity:        Observed  Presentation:            Cephalic  Placenta:                Posterior  Amniotic Fluid  AFI FV:      Within normal limits  AFI Sum(cm)     %  Tile       Largest Pocket(cm)  11.3            26          5.1  RUQ(cm)       RLQ(cm)       LUQ(cm)        LLQ(cm)  1.6           5.1           2.4            2.2 ---------------------------------------------------------------------- Biophysical Evaluation  Amniotic F.V:   Within normal limits       F. Tone:         Observed  F. Movement:    Observed                   Score:           8/8  F. Breathing:   Observed  ---------------------------------------------------------------------- Biometry  BPD:      76.8  mm     G. Age:  30w 6d         11  %    CI:        76.54   %    70 - 86                                                          FL/HC:       20.5  %    19.1 - 21.3  HC:      278.1  mm     G. Age:  30w 3d        1.1  %    HC/AC:       1.06       0.96 - 1.17  AC:      263.6  mm     G. Age:  30w 3d         11  %    FL/BPD:      74.1  %    71 - 87  FL:       56.9  mm     G. Age:  29w 6d        2.6  %    FL/AC:       21.6  %    20 - 24  Est. FW:    1548   gm     3 lb 7 oz      5  % ---------------------------------------------------------------------- OB History  Blood Type:   O+  Gravidity:    4          SAB:   3 ---------------------------------------------------------------------- Gestational Age  U/S Today:     30w 3d                                        EDD:   11/17/23  Best:          Armida Sans 0d     Det. ByMarcella Dubs         EDD:   11/06/23                                      (  04/30/23) ---------------------------------------------------------------------- Doppler - Fetal Vessels  Umbilical Artery    S/D    %tile      RI    %tile      PI    %tile     PSV    ADFV    RDFV                                                     (cm/s)    3.5       88     0.7       84     1.2       92     28.7      No      No ---------------------------------------------------------------------- Impression  Follow up growth due to obesity in pregnancy  Normal interval growth with measurements consistent with  fetal growth restriction with EFW 5th%  Good fetal movement and amniotic fluid volume  The UA Dopplers are normal without evidence of AEDF or  REDF  Biophysical profile 8/8 ---------------------------------------------------------------------- Recommendations  Continue weekly testing with UAD/BPP  Repeat growth in 3 weeks. ----------------------------------------------------------------------              Lin Landsman, MD  Electronically Signed Final Report   09/11/2023 06:29 pm ----------------------------------------------------------------------   [place] MFM OB FOLLOW UP  Result Date: 09/11/2023 ----------------------------------------------------------------------  OBSTETRICS REPORT                        (Signed Final 09/11/2023 06:29 pm) ---------------------------------------------------------------------- Patient Info  ID #:       [id number]                          D.O.B.:  10-06-1986 (37 yrs)(F)  Name:       Melissa Fleming                   Visit Date: 09/11/2023 03:27 pm ---------------------------------------------------------------------- Performed By  Attending:        Lin Landsman      Ref. Address:      [address]                    [place]                                                              [place], [place]                                                              [address]  Performed By:     Isabel Caprice        [facility]  Referred By:      [facility] ---------------------------------------------------------------------- Orders  #  Description                           Code        Ordered By  1  [place] MFM UA CORD DOPPLER                76820.02    Bryar Rennie  2  [place] MFM OB FOLLOW UP                   [id number]    Judeth Horn  3  [place] MFM FETAL BPP WO NON               76819.01    Hattie Aguinaldo     STRESS ----------------------------------------------------------------------  #  Order #                     Accession #                Episode #  1  [id number]                   [id number]                 [id number]  2  [id number]                   [id number]                 [id number]  3  [id number]                   [id number]                 [id number] ---------------------------------------------------------------------- Indications  [redacted] weeks gestation of pregnancy                 Z3A.32   Abnormal chromosomal and genetic finding        O28.5  on antenatal screening of mother (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)  Obesity complicating pregnancy, third           O99.213  trimester  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) ---------------------------------------------------------------------- Fetal Evaluation  Num Of Fetuses:          1  Fetal Heart Rate(bpm):   129  Cardiac Activity:        Observed  Presentation:            Cephalic  Placenta:                Posterior  Amniotic Fluid  AFI FV:      Within normal limits  AFI Sum(cm)     %Tile       Largest Pocket(cm)  11.3            26          5.1  RUQ(cm)       RLQ(cm)       LUQ(cm)        LLQ(cm)  1.6           5.1           2.4  2.2 ---------------------------------------------------------------------- Biophysical Evaluation  Amniotic F.V:   Within normal limits       F. Tone:         Observed  F. Movement:    Observed                   Score:           8/8  F. Breathing:   Observed ---------------------------------------------------------------------- Biometry  BPD:      76.8  mm     G. Age:  30w 6d         11  %    CI:        76.54   %    70 - 86                                                          FL/HC:       20.5  %    19.1 - 21.3  HC:      278.1  mm     G. Age:  30w 3d        1.1  %    HC/AC:       1.06       0.96 - 1.17  AC:      263.6  mm     G. Age:  30w 3d         11  %    FL/BPD:      74.1  %    71 - 87  FL:       56.9  mm     G. Age:  29w 6d        2.6  %    FL/AC:       21.6  %    20 - 24  Est. FW:    1548   gm     3 lb 7 oz      5  % ---------------------------------------------------------------------- OB History  Blood Type:   O+  Gravidity:    4          SAB:   3 ---------------------------------------------------------------------- Gestational Age  U/S Today:     30w 3d                                        EDD:   11/17/23  Best:          Armida Sans 0d      Det. ByMarcella Dubs         EDD:   11/06/23                                      (04/30/23) ---------------------------------------------------------------------- Doppler - Fetal Vessels  Umbilical Artery    S/D    %tile      RI    %tile      PI    %tile     PSV    ADFV    RDFV                                                     (  cm/s)    3.5       88     0.7       84     1.2       92     28.7      No      No ---------------------------------------------------------------------- Impression  Follow up growth due to obesity in pregnancy  Normal interval growth with measurements consistent with  fetal growth restriction with EFW 5th%  Good fetal movement and amniotic fluid volume  The UA Dopplers are normal without evidence of AEDF or  REDF  Biophysical profile 8/8 ---------------------------------------------------------------------- Recommendations  Continue weekly testing with UAD/BPP  Repeat growth in 3 weeks. ----------------------------------------------------------------------              Lin Landsman, MD Electronically Signed Final Report   09/11/2023 06:29 pm ----------------------------------------------------------------------   [place] MFM UA CORD DOPPLER  Result Date: 09/11/2023 ----------------------------------------------------------------------  OBSTETRICS REPORT                        (Signed Final 09/11/2023 06:29 pm) ---------------------------------------------------------------------- Patient Info  ID #:       [id number]                          D.O.B.:  Jun 18, 1986 (37 yrs)(F)  Name:       Melissa Fleming                   Visit Date: 09/11/2023 03:27 pm ---------------------------------------------------------------------- Performed By  Attending:        Lin Landsman      Ref. Address:      [address]                    [place]                                                              [place], [place]                                                              [address]  Performed By:      Isabel Caprice        [facility]  Referred By:      [facility] ---------------------------------------------------------------------- Orders  #  Description                           Code        Ordered By  1  [place] MFM UA CORD DOPPLER                76820.02    Judeth Horn  2  [place] MFM OB FOLLOW UP                   23557.32    Judeth Horn  3  [place] MFM FETAL BPP WO NON               76819.01    Fawzi Melman     STRESS ----------------------------------------------------------------------  #  Order #                     Accession #                Episode #  1  [id number]                   [id number]                 [id number]  2  [id number]                   [id number]                 [id number]  3  [id number]                   [id number]                 [id number] ---------------------------------------------------------------------- Indications  [redacted] weeks gestation of pregnancy                 Z3A.32  Abnormal chromosomal and genetic finding        O28.5  on antenatal screening of mother (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)  Obesity complicating pregnancy, third           O99.213  trimester  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) ---------------------------------------------------------------------- Fetal Evaluation  Num Of Fetuses:          1  Fetal Heart Rate(bpm):   129  Cardiac Activity:        Observed  Presentation:            Cephalic  Placenta:                Posterior  Amniotic Fluid  AFI FV:      Within normal limits  AFI Sum(cm)     %Tile       Largest Pocket(cm)  11.3            26          5.1  RUQ(cm)       RLQ(cm)       LUQ(cm)        LLQ(cm)  1.6           5.1           2.4            2.2 ---------------------------------------------------------------------- Biophysical Evaluation  Amniotic F.V:   Within  normal limits       F. Tone:         Observed  F. Movement:    Observed                   Score:           8/8  F. Breathing:  Observed ---------------------------------------------------------------------- Biometry  BPD:      76.8  mm     G. Age:  30w 6d         11  %    CI:        76.54   %    70 - 86                                                          FL/HC:       20.5  %    19.1 - 21.3  HC:      278.1  mm     G. Age:  30w 3d        1.1  %    HC/AC:       1.06       0.96 - 1.17  AC:      263.6  mm     G. Age:  30w 3d         11  %    FL/BPD:      74.1  %    71 - 87  FL:       56.9  mm     G. Age:  29w 6d        2.6  %    FL/AC:       21.6  %    20 - 24  Est. FW:    1548   gm     3 lb 7 oz      5  % ---------------------------------------------------------------------- OB History  Blood Type:   O+  Gravidity:    4          SAB:   3 ---------------------------------------------------------------------- Gestational Age  U/S Today:     30w 3d                                        EDD:   11/17/23  Best:          Armida Sans 0d     Det. By:  Marcella Dubs         EDD:   11/06/23                                      (04/30/23) ---------------------------------------------------------------------- Doppler - Fetal Vessels  Umbilical Artery    S/D    %tile      RI    %tile      PI    %tile     PSV    ADFV    RDFV                                                     (cm/s)    3.5       88     0.7       84     1.2       92     28.7  No      No ---------------------------------------------------------------------- Impression  Follow up growth due to obesity in pregnancy  Normal interval growth with measurements consistent with  fetal growth restriction with EFW 5th%  Good fetal movement and amniotic fluid volume  The UA Dopplers are normal without evidence of AEDF or  REDF  Biophysical profile 8/8 ---------------------------------------------------------------------- Recommendations  Continue weekly testing with UAD/BPP   Repeat growth in 3 weeks. ----------------------------------------------------------------------              Lin Landsman, MD Electronically Signed Final Report   09/11/2023 06:29 pm ----------------------------------------------------------------------    MDM -CHTN. SRBPs on arrival to triage. IV antihypertensive protocol started with good response after 1 dose of labetalol (20 mg). Patient also given her oral dose of labetalol that she missed this morning. Labs reassuring & patient remained asymptomatic. Bps likely related to not taking medication as prescribed & will defer increasing her dosage at this time. Consulted with Dr. Charlotta Newton who agrees with this plan & recommends BP check next week.   -Patient missed her scheduled BPP in the office today due to being sent to MAU. BPP performed, 8/8 with normal fluid. Final read after patient discharged indicates FGR with recommendation for weekly UAD/BPP & growth in 3 weeks. Message sent to her providers for these appointments to be facilitated. FGR added to problem list.   Assessment and Plan   1. Chronic hypertension affecting pregnancy   2. Chronic hypertension   3. Chronic hypertension with exacerbation during pregnancy in third trimester   4. [redacted] weeks gestation of pregnancy    -Discussed appropriate timing of taking labetalol & changed prescription to every 8 hours (instead of TID). Encouraged patient to take as prescribed - may need dosing adjustment if BPs remain elevated with correct administration.  -Message to office for BP check next week -Reviewed s/s of preeclampsia & reasons to return to MAU  Judeth Horn 09/11/2023, 1:22 PM

## 2023-09-11 NOTE — MAU Note (Signed)
Melissa Fleming is a 37 y.o. at [redacted]w[redacted]d here in MAU reporting: BP elevated at appt this morning.  Did not take her medication this morning.  Sent over for further eval.  Denies HA, visual changes, epigastric pain or increase in swelling.  No bleeding or LOF.no pain. Onset of complaint: today Pain score: none Vitals:   09/11/23 1246  BP: (!) 156/114  Pulse: (!) 101  Resp: 20  Temp: 98.1 F (36.7 C)  SpO2: 100%     [id number] +[id number]  154 Lab orders placed from triage:    Blood work drawn prior to triage.   Was to have had [place] this afternoon.

## 2023-09-11 NOTE — Progress Notes (Signed)
   PRENATAL VISIT NOTE  Subjective:  Melissa Fleming is a 37 y.o. G4P0030 at [redacted]w[redacted]d being seen today for ongoing prenatal care.  She is currently monitored for the following issues for this high-risk pregnancy and has Essential hypertension; PCOS (polycystic ovarian syndrome); Supervision of high-risk pregnancy; BMI 36.0-36.9,adult; Abnormal chromosomal and genetic finding on antenatal screening mother; Carpal tunnel syndrome during pregnancy; and GDM (gestational diabetes mellitus) on their problem list.  Patient reports no complaints.  Contractions: Irritability. Vag. Bleeding: None.  Movement: Present. Denies leaking of fluid.   The following portions of the patient's history were reviewed and updated as appropriate: allergies, current medications, past family history, past medical history, past social history, past surgical history and problem list.   Objective:   Vitals:   09/11/23 1132 09/11/23 1145  BP: (!) 157/112 (!) 155/111  Pulse: (!) 101 (!) 116  Weight: 195 lb (88.5 kg)     Fetal Status: Fetal Heart Rate (bpm): 152   Movement: Present     General:  Alert, oriented and cooperative. Patient is in no acute distress.  Skin: Skin is warm and dry. No rash noted.   Cardiovascular: Normal heart rate noted  Respiratory: Normal respiratory effort, no problems with respiration noted  Abdomen: Soft, gravid, appropriate for gestational age.  Pain/Pressure: Present (pressure)     Pelvic: Cervical exam deferred        Extremities: Normal range of motion.  Edema: None  Mental Status: Normal mood and affect. Normal behavior. Normal judgment and thought content.   Assessment and Plan:  Pregnancy: G4P0030 at [redacted]w[redacted]d 1. Gestational diabetes mellitus (GDM) in third trimester, gestational diabetes method of control unspecified FBS< 90 with PP to 170, add diabeta Meds ordered this encounter  Medications   DISCONTD: metFORMIN (GLUCOPHAGE) 500 MG tablet    Sig: Take 1 tablet (500 mg total) by  mouth daily with breakfast.    Dispense:  60 tablet    Refill:  5   glyBURIDE (DIABETA) 5 MG tablet    Sig: Take 1 tablet (5 mg total) by mouth daily with breakfast.    Dispense:  30 tablet    Refill:  2    2. Supervision of high risk pregnancy in second trimester Elevated BP, missed her med today, needs to be seen in MAU, Dr. Celedonio Savage notified  3. Essential hypertension   Preterm labor symptoms and general obstetric precautions including but not limited to vaginal bleeding, contractions, leaking of fluid and fetal movement were reviewed in detail with the patient. Please refer to After Visit Summary for other counseling recommendations.   Return in about 1 week (around 09/18/2023).  Future Appointments  Date Time Provider Department Center  09/11/2023  2:15 PM [facility] NURSE [facility]  09/11/2023  2:30 PM [facility]2 [facility]    Scheryl Darter, MD

## 2023-09-16 ENCOUNTER — Ambulatory Visit: Payer: Medicaid Other

## 2023-09-17 ENCOUNTER — Other Ambulatory Visit: Payer: Self-pay

## 2023-09-17 ENCOUNTER — Ambulatory Visit: Payer: Medicaid Other | Admitting: *Deleted

## 2023-09-17 ENCOUNTER — Other Ambulatory Visit: Payer: Self-pay | Admitting: Family Medicine

## 2023-09-17 ENCOUNTER — Ambulatory Visit: Payer: Medicaid Other | Attending: Family Medicine

## 2023-09-17 ENCOUNTER — Other Ambulatory Visit: Payer: Self-pay | Admitting: *Deleted

## 2023-09-17 VITALS — BP 136/100 | HR 92

## 2023-09-17 DIAGNOSIS — O365990 Maternal care for other known or suspected poor fetal growth, unspecified trimester, not applicable or unspecified: Secondary | ICD-10-CM | POA: Diagnosis not present

## 2023-09-17 DIAGNOSIS — O285 Abnormal chromosomal and genetic finding on antenatal screening of mother: Secondary | ICD-10-CM

## 2023-09-17 DIAGNOSIS — Z3A32 32 weeks gestation of pregnancy: Secondary | ICD-10-CM | POA: Diagnosis not present

## 2023-09-17 DIAGNOSIS — O2623 Pregnancy care for patient with recurrent pregnancy loss, third trimester: Secondary | ICD-10-CM | POA: Diagnosis not present

## 2023-09-17 DIAGNOSIS — O99213 Obesity complicating pregnancy, third trimester: Secondary | ICD-10-CM | POA: Diagnosis not present

## 2023-09-17 DIAGNOSIS — I10 Essential (primary) hypertension: Secondary | ICD-10-CM | POA: Insufficient documentation

## 2023-09-17 DIAGNOSIS — E669 Obesity, unspecified: Secondary | ICD-10-CM | POA: Diagnosis not present

## 2023-09-17 DIAGNOSIS — O0993 Supervision of high risk pregnancy, unspecified, third trimester: Secondary | ICD-10-CM | POA: Insufficient documentation

## 2023-09-17 DIAGNOSIS — O24419 Gestational diabetes mellitus in pregnancy, unspecified control: Secondary | ICD-10-CM

## 2023-09-17 DIAGNOSIS — O10913 Unspecified pre-existing hypertension complicating pregnancy, third trimester: Secondary | ICD-10-CM

## 2023-09-18 ENCOUNTER — Telehealth: Payer: Self-pay

## 2023-09-18 NOTE — Telephone Encounter (Signed)
Spoke with patient - covered [facility] appointments for the next 2 weeks - still need to schedule the week of 12/30

## 2023-09-21 ENCOUNTER — Encounter: Payer: Self-pay | Admitting: Advanced Practice Midwife

## 2023-09-21 ENCOUNTER — Ambulatory Visit (INDEPENDENT_AMBULATORY_CARE_PROVIDER_SITE_OTHER): Payer: Medicaid Other | Admitting: Advanced Practice Midwife

## 2023-09-21 ENCOUNTER — Other Ambulatory Visit: Payer: Self-pay

## 2023-09-21 VITALS — BP 119/87 | HR 86 | Wt 197.3 lb

## 2023-09-21 DIAGNOSIS — O365990 Maternal care for other known or suspected poor fetal growth, unspecified trimester, not applicable or unspecified: Secondary | ICD-10-CM

## 2023-09-21 DIAGNOSIS — O0993 Supervision of high risk pregnancy, unspecified, third trimester: Secondary | ICD-10-CM

## 2023-09-21 DIAGNOSIS — O365930 Maternal care for other known or suspected poor fetal growth, third trimester, not applicable or unspecified: Secondary | ICD-10-CM

## 2023-09-21 DIAGNOSIS — O10919 Unspecified pre-existing hypertension complicating pregnancy, unspecified trimester: Secondary | ICD-10-CM

## 2023-09-21 DIAGNOSIS — I10 Essential (primary) hypertension: Secondary | ICD-10-CM

## 2023-09-21 DIAGNOSIS — O285 Abnormal chromosomal and genetic finding on antenatal screening of mother: Secondary | ICD-10-CM

## 2023-09-21 DIAGNOSIS — Z6836 Body mass index (BMI) 36.0-36.9, adult: Secondary | ICD-10-CM

## 2023-09-21 DIAGNOSIS — O24415 Gestational diabetes mellitus in pregnancy, controlled by oral hypoglycemic drugs: Secondary | ICD-10-CM

## 2023-09-21 DIAGNOSIS — O10013 Pre-existing essential hypertension complicating pregnancy, third trimester: Secondary | ICD-10-CM

## 2023-09-21 DIAGNOSIS — Z3A33 33 weeks gestation of pregnancy: Secondary | ICD-10-CM

## 2023-09-21 MED ORDER — LABETALOL HCL 200 MG PO TABS: 400.0000 mg | ORAL_TABLET | Freq: Three times a day (TID) | ORAL | 2 refills | Status: DC

## 2023-09-21 NOTE — Progress Notes (Signed)
   PRENATAL VISIT NOTE  Subjective:  Melissa Fleming is a 37 y.o. G4P0030 at [redacted]w[redacted]d being seen today for ongoing prenatal care.  She is currently monitored for the following issues for this high-risk pregnancy and has Chronic hypertension affecting pregnancy; PCOS (polycystic ovarian syndrome); Supervision of high-risk pregnancy; BMI 36.0-36.9,adult; Abnormal chromosomal and genetic finding on antenatal screening mother; Carpal tunnel syndrome during pregnancy; Oral hypoglycemic controlled White classification A2 gestational diabetes mellitus (GDM); and Fetal growth restriction antepartum on their problem list.  Patient reports no complaints.  Contractions: Not present. Vag. Bleeding: None.  Movement: Present. Denies leaking of fluid.   The following portions of the patient's history were reviewed and updated as appropriate: allergies, current medications, past family history, past medical history, past social history, past surgical history and problem list.   Objective:   Vitals:   09/21/23 0947 09/21/23 1015  BP: (!) 121/101 119/87  Pulse: 99 86  Weight: 197 lb 4.8 oz (89.5 kg)     Fetal Status: Fetal Heart Rate (bpm): 155   Movement: Present     General:  Alert, oriented and cooperative. Patient is in no acute distress.  Skin: Skin is warm and dry. No rash noted.   Cardiovascular: Normal heart rate noted  Respiratory: Normal respiratory effort, no problems with respiration noted  Abdomen: Soft, gravid, appropriate for gestational age.  Pain/Pressure: Absent     Pelvic: Cervical exam deferred        Extremities: Normal range of motion.  Edema: None  Mental Status: Normal mood and affect. Normal behavior. Normal judgment and thought content.   Assessment and Plan:  Pregnancy: G4P0030 at [redacted]w[redacted]d 1. Chronic hypertension  affecting pregnancy - Was taking Labetalol 300 TID  and was instructed to add additional dose of Labetalol 300 per day by MFM. Will change Rx to 400 TID.  2. Oral  hypoglycemic controlled White classification A2 gestational diabetes mellitus (GDM) (Primary) - Antenatal testing per MFM  4. Supervision of high risk pregnancy in third trimester   5. Fetal growth restriction antepartum - Weekly dopplers. Pt requests to have one week of dopplers closer to [facility] at [facility]. In-basket message sent to FT.   6. BMI 36.0-36.9,adult - Antenatal testing per MFM  7. Abnormal chromosomal and genetic finding on antenatal screening mother - Nml NIPS and [place]  8. [redacted] weeks gestation of pregnancy   Preterm labor symptoms and general obstetric precautions including but not limited to vaginal bleeding, contractions, leaking of fluid and fetal movement were reviewed in detail with the patient. Please refer to After Visit Summary for other counseling recommendations.   No follow-ups on file.  Future Appointments  Date Time Provider Department Center  09/22/2023  9:30 AM [facility] NURSE [facility]  09/22/2023  9:45 AM [facility] NST [facility]  09/22/2023 10:30 AM [facility] US7 [facility]  09/25/2023  8:30 AM [facility] NURSE [facility]  09/25/2023  8:45 AM [facility] NST [facility]  09/28/2023  1:15 PM [facility] NURSE [facility]  09/28/2023  1:30 PM [facility] US2 [facility]  09/28/2023  3:15 PM [facility] NST [facility]  10/01/2023 10:30 AM [facility] NURSE [facility]  10/01/2023 10:45 AM [facility] NST [facility]  10/08/2023  3:55 PM Sue Lush, FNP [facility]    Dorathy Kinsman, CNM

## 2023-09-22 ENCOUNTER — Ambulatory Visit: Payer: Medicaid Other | Admitting: *Deleted

## 2023-09-22 ENCOUNTER — Encounter (HOSPITAL_COMMUNITY): Payer: Self-pay | Admitting: Obstetrics & Gynecology

## 2023-09-22 ENCOUNTER — Ambulatory Visit: Payer: Medicaid Other | Attending: Obstetrics | Admitting: Obstetrics

## 2023-09-22 ENCOUNTER — Encounter: Payer: Self-pay | Admitting: Advanced Practice Midwife

## 2023-09-22 ENCOUNTER — Ambulatory Visit: Payer: Medicaid Other | Attending: Maternal & Fetal Medicine

## 2023-09-22 ENCOUNTER — Other Ambulatory Visit: Payer: Self-pay

## 2023-09-22 ENCOUNTER — Inpatient Hospital Stay (HOSPITAL_COMMUNITY)
Admission: AD | Admit: 2023-09-22 | Discharge: 2023-09-22 | Disposition: A | Discharge status: Home / Self Care | Payer: Medicaid Other | Attending: Obstetrics & Gynecology | Admitting: Obstetrics & Gynecology

## 2023-09-22 ENCOUNTER — Ambulatory Visit: Payer: Medicaid Other

## 2023-09-22 VITALS — BP 141/95 | HR 91

## 2023-09-22 DIAGNOSIS — O285 Abnormal chromosomal and genetic finding on antenatal screening of mother: Secondary | ICD-10-CM

## 2023-09-22 DIAGNOSIS — O365930 Maternal care for other known or suspected poor fetal growth, third trimester, not applicable or unspecified: Secondary | ICD-10-CM

## 2023-09-22 DIAGNOSIS — Z3A33 33 weeks gestation of pregnancy: Secondary | ICD-10-CM

## 2023-09-22 DIAGNOSIS — O26893 Other specified pregnancy related conditions, third trimester: Secondary | ICD-10-CM | POA: Insufficient documentation

## 2023-09-22 DIAGNOSIS — R519 Headache, unspecified: Secondary | ICD-10-CM | POA: Insufficient documentation

## 2023-09-22 DIAGNOSIS — O24414 Gestational diabetes mellitus in pregnancy, insulin controlled: Secondary | ICD-10-CM | POA: Insufficient documentation

## 2023-09-22 DIAGNOSIS — E669 Obesity, unspecified: Secondary | ICD-10-CM

## 2023-09-22 DIAGNOSIS — Z3689 Encounter for other specified antenatal screening: Secondary | ICD-10-CM | POA: Diagnosis not present

## 2023-09-22 DIAGNOSIS — O09523 Supervision of elderly multigravida, third trimester: Secondary | ICD-10-CM | POA: Insufficient documentation

## 2023-09-22 DIAGNOSIS — O0993 Supervision of high risk pregnancy, unspecified, third trimester: Secondary | ICD-10-CM | POA: Insufficient documentation

## 2023-09-22 DIAGNOSIS — O10013 Pre-existing essential hypertension complicating pregnancy, third trimester: Secondary | ICD-10-CM

## 2023-09-22 DIAGNOSIS — O99283 Endocrine, nutritional and metabolic diseases complicating pregnancy, third trimester: Secondary | ICD-10-CM | POA: Insufficient documentation

## 2023-09-22 DIAGNOSIS — I10 Essential (primary) hypertension: Secondary | ICD-10-CM

## 2023-09-22 DIAGNOSIS — O2623 Pregnancy care for patient with recurrent pregnancy loss, third trimester: Secondary | ICD-10-CM

## 2023-09-22 DIAGNOSIS — O280 Abnormal hematological finding on antenatal screening of mother: Secondary | ICD-10-CM

## 2023-09-22 DIAGNOSIS — O10913 Unspecified pre-existing hypertension complicating pregnancy, third trimester: Secondary | ICD-10-CM | POA: Insufficient documentation

## 2023-09-22 DIAGNOSIS — O365990 Maternal care for other known or suspected poor fetal growth, unspecified trimester, not applicable or unspecified: Secondary | ICD-10-CM

## 2023-09-22 DIAGNOSIS — O99213 Obesity complicating pregnancy, third trimester: Secondary | ICD-10-CM | POA: Diagnosis not present

## 2023-09-22 DIAGNOSIS — O24419 Gestational diabetes mellitus in pregnancy, unspecified control: Secondary | ICD-10-CM | POA: Diagnosis not present

## 2023-09-22 DIAGNOSIS — O24415 Gestational diabetes mellitus in pregnancy, controlled by oral hypoglycemic drugs: Secondary | ICD-10-CM

## 2023-09-22 LAB — COMPREHENSIVE METABOLIC PANEL
ALT: 12 U/L (ref 0–44)
AST: 16 U/L (ref 15–41)
Albumin: 3 g/dL — ABNORMAL LOW (ref 3.5–5.0)
Alkaline Phosphatase: 92 U/L (ref 38–126)
Anion gap: 9 (ref 5–15)
BUN: 7 mg/dL (ref 6–20)
CO2: 21 mmol/L — ABNORMAL LOW (ref 22–32)
Calcium: 9.2 mg/dL (ref 8.9–10.3)
Chloride: 105 mmol/L (ref 98–111)
Creatinine, Ser: 0.74 mg/dL (ref 0.44–1.00)
GFR, Estimated: >60 mL/min (ref >60)
Glucose, Bld: 55 mg/dL — ABNORMAL LOW (ref 70–99)
Potassium: 3.8 mmol/L (ref 3.5–5.1)
Sodium: 135 mmol/L (ref 135–145)
Total Bilirubin: 0.7 mg/dL (ref <1.2)
Total Protein: 7 g/dL (ref 6.5–8.1)

## 2023-09-22 LAB — GLUCOSE, CAPILLARY
Glucose-Capillary: 54 mg/dL — ABNORMAL LOW (ref 70–99)
Glucose-Capillary: 61 mg/dL — ABNORMAL LOW (ref 70–99)

## 2023-09-22 LAB — PROTEIN / CREATININE RATIO, URINE
Creatinine, Urine: 107 mg/dL
Protein Creatinine Ratio: 0.12 mg/mg{creat} (ref 0.00–0.15)
Total Protein, Urine: 13 mg/dL

## 2023-09-22 LAB — CBC WITH DIFFERENTIAL/PLATELET
Abs Immature Granulocytes: 0.01 10*3/uL (ref 0.00–0.07)
Basophils Absolute: 0 10*3/uL (ref 0.0–0.1)
Basophils Relative: 1 %
Eosinophils Absolute: 0.1 10*3/uL (ref 0.0–0.5)
Eosinophils Relative: 2 %
HCT: 34.5 % — ABNORMAL LOW (ref 36.0–46.0)
Hemoglobin: 11.8 g/dL — ABNORMAL LOW (ref 12.0–15.0)
Immature Granulocytes: 0 %
Lymphocytes Relative: 28 %
Lymphs Abs: 2.1 10*3/uL (ref 0.7–4.0)
MCH: 29.6 pg (ref 26.0–34.0)
MCHC: 34.2 g/dL (ref 30.0–36.0)
MCV: 86.5 fL (ref 80.0–100.0)
Monocytes Absolute: 0.7 10*3/uL (ref 0.1–1.0)
Monocytes Relative: 9 %
Neutro Abs: 4.5 10*3/uL (ref 1.7–7.7)
Neutrophils Relative %: 60 %
Platelets: 347 10*3/uL (ref 150–400)
RBC: 3.99 MIL/uL (ref 3.87–5.11)
RDW: 13.2 % (ref 11.5–15.5)
WBC: 7.4 10*3/uL (ref 4.0–10.5)
nRBC: 0 % (ref 0.0–0.2)

## 2023-09-22 MED ORDER — LABETALOL HCL 200 MG PO TABS: 400.0000 mg | ORAL_TABLET | Freq: Three times a day (TID) | ORAL | 3 refills | Status: DC

## 2023-09-22 MED ORDER — LABETALOL HCL 100 MG PO TABS
400.0000 mg | ORAL_TABLET | Freq: Once | ORAL | Status: AC
  Administered 2023-09-22: 400 mg via ORAL
  Filled 2023-09-22: qty 4

## 2023-09-22 NOTE — Procedures (Signed)
Melissa Fleming 05/28/1986 [redacted]w[redacted]d  Fetus A Non-Stress Test Interpretation for 09/22/23  Indication: IUGR and Chronic Hypertenstion  Fetal Heart Rate A Mode: External Baseline Rate (A): 135 bpm Variability: Moderate Accelerations: 15 x 15 Decelerations: Variable (X1) Multiple birth?: No  Uterine Activity Mode: Palpation, Toco Contraction Frequency (min): None Resting Tone Palpated: Relaxed Resting Time: Adequate  Interpretation (Fetal Testing) Nonstress Test Interpretation: Reactive Comments: Dr. Parke Poisson reviewed tracing.

## 2023-09-22 NOTE — MAU Provider Note (Signed)
History     CSN: [id number]  Arrival date and time: 09/22/23 1211   Event Date/Time   First Provider Initiated Contact with Patient 09/22/23 1259      No chief complaint on file.  Melissa Fleming , a  37 y.o. [id number] at [redacted]w[redacted]d presents to MAU after being seen in MFM with elevated BPs and a 6/8 BPP. Upon arrival patient reports that she feels fine. She notes a slight headache that she rates a 3/10 but believes that it is derived from lack of eating today. Patient denies attempting to relieve symptoms. She denies PreE symptoms, vaginal bleeding, leaking of fluid and endorses positive fetal movement. Patient reports that she has not made it to the pharmacy to pick up new doing for medication and is still currently taking 300 TID.    Report from MFM: Patient is a cHTN on Labetalol and GDM on glyburide being sent from the office for a BPP 6/8 in the presence of IUGR. Off for tone and breathing. BPs in office today were 141/95 and 127/86. Patient asymptomatic being sent to MAU for repeat PIH workup and prolonged monitoring.          OB History     Gravida  5   Para  1   Term      Preterm  1   AB  3   Living         SAB  3   IAB      Ectopic      Multiple      Live Births  0        Obstetric Comments  -2nd miscarriage- she was around 5mos.  Had delivery at hospital, did not know she was pregnant         Past Medical History:  Diagnosis Date   Hypertension    PCOS (polycystic ovarian syndrome)    Prediabetes     Past Surgical History:  Procedure Laterality Date   WISDOM TOOTH EXTRACTION      Family History  Problem Relation Age of Onset   Diabetes Mother    Hypertension Mother    Mental illness Sister    Cancer Maternal Grandmother    Hypertension Maternal Grandmother    Cancer Maternal Grandfather        Bone   Diabetes Paternal Grandmother     Social History   Tobacco Use   Smoking status: Never   Smokeless tobacco: Never  Vaping Use    Vaping status: Never Used  Substance Use Topics   Alcohol use: Not Currently    Comment: occasionally   Drug use: No    Allergies:  Allergies  Allergen Reactions   Morphine Hives    Facility-Administered Medications Prior to Admission  Medication Dose Route Frequency Provider Last Rate Last Admin   triamcinolone acetonide (KENALOG) 10 MG/ML injection 20 mg  20 mg Intra-articular Once        triamcinolone acetonide (KENALOG) 10 MG/ML injection 20 mg  20 mg Intra-articular Once        Medications Prior to Admission  Medication Sig Dispense Refill Last Dose/Taking   aspirin EC 81 MG tablet Take 2 tablets (162 mg total) by mouth daily. 60 tablet 6 09/22/2023   glyBURIDE (DIABETA) 5 MG tablet Take 1 tablet (5 mg total) by mouth daily with breakfast. 30 tablet 2 09/22/2023   labetalol (NORMODYNE) 200 MG tablet Take 2 tablets (400 mg total) by mouth 3 (three) times daily. 180 tablet 2 09/22/2023  at  8:00 AM   Accu-Chek Softclix Lancets lancets Use as instructed QID 100 each 12    Blood Glucose Monitoring Suppl (ACCU-CHEK GUIDE) w/Device KIT USE AS DIRECTED ONCE DAILY TO CHECK BLOOD SUGAR      glucose blood (ACCU-CHEK GUIDE) test strip Use as instructed QID 100 each 12     Review of Systems  Constitutional:  Negative for chills, fatigue and fever.  Eyes:  Negative for pain and visual disturbance.  Respiratory:  Negative for apnea, shortness of breath and wheezing.   Cardiovascular:  Negative for chest pain and palpitations.  Gastrointestinal:  Negative for abdominal pain, constipation, diarrhea, nausea and vomiting.  Genitourinary:  Negative for difficulty urinating, dysuria, pelvic pain, vaginal bleeding, vaginal discharge and vaginal pain.  Musculoskeletal:  Negative for back pain.  Neurological:  Negative for seizures, weakness and headaches.  Psychiatric/Behavioral:  Negative for suicidal ideas.    Physical Exam   Blood pressure (!) 155/102, pulse 82, temperature 98 F (36.7 C),  temperature source Oral, height 5' (1.524 m), weight 89.9 kg, SpO2 99%.  Physical Exam Vitals and nursing note reviewed.  Constitutional:      General: She is not in acute distress.    Appearance: Normal appearance.  HENT:     Head: Normocephalic.  Pulmonary:     Effort: Pulmonary effort is normal.  Musculoskeletal:     Cervical back: Normal range of motion.  Skin:    General: Skin is warm and dry.  Neurological:     Mental Status: She is alert and oriented to person, place, and time.  Psychiatric:        Mood and Affect: Mood normal.    FHT: 135 bpm with moderate variability. Accels present no decels noted.  Toco: UI   Patient Vitals for the past 24 hrs:  BP Temp Temp src Pulse SpO2 Height Weight  09/22/23 1516 127/83 -- -- 85 -- -- --  09/22/23 1501 (!) 140/79 -- -- 85 -- -- --  09/22/23 1449 138/85 -- -- 90 -- -- --  09/22/23 1416 (!) 156/95 -- -- 81 -- -- --  09/22/23 1400 (!) 149/90 -- -- 76 100 % -- --  09/22/23 1345 (!) 135/97 -- -- 90 99 % -- --  09/22/23 1330 (!) 142/102 -- -- 92 100 % -- --  09/22/23 1325 (!) 155/96 -- -- 85 100 % -- --  09/22/23 1301 (!) 139/93 -- -- 81 -- -- --  09/22/23 1249 (!) 150/93 -- -- 86 -- -- --  09/22/23 1227 (!) 155/102 98 F (36.7 C) Oral 82 99 % -- --  09/22/23 1221 -- -- -- -- -- 5' (1.524 m) 89.9 kg    MAU Course  Procedures Orders Placed This Encounter  Procedures   CBC with Differential/Platelet   Comprehensive metabolic panel   Protein / creatinine ratio, urine   Diet regular Room service appropriate? Yes; Fluid consistency: Thin   Results for orders placed or performed during the hospital encounter of 09/22/23 (from the past 24 hours)  Comprehensive metabolic panel     Status: Abnormal   Collection Time: 09/22/23  1:26 PM  Result Value Ref Range   Sodium 135 135 - 145 mmol/L   Potassium 3.8 3.5 - 5.1 mmol/L   Chloride 105 98 - 111 mmol/L   CO2 21 (L) 22 - 32 mmol/L   Glucose, Bld 55 (L) 70 - 99 mg/dL   BUN 7 6  - 20 mg/dL   Creatinine, Ser  0.74 0.44 - 1.00 mg/dL   Calcium 9.2 8.9 - 40.9 mg/dL   Total Protein 7.0 6.5 - 8.1 g/dL   Albumin 3.0 (L) 3.5 - 5.0 g/dL   AST 16 15 - 41 U/L   ALT 12 0 - 44 U/L   Alkaline Phosphatase 92 38 - 126 U/L   Total Bilirubin 0.7 <1.2 mg/dL   GFR, Estimated >81 >19 mL/min   Anion gap 9 5 - 15  Protein / creatinine ratio, urine     Status: None   Collection Time: 09/22/23  1:26 PM  Result Value Ref Range   Creatinine, Urine 107 mg/dL   Total Protein, Urine 13 mg/dL   Protein Creatinine Ratio 0.12 0.00 - 0.15 mg/mg[Cre]  Glucose, capillary     Status: Abnormal   Collection Time: 09/22/23  1:32 PM  Result Value Ref Range   Glucose-Capillary 54 (L) 70 - 99 mg/dL  CBC with Differential/Platelet     Status: Abnormal   Collection Time: 09/22/23  2:54 PM  Result Value Ref Range   WBC 7.4 4.0 - 10.5 K/uL   RBC 3.99 3.87 - 5.11 MIL/uL   Hemoglobin 11.8 (L) 12.0 - 15.0 g/dL   HCT 14.7 (L) 82.9 - 56.2 %   MCV 86.5 80.0 - 100.0 fL   MCH 29.6 26.0 - 34.0 pg   MCHC 34.2 30.0 - 36.0 g/dL   RDW 13.0 86.5 - 78.4 %   Platelets 347 150 - 400 K/uL   nRBC 0.0 0.0 - 0.2 %   Neutrophils Relative % 60 %   Neutro Abs 4.5 1.7 - 7.7 K/uL   Lymphocytes Relative 28 %   Lymphs Abs 2.1 0.7 - 4.0 K/uL   Monocytes Relative 9 %   Monocytes Absolute 0.7 0.1 - 1.0 K/uL   Eosinophils Relative 2 %   Eosinophils Absolute 0.1 0.0 - 0.5 K/uL   Basophils Relative 1 %   Basophils Absolute 0.0 0.0 - 0.1 K/uL   Immature Granulocytes 0 %   Abs Immature Granulocytes 0.01 0.00 - 0.07 K/uL  Glucose, capillary     Status: Abnormal   Collection Time: 09/22/23  2:54 PM  Result Value Ref Range   Glucose-Capillary 61 (L) 70 - 99 mg/dL    MDM - Normal PreE labs  - Elevated BPs in MAU but not severe range. Low suspicion for PreE. Plan to give patient 400mg  dose of labetalol in MAU.  - FHT remained Cat I for the druation of MAU stay. - Headache relieved after patient ate.  - BPs improved after  Labetalol dosing.  - Plan for discharge.   Assessment and Plan   1. Chronic hypertension   2. Supervision of high risk pregnancy in third trimester   3. Fetal growth restriction antepartum   4. [redacted] weeks gestation of pregnancy   5. Insulin controlled gestational diabetes mellitus (GDM) in third trimester   6. Pregnancy headache in third trimester   7. NST (non-stress test) reactive on fetal surveillance    - Recommended to pick up new dosing for labetalol. - Patient has a appt with MFM on Friday. Encouraged to keep her appt.  - Reviewed worsening signs and return precautions.  - FHT reactive and reassuring upon discharge.  - Patient discharged home in stable condition and may return to MAU as needed.   Claudette Head, MSN CNM  09/22/2023, 12:59 PM

## 2023-09-22 NOTE — Progress Notes (Signed)
MFM Note  Melissa Fleming is currently at 33 weeks and 4 days.  She has been followed due to an IUGR fetus with elevated umbilical artery Doppler studies.    Her pregnancy has also been complicated by advanced maternal age, gestational diabetes treated with glyburide, and chronic hypertension treated with labetalol.    She had an Integrated-2 test which indicated an increased risk for Down syndrome (due to abnormal serum proteins).  However, her cell free DNA test indicated a low risk for Down syndrome.    Her blood pressures today were 141/95 and 127/86.  She denies any signs or symptoms of preeclampsia.    A BPP performed today was 6/10 with a reactive NST.  She received a -2 for absent fetal tone and another -2 for absent fetal breathing movements.  The total AFI was 12.18 cm (within normal limits).  Doppler studies of the umbilical arteries continues to show an elevated S/D ratio of 5.97.  There were no signs of absent or reversed end-diastolic flow noted today.  Due to the BPP of 6 out of 10 and her elevated blood pressures, the patient was sent to the MAU following today's ultrasound exam for prolonged monitoring and to be worked up for preeclampsia.    The patient was advised that due to IUGR, the abnormal serum proteins noted on her Integrated-2 test, gestational diabetes, and chronic hypertension, she is at significant risk for developing preeclampsia.    In the MAU, she should have PIH labs drawn and a P/C ratio performed to ensure that she has not developed preeclampsia.    Should she be diagnosed with preeclampsia, she should be given a course of antenatal corticosteroids.  Should a course of steroids to be administered, admission to the hospital should be considered to ensure normal glycemic control.    Should her fetal status be reassuring, her PIH labs are normal, and she has ruled out for preeclampsia, she will return to our office in 3 days for another NST.    The patient  understands that delivery is the only treatment for preeclampsia.  She was advised to continue to monitor for signs and symptoms of preeclampsia.    The patient stated that all of her questions were answered today.  A total of 30 minutes was spent counseling and coordinating the care for this patient.  Greater than 50% of the time was spent in direct face-to-face contact.

## 2023-09-22 NOTE — MAU Note (Signed)
Melissa Fleming is a 37 y.o. at [redacted]w[redacted]d here in MAU reporting: sent from MFM for evaluation of Pre eclampsia. Endorses HA, denies visual disturbances and epigastric pain.  States she has a HA because she's hungry.  Denies VB or LOF.  Endorses +FM. LMP: NA Onset of complaint: today Pain score: 3 Vitals:   09/22/23 1227  BP: (!) 155/102  Pulse: 82  Temp: 98 F (36.7 C)  SpO2: 99%     FHT:131 bpm Lab orders placed from triage:   UA

## 2023-09-24 ENCOUNTER — Encounter (HOSPITAL_COMMUNITY): Payer: Self-pay | Admitting: Obstetrics & Gynecology

## 2023-09-24 ENCOUNTER — Inpatient Hospital Stay (HOSPITAL_COMMUNITY)
Admission: AD | Admit: 2023-09-24 | Discharge: 2023-09-26 | DRG: 832 | Disposition: A | Discharge status: Home / Self Care | Payer: Medicaid Other | Attending: Obstetrics & Gynecology | Admitting: Obstetrics & Gynecology

## 2023-09-24 DIAGNOSIS — O24415 Gestational diabetes mellitus in pregnancy, controlled by oral hypoglycemic drugs: Secondary | ICD-10-CM | POA: Diagnosis present

## 2023-09-24 DIAGNOSIS — O24414 Gestational diabetes mellitus in pregnancy, insulin controlled: Secondary | ICD-10-CM | POA: Diagnosis present

## 2023-09-24 DIAGNOSIS — O365930 Maternal care for other known or suspected poor fetal growth, third trimester, not applicable or unspecified: Secondary | ICD-10-CM | POA: Diagnosis not present

## 2023-09-24 DIAGNOSIS — I493 Ventricular premature depolarization: Secondary | ICD-10-CM | POA: Diagnosis present

## 2023-09-24 DIAGNOSIS — O10913 Unspecified pre-existing hypertension complicating pregnancy, third trimester: Secondary | ICD-10-CM | POA: Diagnosis present

## 2023-09-24 DIAGNOSIS — Z833 Family history of diabetes mellitus: Secondary | ICD-10-CM

## 2023-09-24 DIAGNOSIS — Z885 Allergy status to narcotic agent status: Secondary | ICD-10-CM

## 2023-09-24 DIAGNOSIS — O99413 Diseases of the circulatory system complicating pregnancy, third trimester: Secondary | ICD-10-CM | POA: Diagnosis not present

## 2023-09-24 DIAGNOSIS — O365990 Maternal care for other known or suspected poor fetal growth, unspecified trimester, not applicable or unspecified: Secondary | ICD-10-CM

## 2023-09-24 DIAGNOSIS — Z8249 Family history of ischemic heart disease and other diseases of the circulatory system: Secondary | ICD-10-CM

## 2023-09-24 DIAGNOSIS — O10919 Unspecified pre-existing hypertension complicating pregnancy, unspecified trimester: Secondary | ICD-10-CM | POA: Diagnosis present

## 2023-09-24 DIAGNOSIS — O0993 Supervision of high risk pregnancy, unspecified, third trimester: Principal | ICD-10-CM

## 2023-09-24 DIAGNOSIS — R94110 Abnormal electro-oculogram [EOG]: Secondary | ICD-10-CM | POA: Diagnosis not present

## 2023-09-24 DIAGNOSIS — I10 Essential (primary) hypertension: Secondary | ICD-10-CM

## 2023-09-24 DIAGNOSIS — Z3A33 33 weeks gestation of pregnancy: Secondary | ICD-10-CM | POA: Diagnosis not present

## 2023-09-24 DIAGNOSIS — O368130 Decreased fetal movements, third trimester, not applicable or unspecified: Secondary | ICD-10-CM | POA: Diagnosis not present

## 2023-09-24 DIAGNOSIS — Z3A34 34 weeks gestation of pregnancy: Secondary | ICD-10-CM | POA: Diagnosis not present

## 2023-09-24 DIAGNOSIS — Z7982 Long term (current) use of aspirin: Secondary | ICD-10-CM

## 2023-09-24 DIAGNOSIS — O26893 Other specified pregnancy related conditions, third trimester: Secondary | ICD-10-CM | POA: Diagnosis not present

## 2023-09-24 DIAGNOSIS — Z3689 Encounter for other specified antenatal screening: Secondary | ICD-10-CM

## 2023-09-24 DIAGNOSIS — R519 Headache, unspecified: Secondary | ICD-10-CM

## 2023-09-24 LAB — URINALYSIS, ROUTINE W REFLEX MICROSCOPIC
Bilirubin Urine: NEGATIVE
Glucose, UA: NEGATIVE mg/dL
Hgb urine dipstick: NEGATIVE
Ketones, ur: NEGATIVE mg/dL
Nitrite: NEGATIVE
Protein, ur: NEGATIVE mg/dL
Specific Gravity, Urine: 1.003 — ABNORMAL LOW (ref 1.005–1.030)
pH: 6 (ref 5.0–8.0)

## 2023-09-24 LAB — COMPREHENSIVE METABOLIC PANEL
ALT: 11 U/L (ref 0–44)
AST: 14 U/L — ABNORMAL LOW (ref 15–41)
Albumin: 2.8 g/dL — ABNORMAL LOW (ref 3.5–5.0)
Alkaline Phosphatase: 92 U/L (ref 38–126)
Anion gap: 12 (ref 5–15)
BUN: 9 mg/dL (ref 6–20)
CO2: 20 mmol/L — ABNORMAL LOW (ref 22–32)
Calcium: 9.3 mg/dL (ref 8.9–10.3)
Chloride: 104 mmol/L (ref 98–111)
Creatinine, Ser: 0.63 mg/dL (ref 0.44–1.00)
GFR, Estimated: >60 mL/min (ref >60)
Glucose, Bld: 53 mg/dL — ABNORMAL LOW (ref 70–99)
Potassium: 4.5 mmol/L (ref 3.5–5.1)
Sodium: 136 mmol/L (ref 135–145)
Total Bilirubin: 0.5 mg/dL (ref <1.2)
Total Protein: 6.5 g/dL (ref 6.5–8.1)

## 2023-09-24 LAB — CBC
HCT: 36.6 % (ref 36.0–46.0)
Hemoglobin: 12.1 g/dL (ref 12.0–15.0)
MCH: 29.2 pg (ref 26.0–34.0)
MCHC: 33.1 g/dL (ref 30.0–36.0)
MCV: 88.2 fL (ref 80.0–100.0)
Platelets: 347 10*3/uL (ref 150–400)
RBC: 4.15 MIL/uL (ref 3.87–5.11)
RDW: 13.2 % (ref 11.5–15.5)
WBC: 7.4 10*3/uL (ref 4.0–10.5)
nRBC: 0 % (ref 0.0–0.2)

## 2023-09-24 LAB — PROTEIN / CREATININE RATIO, URINE
Creatinine, Urine: 22 mg/dL
Total Protein, Urine: <6 mg/dL

## 2023-09-24 LAB — TYPE AND SCREEN
ABO/RH(D): O POS
Antibody Screen: NEGATIVE

## 2023-09-24 LAB — TSH: TSH: 1.237 u[IU]/mL (ref 0.350–4.500)

## 2023-09-24 LAB — BRAIN NATRIURETIC PEPTIDE: B Natriuretic Peptide: 74.1 pg/mL (ref 0.0–100.0)

## 2023-09-24 LAB — MAGNESIUM: Magnesium: 1.8 mg/dL (ref 1.7–2.4)

## 2023-09-24 LAB — TROPONIN I (HIGH SENSITIVITY)
Troponin I (High Sensitivity): 4 ng/L (ref <18)
Troponin I (High Sensitivity): 5 ng/L (ref <18)

## 2023-09-24 MED ORDER — BETAMETHASONE SOD PHOS & ACET 6 (3-3) MG/ML IJ SUSP
12.0000 mg | INTRAMUSCULAR | Status: DC
Start: 2023-09-24 — End: 2023-09-24

## 2023-09-24 MED ORDER — SODIUM CHLORIDE 0.9% FLUSH
3.0000 mL | Freq: Two times a day (BID) | INTRAVENOUS | Status: DC
  Administered 2023-09-24 – 2023-09-26 (×3): 3 mL via INTRAVENOUS

## 2023-09-24 MED ORDER — INSULIN ASPART 100 UNIT/ML IJ SOLN
0.0000 [IU] | Freq: Three times a day (TID) | INTRAMUSCULAR | Status: DC
  Administered 2023-09-25 (×2): 3 [IU] via SUBCUTANEOUS

## 2023-09-24 MED ORDER — SODIUM CHLORIDE 0.9% FLUSH: 3.0000 mL | INTRAVENOUS | Status: DC | PRN

## 2023-09-24 MED ORDER — LABETALOL HCL 100 MG PO TABS
400.0000 mg | ORAL_TABLET | Freq: Once | ORAL | Status: AC
  Administered 2023-09-24: 400 mg via ORAL
  Filled 2023-09-24: qty 4

## 2023-09-24 MED ORDER — ASPIRIN 81 MG PO TBEC
162.0000 mg | DELAYED_RELEASE_TABLET | Freq: Every day | ORAL | Status: DC
  Administered 2023-09-25 – 2023-09-26 (×2): 162 mg via ORAL
  Filled 2023-09-24 (×2): qty 2

## 2023-09-24 MED ORDER — LACTATED RINGERS IV SOLN: 125.0000 mL/h | INTRAVENOUS | Status: DC

## 2023-09-24 MED ORDER — LABETALOL HCL 100 MG PO TABS
400.0000 mg | ORAL_TABLET | Freq: Three times a day (TID) | ORAL | Status: DC
  Administered 2023-09-25 – 2023-09-26 (×4): 400 mg via ORAL
  Filled 2023-09-24 (×4): qty 4

## 2023-09-24 MED ORDER — CALCIUM CARBONATE ANTACID 500 MG PO CHEW: 2.0000 | CHEWABLE_TABLET | ORAL | Status: DC | PRN

## 2023-09-24 MED ORDER — PRENATAL MULTIVITAMIN CH
1.0000 | ORAL_TABLET | Freq: Every day | ORAL | Status: DC
  Administered 2023-09-25 – 2023-09-26 (×2): 1 via ORAL
  Filled 2023-09-24 (×2): qty 1

## 2023-09-24 MED ORDER — LABETALOL HCL 100 MG PO TABS: 400.0000 mg | ORAL_TABLET | Freq: Three times a day (TID) | ORAL | Status: DC

## 2023-09-24 MED ORDER — ACETAMINOPHEN-CAFFEINE 500-65 MG PO TABS
1.0000 | ORAL_TABLET | Freq: Once | ORAL | Status: AC
  Administered 2023-09-24: 1 via ORAL
  Filled 2023-09-24: qty 1

## 2023-09-24 MED ORDER — SODIUM CHLORIDE 0.9 % IV SOLN
250.0000 mL | INTRAVENOUS | Status: AC | PRN
  Administered 2023-09-24: 250 mL via INTRAVENOUS

## 2023-09-24 MED ORDER — ACETAMINOPHEN 325 MG PO TABS
650.0000 mg | ORAL_TABLET | ORAL | Status: DC | PRN
Start: 2023-09-24 — End: 2023-09-26

## 2023-09-24 MED ORDER — NIFEDIPINE ER OSMOTIC RELEASE 30 MG PO TB24
30.0000 mg | ORAL_TABLET | Freq: Every day | ORAL | Status: DC
  Administered 2023-09-24 – 2023-09-26 (×3): 30 mg via ORAL
  Filled 2023-09-24 (×3): qty 1

## 2023-09-24 MED ORDER — LABETALOL HCL 100 MG PO TABS: 200.0000 mg | ORAL_TABLET | Freq: Once | ORAL | Status: DC

## 2023-09-24 MED ORDER — MAGNESIUM SULFATE 2 GM/50ML IV SOLN
2.0000 g | Freq: Once | INTRAVENOUS | Status: AC
  Administered 2023-09-24: 2 g via INTRAVENOUS
  Filled 2023-09-24: qty 50

## 2023-09-24 NOTE — MAU Note (Signed)
.  Melissa Fleming is a 37 y.o. at [redacted]w[redacted]d here in MAU reporting: she started to become dizzy and notice pressure in her chest this morning and was instructed to return to MAU for these symptoms. She was seen in MAU on Tuesday after being sent from MFM for elevated Bps and a 6/8 BPP - sent home after reassuring lab work. Reports HA that hasn't responded to Tylenol. Also having DFM today - feeling some movement, but not her normal. Denies VB or LOF.  Home CBG was 131 after a snack.   PIH Assessment: Headache present: Yes ;Has not responded to treatment and ;Last took Tylenol at 0900 Visual disturbances: None RUQ pain/Epigastric: None Atypical edema: None Hx of HBP: GHTN BP Medications: Labetalol 200 mg (TID) and bASA, pt is taking as prescribed.   LMP: N/A Onset of complaint: Today Pain score: HA 7/10 Vitals:   09/24/23 1646  BP: (!) 150/97  Pulse: 81  Resp: 16  Temp: 98.5 F (36.9 C)  SpO2: 100%     FHT:125 Lab orders placed from triage:  UA

## 2023-09-24 NOTE — H&P (Signed)
Chief Complaint:  Decreased Fetal Movement, dizziness, and Pressure in chest    Event Date/Time   First Provider Initiated Contact with Patient 09/24/23 1709     HPI: Melissa Fleming is a 37 y.o. [id number] at [redacted]w[redacted]d who presents to maternity admissions reporting decreased fetal movement and overall "feeling different". She was seen in MAU for similar, was discharged with increased BP meds with symptoms improved with food. Since then she feels like she has been not feeling like herself with worsening headache, chest heaviness, and chest palpitations. Has been taking acetaminophen for headaches with no relief. She has also been worried that baby has not been moving as much. Fetal movements are still present but less than usual.    Denies contractions, leakage of fluid or vaginal bleeding.    Pregnancy Course:        Past Medical History:  Diagnosis Date   Hypertension     PCOS (polycystic ovarian syndrome)     Prediabetes                           OB History  Gravida Para Term Preterm AB Living   5 1   1 3      SAB IAB Ectopic Multiple Live Births      3       0         # Outcome Date GA Lbr Len/2nd Weight Sex Type Anes PTL Lv  5 Current                    4 SAB 12/14/21                  3 Preterm                    2 SAB                    1 SAB                 FD     Birth Comments: 5 months     Obstetric Comments  -2nd miscarriage- she was around 5mos.  Had delivery at hospital, did not know she was pregnant         Past Surgical History:  Procedure Laterality Date   WISDOM TOOTH EXTRACTION                 Family History  Problem Relation Age of Onset   Diabetes Mother     Hypertension Mother     Mental illness Sister     Cancer Maternal Grandmother     Hypertension Maternal Grandmother     Cancer Maternal Grandfather          Bone   Diabetes Paternal Grandmother          Social History  Social History         Tobacco Use   Smoking status: Never   Smokeless  tobacco: Never  Vaping Use   Vaping status: Never Used  Substance Use Topics   Alcohol use: Not Currently      Comment: occasionally   Drug use: No      Allergies      Allergies  Allergen Reactions   Morphine Hives               Facility-Administered Medications Prior to Admission  Medication Dose Route Frequency Provider Last Rate Last Admin  triamcinolone acetonide (KENALOG) 10 MG/ML injection 20 mg  20 mg Intra-articular Once       triamcinolone acetonide (KENALOG) 10 MG/ML injection 20 mg  20 mg Intra-articular Once                 Medications Prior to Admission  Medication Sig Dispense Refill Last Dose/Taking   aspirin EC 81 MG tablet Take 2 tablets (162 mg total) by mouth daily. 60 tablet 6 09/24/2023   glyBURIDE (DIABETA) 5 MG tablet Take 1 tablet (5 mg total) by mouth daily with breakfast. 30 tablet 2 09/24/2023   labetalol (NORMODYNE) 200 MG tablet Take 2 tablets (400 mg total) by mouth 3 (three) times daily. (Patient taking differently: Take 200 mg by mouth 3 (three) times daily.) 180 tablet 3 09/24/2023   Accu-Chek Softclix Lancets lancets Use as instructed QID 100 each 12     Blood Glucose Monitoring Suppl (ACCU-CHEK GUIDE) w/Device KIT USE AS DIRECTED ONCE DAILY TO CHECK BLOOD SUGAR         glucose blood (ACCU-CHEK GUIDE) test strip Use as instructed QID 100 each 12          I have reviewed patient's Past Medical Hx, Surgical Hx, Family Hx, Social Hx, medications and allergies.    ROS:  Review of Systems  Eyes:  Negative for photophobia.  Respiratory:  Negative for cough and shortness of breath.   Cardiovascular:  Positive for chest pain and palpitations.  Gastrointestinal:  Positive for nausea. Negative for abdominal pain.  Genitourinary:  Negative for vaginal bleeding and vaginal discharge.  Neurological:  Positive for weakness, light-headedness and headaches. Negative for numbness.      Physical Exam  Patient Vitals for the past 24 hrs:   BP Temp Temp  src Pulse Resp SpO2 Height Weight  09/24/23 1951 (!) 150/101 -- -- 85 -- 100 % -- --  09/24/23 1945 (!) 150/101 -- -- 73 -- -- -- --  09/24/23 1940 -- -- -- -- -- 99 % -- --  09/24/23 1935 -- -- -- -- -- 99 % -- --  09/24/23 1931 (!) 163/89 -- -- 5 -- -- -- --  09/24/23 1930 -- -- -- -- -- 99 % -- --  09/24/23 1925 -- -- -- -- -- 100 % -- --  09/24/23 1920 -- -- -- -- -- 100 % -- --  09/24/23 1916 -- -- -- 86 -- -- -- --  09/24/23 1915 -- -- -- -- -- 100 % -- --  09/24/23 1910 -- -- -- -- -- 100 % -- --  09/24/23 1905 -- -- -- -- -- 100 % -- --  09/24/23 1901 (!) 158/106 -- -- 84 -- -- -- --  09/24/23 1900 -- -- -- -- -- 100 % -- --  09/24/23 1846 (!) 155/91 -- -- 83 -- -- -- --  09/24/23 1831 (!) 159/102 -- -- 83 -- -- -- --  09/24/23 1830 -- -- -- -- -- 92 % -- --  09/24/23 1820 -- -- -- -- -- 99 % -- --  09/24/23 1816 (!) 149/98 -- -- 81 -- -- -- --  09/24/23 1815 -- -- -- -- -- 100 % -- --  09/24/23 1801 (!) 139/95 -- -- 82 -- -- -- --  09/24/23 1800 -- -- -- -- -- 100 % -- --  09/24/23 1755 -- -- -- -- -- 99 % -- --  09/24/23 1750 -- -- -- -- -- 100 % -- --  09/24/23 1746 Marland Kitchen)  154/105 -- -- 73 -- -- -- --  09/24/23 1745 -- -- -- -- -- 99 % -- --  09/24/23 1740 -- -- -- -- -- 100 % -- --  09/24/23 1735 -- -- -- -- -- 100 % -- --  09/24/23 1730 (!) 147/101 -- -- 83 -- 99 % -- --  09/24/23 1725 -- -- -- -- -- 100 % -- --  09/24/23 1720 -- -- -- -- -- 100 % -- --  09/24/23 1716 (!) 160/101 -- -- 76 -- -- -- --  09/24/23 1715 -- -- -- -- -- 100 % -- --  09/24/23 1710 -- -- -- -- -- 100 % -- --  09/24/23 1705 -- -- -- -- -- 99 % -- --  09/24/23 1700 (!) 132/95 -- -- 83 -- 100 % -- --  09/24/23 1655 -- -- -- -- -- 100 % -- --  09/24/23 1650 -- -- -- -- -- 100 % -- --  09/24/23 1646 (!) 150/97 98.5 F (36.9 C) Oral 81 16 100 % 5' (1.524 m) 91 kg    Constitutional: Well-developed, well-nourished female in no acute distress.  Cardiovascular: Irregular rate and rhythm, normal  S1/S2, no m/r/g. Pulses irregular.  Respiratory: normal effort GI: Abd soft, non-tender, gravid appropriate for gestational age. Pos BS x 4 MS: Extremities nontender, no edema, normal ROM Neurologic: Alert and oriented x 4.  GU: Neg CVAT.             Pelvic: NEFG, physiologic discharge, no blood, cervix clean. No CMT                 FHT:  Baseline 130s, moderate variability, accelerations present, no decelerations Contractions: none   Labs: Lab Results Last 24 Hours       Results for orders placed or performed during the hospital encounter of 09/24/23 (from the past 24 hours)  Urinalysis, Routine w reflex microscopic -Urine, Clean Catch     Status: Abnormal    Collection Time: 09/24/23  6:10 PM  Result Value Ref Range    Color, Urine STRAW (A) YELLOW    APPearance CLEAR CLEAR    Specific Gravity, Urine 1.003 (L) 1.005 - 1.030    pH 6.0 5.0 - 8.0    Glucose, UA NEGATIVE NEGATIVE mg/dL    Hgb urine dipstick NEGATIVE NEGATIVE    Bilirubin Urine NEGATIVE NEGATIVE    Ketones, ur NEGATIVE NEGATIVE mg/dL    Protein, ur NEGATIVE NEGATIVE mg/dL    Nitrite NEGATIVE NEGATIVE    Leukocytes,Ua SMALL (A) NEGATIVE    RBC / HPF 0-5 0 - 5 RBC/hpf    WBC, UA 0-5 0 - 5 WBC/hpf    Bacteria, UA RARE (A) NONE SEEN    Squamous Epithelial / HPF 0-5 0 - 5 /HPF  Protein / creatinine ratio, urine     Status: None    Collection Time: 09/24/23  6:10 PM  Result Value Ref Range    Creatinine, Urine 22 mg/dL    Total Protein, Urine <6 mg/dL    Protein Creatinine Ratio        0.00 - 0.15 mg/mg[Cre]  CBC     Status: None    Collection Time: 09/24/23  6:54 PM  Result Value Ref Range    WBC 7.4 4.0 - 10.5 K/uL    RBC 4.15 3.87 - 5.11 MIL/uL    Hemoglobin 12.1 12.0 - 15.0 g/dL    HCT 96.2 95.2 - 84.1 %    MCV  88.2 80.0 - 100.0 fL    MCH 29.2 26.0 - 34.0 pg    MCHC 33.1 30.0 - 36.0 g/dL    RDW 16.1 09.6 - 04.5 %    Platelets 347 150 - 400 K/uL    nRBC 0.0 0.0 - 0.2 %        Imaging:  [place] MFM UA  CORD DOPPLER Result Date: 09/22/2023 ----------------------------------------------------------------------  OBSTETRICS REPORT                       (Signed Final 09/22/2023 11:50 am) ---------------------------------------------------------------------- Patient Info  ID #:       [id number]                          D.O.B.:  September 21, 1986 (37 yrs)(F)  Name:       Melissa Fleming                   Visit Date: 09/22/2023 10:29 am ---------------------------------------------------------------------- Performed By  Attending:        Ma Rings MD         Ref. Address:     [address]                                                             [place], [place]                                                             [address]  Performed By:     Dennis Bast RDMS      [facility]  Referred By:      [facility] ---------------------------------------------------------------------- Orders  #  Description  Code        Ordered By  1  [place] MFM UA CORD DOPPLER                [id number]    Braxton Feathers  2  [place] MFM FETAL BPP                      [address].6     [facility]     W/NONSTRESS ----------------------------------------------------------------------  #  Order #                     Accession #                Episode #  1  [id number]                   [id number]                 [id number]  2  [id number]                   [id number]                 [id number] ---------------------------------------------------------------------- Indications  Abnormal chromosomal and genetic finding       O28.5  on antenatal screening of mother (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 on Intergrated 2 and LR NIPS on  Panorama)  Obesity complicating pregnancy, third          O99.213   trimester (BMI 38)  Poor obstetric history-Recurrent (habitual)    O26.20  abortion (3 consecutive ab's)  Encounter for other antenatal screening        Z36.2  follow-up  Neg Horizon  [redacted] weeks gestation of pregnancy                Z3A.33 ---------------------------------------------------------------------- Vital Signs  BP:          127/86 ---------------------------------------------------------------------- Fetal Evaluation  Num Of Fetuses:         1  Fetal Heart Rate(bpm):  130  Cardiac Activity:       Observed  Presentation:           Cephalic  Placenta:               Posterior Fundal  P. Cord Insertion:      Previously seen  Amniotic Fluid  AFI FV:      Within normal limits  AFI Sum(cm)     %Tile       Largest Pocket(cm)  12.18           35          3.74  RUQ(cm)       RLQ(cm)       LUQ(cm)        LLQ(cm)  3.37          2.42          2.65           3.74 ---------------------------------------------------------------------- Biophysical Evaluation  Amniotic F.V:   Within normal limits       F. Tone:        Not Observed  F. Movement:    Observed                   N.S.T:          Reactive  F. Breathing:   Not Observed               Score:  6/10 ---------------------------------------------------------------------- OB History  Blood Type:   O+  Gravidity:    4          SAB:   3 ---------------------------------------------------------------------- Gestational Age  Best:          33w 4d     Det. ByMarcella Dubs         EDD:   11/06/23                                      (04/30/23) ---------------------------------------------------------------------- Anatomy  Cranium:               Previously seen        Aortic Arch:            Previously seen  Cavum:                 Previously seen        Ductal Arch:            Previously seen  Ventricles:            Appears normal         Diaphragm:              Appears normal  Choroid Plexus:        Previously seen        Stomach:                Appears normal, left                                                                         sided  Cerebellum:            Previously seen        Abdomen:                Previously seen  Posterior Fossa:       Previously seen        Abdominal Wall:         Previously seen  Face:                  Orbits and profile     Cord Vessels:           Previously seen                         previously seen  Lips:                  Previously seen        Kidneys:                Appear normal  Thoracic:              Previously seen        Bladder:                Appears normal  Heart:                 Appears normal         Spine:  Previously seen                         (4CH, axis, and                         situs)  RVOT:                  Previously seen        Upper Extremities:      Previously seen  LVOT:                  Previously seen        Lower Extremities:      Previously seen ---------------------------------------------------------------------- Doppler - Fetal Vessels  Umbilical Artery   S/D     %tile      RI    %tile                      PSV    ADFV    RDFV                                                     (cm/s)   5.97   > 97.5    0.83   > 97.5                     28.04      No      No ---------------------------------------------------------------------- Cervix Uterus Adnexa  Cervix  Not visualized (advanced GA >24wks)  Uterus  No abnormality visualized.  Right Ovary  Not visualized.  Left Ovary  Not visualized.  Cul De Sac  No free fluid seen.  Adnexa  No abnormality visualized ---------------------------------------------------------------------- Comments  Maanvi Berch is currently at 33 weeks and 4 days.  She has  been followed due to an IUGR fetus with elevated umbilical  artery Doppler studies.  Her pregnancy has also been complicated by advanced  maternal age, gestational diabetes treated with glyburide, and  chronic hypertension treated with labetalol.  She had an Integrated-2 test which indicated an increased   risk for Down syndrome (due to abnormal serum proteins).  However, her cell free DNA test indicated a low risk for Down  syndrome.  Her blood pressures today were 141/95 and 127/86.  She  denies any signs or symptoms of preeclampsia.  A BPP performed today was 6/10 with a reactive NST.  She  received a -2 for absent fetal tone and another -2 for absent  fetal breathing movements.  The total AFI was 12.18 cm (within normal limits).  Doppler studies of the umbilical arteries continues to show an  elevated S/D ratio of 5.97.  There were no signs of absent or  reversed end-diastolic flow noted today.  Due to the BPP of 6 out of 10 and her elevated blood  pressures, the patient was sent to the MAU following today's  ultrasound exam for prolonged monitoring and to be worked  up for preeclampsia.  The patient was advised that due to IUGR, the abnormal  serum proteins noted on her Integrated-2 test, gestational  diabetes, and chronic hypertension, she is at risk for  developing preeclampsia.  In the MAU, she should have PIH labs drawn and a P/C ratio  performed to  ensure that she has not developed  preeclampsia.  Should she be diagnosed with preeclampsia, she should be  given a course of antenatal corticosteroids.  Should a course  of steroids to be administered, admission to the hospital  should be considered to ensure normal glycemic control.  Should her fetal status be reassuring, her PIH labs are  normal, and she has ruled out for preeclampsia, she will  return to our office in 3 days for another NST.  The patient understands that delivery is the only treatment for  preeclampsia.  She was advised to continue to monitor for  signs and symptoms of preeclampsia.  The patient stated that all of her questions were answered  today.  A total of 30 minutes was spent counseling and coordinating  the care for this patient.  Greater than 50% of the time was  spent in direct face-to-face contact.  ----------------------------------------------------------------------                   Ma Rings, MD Electronically Signed Final Report   09/22/2023 11:50 am ----------------------------------------------------------------------    [place] MFM OB FOLLOW UP Result Date: 09/11/2023 ----------------------------------------------------------------------  OBSTETRICS REPORT                        (Signed Final 09/11/2023 06:29 pm) ---------------------------------------------------------------------- Patient Info  ID #:       [id number]                          D.O.B.:  1986/05/16 (37 yrs)(F)  Name:       Melissa Fleming                   Visit Date: 09/11/2023 03:27 pm ---------------------------------------------------------------------- Performed By  Attending:        Lin Landsman      Ref. Address:      [address]                    [place]                                                              [place], [place]                                                              [address]  Performed By:     Isabel Caprice        [facility]  Referred By:      [facility] ---------------------------------------------------------------------- Orders  #  Description                           Code        Ordered By  1  [place] MFM UA CORD DOPPLER                76820.02    ERIN LAWRENCE  2  [place] MFM OB FOLLOW UP                   76816.01    Judeth Horn  3  [place] MFM FETAL BPP WO NON               76819.01    ERIN LAWRENCE     STRESS ----------------------------------------------------------------------  #  Order #                     Accession #                Episode #  1  [id number]                   [id number]                 [id number]  2  [id number]                   [id number]                 [id number]  3  [id number]                   [id number]                 [id number]  ---------------------------------------------------------------------- Indications  [redacted] weeks gestation of pregnancy                 Z3A.32  Abnormal chromosomal and genetic finding        O28.5  on antenatal screening of mother (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)  Obesity complicating pregnancy, third           O99.213  trimester  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) ---------------------------------------------------------------------- Fetal Evaluation  Num Of Fetuses:          1  Fetal Heart Rate(bpm):   129  Cardiac Activity:        Observed  Presentation:            Cephalic  Placenta:                Posterior  Amniotic Fluid  AFI FV:      Within normal limits  AFI Sum(cm)     %Tile       Largest Pocket(cm)  11.3            26          5.1  RUQ(cm)       RLQ(cm)       LUQ(cm)        LLQ(cm)  1.6           5.1           2.4            2.2 ---------------------------------------------------------------------- Biophysical Evaluation  Amniotic F.V:   Within normal limits       F. Tone:         Observed  F.  Movement:    Observed                   Score:           8/8  F. Breathing:   Observed ---------------------------------------------------------------------- Biometry  BPD:      76.8  mm     G. Age:  30w 6d         11  %    CI:        76.54   %    70 - 86                                                          FL/HC:       20.5  %    19.1 - 21.3  HC:      278.1  mm     G. Age:  30w 3d        1.1  %    HC/AC:       1.06       0.96 - 1.17  AC:      263.6  mm     G. Age:  30w 3d         11  %    FL/BPD:      74.1  %    71 - 87  FL:       56.9  mm     G. Age:  29w 6d        2.6  %    FL/AC:       21.6  %    20 - 24  Est. FW:    1548   gm     3 lb 7 oz      5  % ---------------------------------------------------------------------- OB History  Blood Type:   O+  Gravidity:    4          SAB:   3  ---------------------------------------------------------------------- Gestational Age  U/S Today:     30w 3d                                        EDD:   11/17/23  Best:          Armida Sans 0d     Det. By:  Marcella Dubs         EDD:   11/06/23                                      (04/30/23) ---------------------------------------------------------------------- Doppler - Fetal Vessels  Umbilical Artery    S/D    %tile      RI    %tile      PI    %tile     PSV    ADFV    RDFV                                                     (cm/s)  3.5       88     0.7       84     1.2       92     28.7      No      No ---------------------------------------------------------------------- Impression  Follow up growth due to obesity in pregnancy  Normal interval growth with measurements consistent with  fetal growth restriction with EFW 5th%  Good fetal movement and amniotic fluid volume  The UA Dopplers are normal without evidence of AEDF or  REDF  Biophysical profile 8/8 ---------------------------------------------------------------------- Recommendations  Continue weekly testing with UAD/BPP  Repeat growth in 3 weeks. ----------------------------------------------------------------------              Lin Landsman, MD Electronically Signed Final Report   09/11/2023 06:29 pm ----------------------------------------------------------------------    [place] MFM UA CORD DOPPLER Result Date: 09/11/2023 ----------------------------------------------------------------------  OBSTETRICS REPORT                        (Signed Final 09/11/2023 06:29 pm) ---------------------------------------------------------------------- Patient Info  ID #:       [id number]                          D.O.B.:  June 03, 1986 (37 yrs)(F)  Name:       Kingsley Plan Stanco                   Visit Date: 09/11/2023 03:27 pm ---------------------------------------------------------------------- Performed By  Attending:        Lin Landsman      Ref. Address:      [address]                    [place]                                                              [place], [place]                                                              [address]  Performed By:     Isabel Caprice        [facility]  Referred By:      [facility] ---------------------------------------------------------------------- Orders  #  Description                           Code        Ordered By  1  [place] MFM  UA CORD DOPPLER                76820.02    Judeth Horn  2  [place] MFM OB FOLLOW UP                   62130.86    Judeth Horn  3  [place] MFM FETAL BPP WO NON               76819.01    ERIN LAWRENCE     STRESS ----------------------------------------------------------------------  #  Order #                     Accession #                Episode #  1  [id number]                   [id number]                 [id number]  2  [id number]                   [id number]                 [id number]  3  [id number]                   [id number]                 [id number] ---------------------------------------------------------------------- Indications  [redacted] weeks gestation of pregnancy                 Z3A.32  Abnormal chromosomal and genetic finding        O28.5  on antenatal screening of mother (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)  Obesity complicating pregnancy, third           O99.213  trimester  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) ---------------------------------------------------------------------- Fetal Evaluation  Num Of Fetuses:          1  Fetal Heart Rate(bpm):   129  Cardiac Activity:        Observed  Presentation:            Cephalic  Placenta:                Posterior  Amniotic Fluid  AFI FV:      Within normal limits  AFI Sum(cm)     %Tile       Largest Pocket(cm)  11.3            26          5.1  RUQ(cm)       RLQ(cm)        LUQ(cm)        LLQ(cm)  1.6           5.1           2.4            2.2 ---------------------------------------------------------------------- Biophysical Evaluation  Amniotic F.V:   Within normal limits       F. Tone:         Observed  F. Movement:    Observed                   Score:           8/8  F. Breathing:   Observed ----------------------------------------------------------------------  Biometry  BPD:      76.8  mm     G. Age:  30w 6d         11  %    CI:        76.54   %    70 - 86                                                          FL/HC:       20.5  %    19.1 - 21.3  HC:      278.1  mm     G. Age:  30w 3d        1.1  %    HC/AC:       1.06       0.96 - 1.17  AC:      263.6  mm     G. Age:  30w 3d         11  %    FL/BPD:      74.1  %    71 - 87  FL:       56.9  mm     G. Age:  29w 6d        2.6  %    FL/AC:       21.6  %    20 - 24  Est. FW:    1548   gm     3 lb 7 oz      5  % ---------------------------------------------------------------------- OB History  Blood Type:   O+  Gravidity:    4          SAB:   3 ---------------------------------------------------------------------- Gestational Age  U/S Today:     30w 3d                                        EDD:   11/17/23  Best:          Armida Sans 0d     Det. By:  Marcella Dubs         EDD:   11/06/23                                      (04/30/23) ---------------------------------------------------------------------- Doppler - Fetal Vessels  Umbilical Artery    S/D    %tile      RI    %tile      PI    %tile     PSV    ADFV    RDFV                                                     (cm/s)    3.5       88     0.7       84     1.2       92     28.7      No  No ---------------------------------------------------------------------- Impression  Follow up growth due to obesity in pregnancy  Normal interval growth with measurements consistent with  fetal growth restriction with EFW 5th%  Good fetal movement and amniotic fluid volume  The UA Dopplers are  normal without evidence of AEDF or  REDF  Biophysical profile 8/8 ---------------------------------------------------------------------- Recommendations  Continue weekly testing with UAD/BPP  Repeat growth in 3 weeks. ----------------------------------------------------------------------              Lin Landsman, MD Electronically Signed Final Report   09/11/2023 06:29 pm ----------------------------------------------------------------------      MAU Course:    Orders Placed This Encounter  Procedures   Urinalysis, Routine w reflex microscopic -Urine, Clean Catch   Protein / creatinine ratio, urine   CBC   Magnesium   Comprehensive metabolic panel   TSH   Brain natriuretic peptide   CBC   Comprehensive metabolic panel   Protein, urine, 24 hour   Creatinine clearance, urine, 24 hour   Diet gestational carb mod Room service appropriate? Yes; Fluid consistency: Thin   Cardiac Monitoring - Continuous Indefinite   Apply Diabetes Mellitus Care Plan   Notify physician (specify)   Notify physician (specify)   Vital signs   Defer vaginal exam for vaginal bleeding or PROM <37 weeks   Apply Antepartum Care Plan   Initiate Oral Care Protocol   Initiate Carrier Fluid Protocol   SCDs   Activity as tolerated   Apply Diabetes Mellitus Care Plan   Notify physician (specify)   If fasting CBG < 80 mg/dL, contact MD for TID AC CBGs.   Full code   EKG 12-Lead   ED EKG   EKG 12-Lead   EKG 12-Lead   ECHOCARDIOGRAM COMPLETE   Type and screen [facility]   Fetal nonstress test   Admit to Inpatient (patient's expected length of stay will be greater than 2 midnights or inpatient only procedure)         Meds ordered this encounter  Medications   acetaminophen-caffeine (EXCEDRIN TENSION HEADACHE) 500-65 MG per tablet 1 tablet   DISCONTD: labetalol (NORMODYNE) tablet 200 mg   labetalol (NORMODYNE) tablet 400 mg   aspirin EC tablet 162 mg   DISCONTD: labetalol (NORMODYNE)  tablet 400 mg   DISCONTD: lactated ringers infusion   acetaminophen (TYLENOL) tablet 650 mg   calcium carbonate (TUMS - dosed in mg elemental calcium) chewable tablet 400 mg of elemental calcium   prenatal multivitamin tablet 1 tablet   sodium chloride flush (NS) 0.9 % injection 3 mL   sodium chloride flush (NS) 0.9 % injection 3 mL   0.9 %  sodium chloride infusion   DISCONTD: betamethasone acetate-betamethasone sodium phosphate (CELESTONE) injection 12 mg   insulin aspart (novoLOG) injection 0-15 Units      CBG < 60::   call physician      CBG < 70::   Implement Hypoglycemia Standing Orders and refer to Hypoglycemia Standing Orders sidebar report      CBG 70 - 90 (dose in units)::   0      CBG 91 - 120 (dose in units)::   0      CBG 121 - 160 (dose in units)::   3      CBG 161 - 200 (dose in units)::   5      CBG 201 - 240 (dose in units)::   7      CBG 241 - 280 (dose in units)::   9  CBG 281 - 320 (dose in units)::   11      CBG 321 - 360 (dose in units)::   13      CBG 361 - 400 (dose in units)::   15      CBG > 400:   17 units and call physician   NIFEdipine (PROCARDIA-XL/NIFEDICAL-XL) 24 hr tablet 30 mg   labetalol (NORMODYNE) tablet 400 mg      MDM: 1715: While assessing patient, BP noted to be 160/101. Obtaining repeat Pre-E labs due to worsening headache, severe range BP x1. EFM Cat 1 with accels.    1845: Patient noted to have HR on pulse ox intermittently dipping into 20s. EKG noted to have regular PVCs every 3rd beat. Reviewed previous EKGs, not noted to have PVCs. Attempted to page cardiology x2 to discuss case. Patient notes improved headache and fetal movement.    1930: Patient placed on continuous telemetry. Severe range blood pressures continue, SpO2 continues to note low pulse -- on auscultation, these episodes coincide with runs of PVC episodes as noted on EKG. Patient noted that she had not taken her labetalol other than this AM. Labetalol ordered. Discussed  with Dr. Vergie Living, possibly not true pre-eclampsia with severe features and more likely inadequately controlled hypertension. Would recommend admit for observation, awaiting recommendations from cardiology. EFM Cat 1. Labs pending: CBC, CMP, TSH, Troponin, Mg.    2000: Discussed case with cardiology: recommended echocardiogram to rule out structural cause of arrhythmia. Can increase pharmacotherapy -- will add nifedipine 30 mg daily. Admitting for observation and further workup. Cardiology to continue to follow.    Assessment: 1. Supervision of high risk pregnancy in third trimester   2. Fetal growth restriction antepartum   3. Chronic hypertension   4. [redacted] weeks gestation of pregnancy   5. Insulin controlled gestational diabetes mellitus (GDM) in third trimester   6. Pregnancy headache in third trimester   7. NST (non-stress test) reactive on fetal surveillance     Plan: Admit for observation with [facility].    Madelyn Brunner, MD 09/24/2023 8:09 PM   Attending Attestation   I saw and evaluated the patient, performing the key elements of the service.I  personally performed or re-performed the history, physical exam, and medical decision making activities of this service and have verified that the service and findings are accurately documented in the resident's note. I developed the management plan that is described in the resident's note, and I agree with the content, with my edits above.    Derrel Nip, MD Attending Family Medicine Physician, [facility] for [facility], [facility]

## 2023-09-24 NOTE — Consult Note (Signed)
Cardiology Consult:   Patient ID: Melissa Fleming; MRN: [id number]; DOB: 03/17/1986   Admission date: 09/24/2023  Primary Care Provider: Patient, No Pcp Per Primary Cardiologist: New to [facility]  Chief Complaint:  PVCs  Patient Profile:   Melissa Fleming is a 37 y.o. female with a history of chronic hypertension persents at 33 weeks and 4 days and is pending   History of Present Illness:   Melissa Fleming a 37 year old with a history of hypertension, is nearing the end of her pregnancy. She initially presented with headaches and elevated blood pressures. During the evaluation, an EKG revealed frequent PVCs, which were symptomatic and associated with palpitations. The PVCs were monomorphic in nature and occurred despite the patient being on a significant dose of labetalol. There was no evidence of non-sustaining ventricular tachycardia.  The patient has not experienced palpitations or arrhythmias in previous pregnancies. She denies any chest pain or shortness of breath. Alongside hypertension, the patient is also dealing with obesity. There is a family history of heart disease, specifically in the patient's grandmother, but no history of sudden cardiac death.  The patient reports palpitations that coincide with the PVCs but denies syncope, chest pain, or shortness of breath. On examination, the patient had regular heartbeats. Other aspects of the physical examination were benign.  Allergies:    Allergies  Allergen Reactions   Morphine Hives    Social History:   Social History   Socioeconomic History   Marital status: Single    Spouse name: Not on file   Number of children: Not on file   Years of education: Not on file   Highest education level: Associate degree: academic program  Occupational History   Not on file  Tobacco Use   Smoking status: Never   Smokeless tobacco: Never  Vaping Use   Vaping status: Never Used  Substance and Sexual Activity   Alcohol use: Not Currently     Comment: occasionally   Drug use: No   Sexual activity: Not Currently    Birth control/protection: None, Other-see comments    Comment: pull out  Other Topics Concern   Not on file  Social History Narrative   Not on file   Social Drivers of Health   Financial Resource Strain: Low Risk  (05/25/2023)   Overall Financial Resource Strain (CARDIA)    Difficulty of Paying Living Expenses: Not very hard  Food Insecurity: Food Insecurity Present (05/25/2023)   Hunger Vital Sign    Worried About Running Out of Food in the Last Year: Sometimes true    Ran Out of Food in the Last Year: Never true  Transportation Needs: No Transportation Needs (05/25/2023)   PRAPARE - Administrator, Civil Service (Medical): No    Lack of Transportation (Non-Medical): No  Physical Activity: Insufficiently Active (05/25/2023)   Exercise Vital Sign    Days of Exercise per Week: 3 days    Minutes of Exercise per Session: 30 min  Stress: No Stress Concern Present (05/25/2023)   [employer] of Occupational Health - Occupational Stress Questionnaire    Feeling of Stress : Only a little  Recent Concern: Stress - Stress Concern Present (04/30/2023)   [employer] of Occupational Health - Occupational Stress Questionnaire    Feeling of Stress : To some extent  Social Connections: Moderately Integrated (05/25/2023)   Social Connection and Isolation Panel [NHANES]    Frequency of Communication with Friends and Family: More than three times a week  Frequency of Social Gatherings with Friends and Family: More than three times a week    Attends Religious Services: More than 4 times per year    Active Member of [employer] or Organizations: No    Attends Banker Meetings: Never    Marital Status: Living with partner  Intimate Partner Violence: Not At Risk (07/04/2023)   Received from [facility], Afraid, Rape, and Kick questionnaire    Fear of Current or Ex-Partner: No     Emotionally Abused: No    Physically Abused: No    Sexually Abused: No    Family History:   The patient's family history includes Cancer in her maternal grandfather and maternal grandmother; Diabetes in her mother and paternal grandmother; Hypertension in her maternal grandmother and mother; Mental illness in her sister.    ROS:  Please see the history of present illness.   Physical Exam/Data:   Vitals:   09/24/23 1945 09/24/23 1951 09/24/23 2000 09/24/23 2030  BP: (!) 150/101 (!) 150/101 (!) 160/97 (!) 147/96  Pulse: 78 85 75 78  Resp:      Temp:      TempSrc:      SpO2:  100% 100% 100%  Weight:      Height:       No intake or output data in the 24 hours ending 09/24/23 2059 Filed Weights   09/24/23 1646  Weight: 91 kg   Body mass index is 39.2 kg/m.   Gen: no distress, Neck: No JVD Cardiac: No Rubs or Gallops, No murmur, IRIR rhythm +2 radial pulses Respiratory: Clear to auscultation bilaterally, normal effort, normal  respiratory rate GI: Soft, nontender, non-distended  MS: No  edema;  moves all extremities Integument: Skin feels warm Neuro:  At time of evaluation, alert and oriented to person/place/time/situation  Psych: Normal affect, patient feels warm  EKG:  The ECG that was done  was personally reviewed and demonstrates SR with frequent monomorphic outflow tract PVCs  Telemetry: Sinus rhythm with frequent monomorphic PVCs without non-sustained ventricular tachycardia or couplets  Laboratory Data:  Chemistry Recent Labs  Lab 09/22/23 1326 09/24/23 1854  NA 135 136  K 3.8 4.5  CL 105 104  CO2 21* 20*  GLUCOSE 55* 53*  BUN 7 9  CREATININE 0.74 0.63  CALCIUM 9.2 9.3  GFRNONAA >60 >60  ANIONGAP 9 12    Recent Labs  Lab 09/22/23 1326 09/24/23 1854  PROT 7.0 6.5  ALBUMIN 3.0* 2.8*  AST 16 14*  ALT 12 11  ALKPHOS 92 92  BILITOT 0.7 0.5   Hematology Recent Labs  Lab 09/22/23 1454 09/24/23 1854  WBC 7.4 7.4  RBC 3.99 4.15  HGB 11.8* 12.1   HCT 34.5* 36.6  MCV 86.5 88.2  MCH 29.6 29.2  MCHC 34.2 33.1  RDW 13.2 13.2  PLT 347 347     Assessment and Plan:   Premature Ventricular Contractions (PVCs) Frequent monomorphic PVCs with palpitations. No non-sustaining ventricular tachycardia observed. Persistent PVCs despite significant labetalol use. No history of arrhythmias or palpitations in previous pregnancies. No chest pain, shortness of breath, or syncope. Telemetry shows sinus rhythm with frequent PVCs. No family history of sudden cardiac death. Risks of antiarrhythmic drugs discussed, including potential side effects. Decision to avoid antiarrhythmic drugs due to side effect profiles and pregnancy.  Outflow tract PVC are often well tolerated. - Order echocardiogram - Check magnesium levels and maintain goal of 2 - Maintain potassium goal of 4 -  Continue AV nodal agents - Telemetry monitoring overnight - Discharge with a one-week heart monitor if appropriate - Follow up with women's cardiology at discharge  Pregnancy with related sequelae - Chronic HTN (SBP 140+ or DBP 90+) before 20 weeks - CARPREG II Score of 1-2 - she is greater thatn 28 weeks, defer ASA start  - continue BB; discussed with OB/GYN team to start CCB  Hypertension - Elevated blood pressures noted. Currently on labetalol. CCB start as above   For questions or updates, please contact [facility] Please consult [email or web address] for contact info under Cardiology/STEMI.   Riley Lam, MD FASE [facility] Cardiologist [facility]  [address] [place], [address] [place], [place] [address] [phone]  8:59 PM

## 2023-09-24 NOTE — MAU Provider Note (Addendum)
Chief Complaint:  Decreased Fetal Movement, dizziness, and Pressure in chest   Event Date/Time   First Provider Initiated Contact with Patient 09/24/23 1709     HPI: Melissa Fleming is a 37 y.o. [id number] at [redacted]w[redacted]d who presents to maternity admissions reporting decreased fetal movement and overall "feeling different". She was seen in MAU for similar, was discharged with increased BP meds with symptoms improved with food. Since then she feels like she has been not feeling like herself with worsening headache, chest heaviness, and chest palpitations. Has been taking acetaminophen for headaches with no relief. She has also been worried that baby has not been moving as much. Fetal movements are still present but less than usual.   Denies contractions, leakage of fluid or vaginal bleeding.   Pregnancy Course:   Past Medical History:  Diagnosis Date   Hypertension    PCOS (polycystic ovarian syndrome)    Prediabetes    OB History  Gravida Para Term Preterm AB Living  5 1  1 3    SAB IAB Ectopic Multiple Live Births  3    0    # Outcome Date GA Lbr Len/2nd Weight Sex Type Anes PTL Lv  5 Current           4 SAB 12/14/21          3 Preterm           2 SAB           1 SAB         FD     Birth Comments: 5 months    Obstetric Comments  -2nd miscarriage- she was around 5mos.  Had delivery at hospital, did not know she was pregnant   Past Surgical History:  Procedure Laterality Date   WISDOM TOOTH EXTRACTION     Family History  Problem Relation Age of Onset   Diabetes Mother    Hypertension Mother    Mental illness Sister    Cancer Maternal Grandmother    Hypertension Maternal Grandmother    Cancer Maternal Grandfather        Bone   Diabetes Paternal Grandmother    Social History   Tobacco Use   Smoking status: Never   Smokeless tobacco: Never  Vaping Use   Vaping status: Never Used  Substance Use Topics   Alcohol use: Not Currently    Comment: occasionally   Drug use: No    Allergies  Allergen Reactions   Morphine Hives   Facility-Administered Medications Prior to Admission  Medication Dose Route Frequency Provider Last Rate Last Admin   triamcinolone acetonide (KENALOG) 10 MG/ML injection 20 mg  20 mg Intra-articular Once        triamcinolone acetonide (KENALOG) 10 MG/ML injection 20 mg  20 mg Intra-articular Once        Medications Prior to Admission  Medication Sig Dispense Refill Last Dose/Taking   aspirin EC 81 MG tablet Take 2 tablets (162 mg total) by mouth daily. 60 tablet 6 09/24/2023   glyBURIDE (DIABETA) 5 MG tablet Take 1 tablet (5 mg total) by mouth daily with breakfast. 30 tablet 2 09/24/2023   labetalol (NORMODYNE) 200 MG tablet Take 2 tablets (400 mg total) by mouth 3 (three) times daily. (Patient taking differently: Take 200 mg by mouth 3 (three) times daily.) 180 tablet 3 09/24/2023   Accu-Chek Softclix Lancets lancets Use as instructed QID 100 each 12    Blood Glucose Monitoring Suppl (ACCU-CHEK GUIDE) w/Device KIT USE AS DIRECTED  ONCE DAILY TO CHECK BLOOD SUGAR      glucose blood (ACCU-CHEK GUIDE) test strip Use as instructed QID 100 each 12     I have reviewed patient's Past Medical Hx, Surgical Hx, Family Hx, Social Hx, medications and allergies.   ROS:  Review of Systems  Eyes:  Negative for photophobia.  Respiratory:  Negative for cough and shortness of breath.   Cardiovascular:  Positive for chest pain and palpitations.  Gastrointestinal:  Positive for nausea. Negative for abdominal pain.  Genitourinary:  Negative for vaginal bleeding and vaginal discharge.  Neurological:  Positive for weakness, light-headedness and headaches. Negative for numbness.    Physical Exam  Patient Vitals for the past 24 hrs:  BP Temp Temp src Pulse Resp SpO2 Height Weight  09/24/23 1951 (!) 150/101 -- -- 85 -- 100 % -- --  09/24/23 1945 (!) 150/101 -- -- 75 -- -- -- --  09/24/23 1940 -- -- -- -- -- 99 % -- --  09/24/23 1935 -- -- -- -- -- 99 %  -- --  09/24/23 1931 (!) 163/89 -- -- 86 -- -- -- --  09/24/23 1930 -- -- -- -- -- 99 % -- --  09/24/23 1925 -- -- -- -- -- 100 % -- --  09/24/23 1920 -- -- -- -- -- 100 % -- --  09/24/23 1916 -- -- -- 86 -- -- -- --  09/24/23 1915 -- -- -- -- -- 100 % -- --  09/24/23 1910 -- -- -- -- -- 100 % -- --  09/24/23 1905 -- -- -- -- -- 100 % -- --  09/24/23 1901 (!) 158/106 -- -- 84 -- -- -- --  09/24/23 1900 -- -- -- -- -- 100 % -- --  09/24/23 1846 (!) 155/91 -- -- 83 -- -- -- --  09/24/23 1831 (!) 159/102 -- -- 83 -- -- -- --  09/24/23 1830 -- -- -- -- -- 92 % -- --  09/24/23 1820 -- -- -- -- -- 99 % -- --  09/24/23 1816 (!) 149/98 -- -- 81 -- -- -- --  09/24/23 1815 -- -- -- -- -- 100 % -- --  09/24/23 1801 (!) 139/95 -- -- 82 -- -- -- --  09/24/23 1800 -- -- -- -- -- 100 % -- --  09/24/23 1755 -- -- -- -- -- 99 % -- --  09/24/23 1750 -- -- -- -- -- 100 % -- --  09/24/23 1746 (!) 154/105 -- -- 78 -- -- -- --  09/24/23 1745 -- -- -- -- -- 99 % -- --  09/24/23 1740 -- -- -- -- -- 100 % -- --  09/24/23 1735 -- -- -- -- -- 100 % -- --  09/24/23 1730 (!) 147/101 -- -- 83 -- 99 % -- --  09/24/23 1725 -- -- -- -- -- 100 % -- --  09/24/23 1720 -- -- -- -- -- 100 % -- --  09/24/23 1716 (!) 160/101 -- -- 76 -- -- -- --  09/24/23 1715 -- -- -- -- -- 100 % -- --  09/24/23 1710 -- -- -- -- -- 100 % -- --  09/24/23 1705 -- -- -- -- -- 99 % -- --  09/24/23 1700 (!) 132/95 -- -- 83 -- 100 % -- --  09/24/23 1655 -- -- -- -- -- 100 % -- --  09/24/23 1650 -- -- -- -- -- 100 % -- --  09/24/23 1646 (!) 150/97 98.5 F (36.9 C)  Oral 81 16 100 % 5' (1.524 m) 91 kg   Constitutional: Well-developed, well-nourished female in no acute distress.  Cardiovascular: Irregular rate and rhythm, normal S1/S2, no m/r/g. Pulses irregular.  Respiratory: normal effort GI: Abd soft, non-tender, gravid appropriate for gestational age. Pos BS x 4 MS: Extremities nontender, no edema, normal ROM Neurologic: Alert and  oriented x 4.  GU: Neg CVAT.  Pelvic: NEFG, physiologic discharge, no blood, cervix clean. No CMT     FHT:  Baseline 130s, moderate variability, accelerations present, no decelerations Contractions: none   Labs: Results for orders placed or performed during the hospital encounter of 09/24/23 (from the past 24 hours)  Urinalysis, Routine w reflex microscopic -Urine, Clean Catch     Status: Abnormal   Collection Time: 09/24/23  6:10 PM  Result Value Ref Range   Color, Urine STRAW (A) YELLOW   APPearance CLEAR CLEAR   Specific Gravity, Urine 1.003 (L) 1.005 - 1.030   pH 6.0 5.0 - 8.0   Glucose, UA NEGATIVE NEGATIVE mg/dL   Hgb urine dipstick NEGATIVE NEGATIVE   Bilirubin Urine NEGATIVE NEGATIVE   Ketones, ur NEGATIVE NEGATIVE mg/dL   Protein, ur NEGATIVE NEGATIVE mg/dL   Nitrite NEGATIVE NEGATIVE   Leukocytes,Ua SMALL (A) NEGATIVE   RBC / HPF 0-5 0 - 5 RBC/hpf   WBC, UA 0-5 0 - 5 WBC/hpf   Bacteria, UA RARE (A) NONE SEEN   Squamous Epithelial / HPF 0-5 0 - 5 /HPF  Protein / creatinine ratio, urine     Status: None   Collection Time: 09/24/23  6:10 PM  Result Value Ref Range   Creatinine, Urine 22 mg/dL   Total Protein, Urine <6 mg/dL   Protein Creatinine Ratio        0.00 - 0.15 mg/mg[Cre]  CBC     Status: None   Collection Time: 09/24/23  6:54 PM  Result Value Ref Range   WBC 7.4 4.0 - 10.5 K/uL   RBC 4.15 3.87 - 5.11 MIL/uL   Hemoglobin 12.1 12.0 - 15.0 g/dL   HCT 65.7 84.6 - 96.2 %   MCV 88.2 80.0 - 100.0 fL   MCH 29.2 26.0 - 34.0 pg   MCHC 33.1 30.0 - 36.0 g/dL   RDW 95.2 84.1 - 32.4 %   Platelets 347 150 - 400 K/uL   nRBC 0.0 0.0 - 0.2 %    Imaging:  [place] MFM UA CORD DOPPLER Result Date: 09/22/2023 ----------------------------------------------------------------------  OBSTETRICS REPORT                       (Signed Final 09/22/2023 11:50 am) ---------------------------------------------------------------------- Patient Info  ID #:       [id number]                           D.O.B.:  1986-05-26 (37 yrs)(F)  Name:       Mellody Dance                   Visit Date: 09/22/2023 10:29 am ---------------------------------------------------------------------- Performed By  Attending:        Ma Rings MD         Ref. Address:     [address]  [place], [place]                                                             [address]  Performed By:     Dennis Bast RDMS      [facility]  Referred By:      [facility] ---------------------------------------------------------------------- Orders  #  Description                           Code        Ordered By  1  [place] MFM UA CORD DOPPLER                76820.02    Braxton Feathers  2  [place] MFM FETAL BPP                      60454.0     [facility]     W/NONSTRESS ----------------------------------------------------------------------  #  Order #                     Accession #                Episode #  1  [id number]                   [id number]                 [id number]  2  [id number]                   [id number]                 [id number] ---------------------------------------------------------------------- Indications  Abnormal chromosomal and genetic finding       O28.5  on antenatal screening of mother (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 on Intergrated 2 and LR NIPS on  Panorama)  Obesity complicating pregnancy, third          O99.213  trimester (BMI 38)  Poor obstetric history-Recurrent (  habitual)    O26.20  abortion (3 consecutive ab's)  Encounter for other antenatal screening        Z36.2  follow-up  Neg Horizon  [redacted] weeks gestation of pregnancy                Z3A.33 ---------------------------------------------------------------------- Vital  Signs  BP:          127/86 ---------------------------------------------------------------------- Fetal Evaluation  Num Of Fetuses:         1  Fetal Heart Rate(bpm):  130  Cardiac Activity:       Observed  Presentation:           Cephalic  Placenta:               Posterior Fundal  P. Cord Insertion:      Previously seen  Amniotic Fluid  AFI FV:      Within normal limits  AFI Sum(cm)     %Tile       Largest Pocket(cm)  12.18           35          3.74  RUQ(cm)       RLQ(cm)       LUQ(cm)        LLQ(cm)  3.37          2.42          2.65           3.74 ---------------------------------------------------------------------- Biophysical Evaluation  Amniotic F.V:   Within normal limits       F. Tone:        Not Observed  F. Movement:    Observed                   N.S.T:          Reactive  F. Breathing:   Not Observed               Score:          6/10 ---------------------------------------------------------------------- OB History  Blood Type:   O+  Gravidity:    4          SAB:   3 ---------------------------------------------------------------------- Gestational Age  Best:          33w 4d     Det. By:  Marcella Dubs         EDD:   11/06/23                                      (04/30/23) ---------------------------------------------------------------------- Anatomy  Cranium:               Previously seen        Aortic Arch:            Previously seen  Cavum:                 Previously seen        Ductal Arch:            Previously seen  Ventricles:            Appears normal         Diaphragm:              Appears normal  Choroid Plexus:        Previously seen        Stomach:  Appears normal, left                                                                        sided  Cerebellum:            Previously seen        Abdomen:                Previously seen  Posterior Fossa:       Previously seen        Abdominal Wall:         Previously seen  Face:                  Orbits and profile     Cord Vessels:            Previously seen                         previously seen  Lips:                  Previously seen        Kidneys:                Appear normal  Thoracic:              Previously seen        Bladder:                Appears normal  Heart:                 Appears normal         Spine:                  Previously seen                         (4CH, axis, and                         situs)  RVOT:                  Previously seen        Upper Extremities:      Previously seen  LVOT:                  Previously seen        Lower Extremities:      Previously seen ---------------------------------------------------------------------- Doppler - Fetal Vessels  Umbilical Artery   S/D     %tile      RI    %tile                      PSV    ADFV    RDFV                                                     (cm/s)   5.97   > 97.5    0.83   > 97.5  28.04      No      No ---------------------------------------------------------------------- Cervix Uterus Adnexa  Cervix  Not visualized (advanced GA >24wks)  Uterus  No abnormality visualized.  Right Ovary  Not visualized.  Left Ovary  Not visualized.  Cul De Sac  No free fluid seen.  Adnexa  No abnormality visualized ---------------------------------------------------------------------- Comments  Kimmi Braunschweig is currently at 33 weeks and 4 days.  She has  been followed due to an IUGR fetus with elevated umbilical  artery Doppler studies.  Her pregnancy has also been complicated by advanced  maternal age, gestational diabetes treated with glyburide, and  chronic hypertension treated with labetalol.  She had an Integrated-2 test which indicated an increased  risk for Down syndrome (due to abnormal serum proteins).  However, her cell free DNA test indicated a low risk for Down  syndrome.  Her blood pressures today were 141/95 and 127/86.  She  denies any signs or symptoms of preeclampsia.  A BPP performed today was 6/10 with a reactive NST.  She  received a -2 for  absent fetal tone and another -2 for absent  fetal breathing movements.  The total AFI was 12.18 cm (within normal limits).  Doppler studies of the umbilical arteries continues to show an  elevated S/D ratio of 5.97.  There were no signs of absent or  reversed end-diastolic flow noted today.  Due to the BPP of 6 out of 10 and her elevated blood  pressures, the patient was sent to the MAU following today's  ultrasound exam for prolonged monitoring and to be worked  up for preeclampsia.  The patient was advised that due to IUGR, the abnormal  serum proteins noted on her Integrated-2 test, gestational  diabetes, and chronic hypertension, she is at risk for  developing preeclampsia.  In the MAU, she should have PIH labs drawn and a P/C ratio  performed to ensure that she has not developed  preeclampsia.  Should she be diagnosed with preeclampsia, she should be  given a course of antenatal corticosteroids.  Should a course  of steroids to be administered, admission to the hospital  should be considered to ensure normal glycemic control.  Should her fetal status be reassuring, her PIH labs are  normal, and she has ruled out for preeclampsia, she will  return to our office in 3 days for another NST.  The patient understands that delivery is the only treatment for  preeclampsia.  She was advised to continue to monitor for  signs and symptoms of preeclampsia.  The patient stated that all of her questions were answered  today.  A total of 30 minutes was spent counseling and coordinating  the care for this patient.  Greater than 50% of the time was  spent in direct face-to-face contact. ----------------------------------------------------------------------                   Ma Rings, MD Electronically Signed Final Report   09/22/2023 11:50 am ----------------------------------------------------------------------   [place] MFM OB FOLLOW UP Result Date:  09/11/2023 ----------------------------------------------------------------------  OBSTETRICS REPORT                        (Signed Final 09/11/2023 06:29 pm) ---------------------------------------------------------------------- Patient Info  ID #:       [id number]                          D.O.B.:  Aug 01, 1986 (37 yrs)(F)  Name:       [facility]                   Visit Date: 09/11/2023 03:27 pm ---------------------------------------------------------------------- Performed By  Attending:        Lin Landsman      Ref. Address:      [address]                    [place]                                                              [place], [place]                                                              [address]  Performed By:     Isabel Caprice        [facility]  Referred By:      [facility] ---------------------------------------------------------------------- Orders  #  Description                           Code        Ordered By  1  [place] MFM UA CORD DOPPLER                76820.02    Judeth Horn  2  [place] MFM OB FOLLOW UP                   [id number]    Judeth Horn  3  [place] MFM FETAL BPP WO NON               76819.01    ERIN LAWRENCE     STRESS ----------------------------------------------------------------------  #  Order #                     Accession #                Episode #  1  [id number]                   [id number]                 [id number]  2  [id number]                   [id number]                 [id number]  3  [id number]  [id number]                 [id number] ---------------------------------------------------------------------- Indications  [redacted] weeks gestation of pregnancy                 Z3A.32  Abnormal chromosomal and genetic finding        O28.5  on antenatal screening of mother (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)  Obesity  complicating pregnancy, third           O99.213  trimester  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) ---------------------------------------------------------------------- Fetal Evaluation  Num Of Fetuses:          1  Fetal Heart Rate(bpm):   129  Cardiac Activity:        Observed  Presentation:            Cephalic  Placenta:                Posterior  Amniotic Fluid  AFI FV:      Within normal limits  AFI Sum(cm)     %Tile       Largest Pocket(cm)  11.3            26          5.1  RUQ(cm)       RLQ(cm)       LUQ(cm)        LLQ(cm)  1.6           5.1           2.4            2.2 ---------------------------------------------------------------------- Biophysical Evaluation  Amniotic F.V:   Within normal limits       F. Tone:         Observed  F. Movement:    Observed                   Score:           8/8  F. Breathing:   Observed ---------------------------------------------------------------------- Biometry  BPD:      76.8  mm     G. Age:  30w 6d         11  %    CI:        76.54   %    70 - 86                                                          FL/HC:       20.5  %    19.1 - 21.3  HC:      278.1  mm     G. Age:  30w 3d        1.1  %    HC/AC:       1.06       0.96 - 1.17  AC:      263.6  mm     G. Age:  30w 3d         11  %    FL/BPD:      74.1  %    71 - 87  FL:       56.9  mm  G. Age:  29w 6d        2.6  %    FL/AC:       21.6  %    20 - 24  Est. FW:    1548   gm     3 lb 7 oz      5  % ---------------------------------------------------------------------- OB History  Blood Type:   O+  Gravidity:    4          SAB:   3 ---------------------------------------------------------------------- Gestational Age  U/S Today:     30w 3d                                        EDD:   11/17/23  Best:          Armida Sans 0d     Det. ByMarcella Dubs         EDD:   11/06/23                                      (04/30/23)  ---------------------------------------------------------------------- Doppler - Fetal Vessels  Umbilical Artery    S/D    %tile      RI    %tile      PI    %tile     PSV    ADFV    RDFV                                                     (cm/s)    3.5       88     0.7       84     1.2       92     28.7      No      No ---------------------------------------------------------------------- Impression  Follow up growth due to obesity in pregnancy  Normal interval growth with measurements consistent with  fetal growth restriction with EFW 5th%  Good fetal movement and amniotic fluid volume  The UA Dopplers are normal without evidence of AEDF or  REDF  Biophysical profile 8/8 ---------------------------------------------------------------------- Recommendations  Continue weekly testing with UAD/BPP  Repeat growth in 3 weeks. ----------------------------------------------------------------------              Lin Landsman, MD Electronically Signed Final Report   09/11/2023 06:29 pm ----------------------------------------------------------------------   [place] MFM UA CORD DOPPLER Result Date: 09/11/2023 ----------------------------------------------------------------------  OBSTETRICS REPORT                        (Signed Final 09/11/2023 06:29 pm) ---------------------------------------------------------------------- Patient Info  ID #:       [id number]                          D.O.B.:  1986-03-18 (37 yrs)(F)  Name:       Kingsley Plan Heffler                   Visit Date: 09/11/2023 03:27 pm ---------------------------------------------------------------------- Performed By  Attending:        Lin Landsman      Ref.  Address:      [address]                    [place]                                                              [place], [place]                                                              [address]  Performed By:     Isabel Caprice        [facility]  Referred By:      [facility] ---------------------------------------------------------------------- Orders  #  Description                           Code        Ordered By  1  [place] MFM UA CORD DOPPLER                76820.02    Judeth Horn  2  [place] MFM OB FOLLOW UP                   [id number]    Judeth Horn  3  [place] MFM FETAL BPP WO NON               76819.01    ERIN LAWRENCE     STRESS ----------------------------------------------------------------------  #  Order #                     Accession #                Episode #  1  [id number]                   [id number]                 [id number]  2  [id number]                   [id number]                 [id number]  3  [id number]                   [id number]                 [id number] ---------------------------------------------------------------------- Indications  [redacted] weeks gestation of pregnancy                 Z3A.32  Abnormal chromosomal and genetic finding        O28.5  on antenatal screening of mother (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)  Obesity complicating pregnancy, third           O99.213  trimester  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) ---------------------------------------------------------------------- Fetal Evaluation  Num Of Fetuses:          1  Fetal Heart Rate(bpm):   129  Cardiac Activity:        Observed  Presentation:            Cephalic  Placenta:                Posterior  Amniotic Fluid  AFI FV:      Within normal limits  AFI Sum(cm)     %Tile       Largest Pocket(cm)  11.3            26          5.1  RUQ(cm)       RLQ(cm)       LUQ(cm)        LLQ(cm)  1.6           5.1           2.4            2.2 ---------------------------------------------------------------------- Biophysical Evaluation  Amniotic F.V:   Within normal limits       F. Tone:         Observed  F. Movement:    Observed                   Score:            8/8  F. Breathing:   Observed ---------------------------------------------------------------------- Biometry  BPD:      76.8  mm     G. Age:  30w 6d         11  %    CI:        76.54   %    70 - 86                                                          FL/HC:       20.5  %    19.1 - 21.3  HC:      278.1  mm     G. Age:  30w 3d        1.1  %    HC/AC:       1.06       0.96 - 1.17  AC:      263.6  mm     G. Age:  30w 3d         11  %    FL/BPD:      74.1  %    71 - 87  FL:       56.9  mm     G. Age:  29w 6d        2.6  %    FL/AC:       21.6  %    20 - 24  Est. FW:    1548   gm  3 lb 7 oz      5  % ---------------------------------------------------------------------- OB History  Blood Type:   O+  Gravidity:    4          SAB:   3 ---------------------------------------------------------------------- Gestational Age  U/S Today:     30w 3d                                        EDD:   11/17/23  Best:          Armida Sans 0d     Det. ByMarcella Dubs         EDD:   11/06/23                                      (04/30/23) ---------------------------------------------------------------------- Doppler - Fetal Vessels  Umbilical Artery    S/D    %tile      RI    %tile      PI    %tile     PSV    ADFV    RDFV                                                     (cm/s)    3.5       88     0.7       84     1.2       92     28.7      No      No ---------------------------------------------------------------------- Impression  Follow up growth due to obesity in pregnancy  Normal interval growth with measurements consistent with  fetal growth restriction with EFW 5th%  Good fetal movement and amniotic fluid volume  The UA Dopplers are normal without evidence of AEDF or  REDF  Biophysical profile 8/8 ---------------------------------------------------------------------- Recommendations  Continue weekly testing with UAD/BPP  Repeat growth in 3 weeks. ----------------------------------------------------------------------               Lin Landsman, MD Electronically Signed Final Report   09/11/2023 06:29 pm ----------------------------------------------------------------------    MAU Course: Orders Placed This Encounter  Procedures   Urinalysis, Routine w reflex microscopic -Urine, Clean Catch   Protein / creatinine ratio, urine   CBC   Magnesium   Comprehensive metabolic panel   TSH   Brain natriuretic peptide   CBC   Comprehensive metabolic panel   Protein, urine, 24 hour   Creatinine clearance, urine, 24 hour   Diet gestational carb mod Room service appropriate? Yes; Fluid consistency: Thin   Cardiac Monitoring - Continuous Indefinite   Apply Diabetes Mellitus Care Plan   Notify physician (specify)   Notify physician (specify)   Vital signs   Defer vaginal exam for vaginal bleeding or PROM <37 weeks   Apply Antepartum Care Plan   Initiate Oral Care Protocol   Initiate Carrier Fluid Protocol   SCDs   Activity as tolerated   Apply Diabetes Mellitus Care Plan   Notify physician (specify)   If fasting CBG < 80 mg/dL, contact MD for TID AC CBGs.   Full code   EKG 12-Lead   ED EKG  EKG 12-Lead   EKG 12-Lead   ECHOCARDIOGRAM COMPLETE   Type and screen [facility]   Fetal nonstress test   Admit to Inpatient (patient's expected length of stay will be greater than 2 midnights or inpatient only procedure)   Meds ordered this encounter  Medications   acetaminophen-caffeine (EXCEDRIN TENSION HEADACHE) 500-65 MG per tablet 1 tablet   DISCONTD: labetalol (NORMODYNE) tablet 200 mg   labetalol (NORMODYNE) tablet 400 mg   aspirin EC tablet 162 mg   DISCONTD: labetalol (NORMODYNE) tablet 400 mg   DISCONTD: lactated ringers infusion   acetaminophen (TYLENOL) tablet 650 mg   calcium carbonate (TUMS - dosed in mg elemental calcium) chewable tablet 400 mg of elemental calcium   prenatal multivitamin tablet 1 tablet   sodium chloride flush (NS) 0.9 % injection 3 mL   sodium chloride  flush (NS) 0.9 % injection 3 mL   0.9 %  sodium chloride infusion   DISCONTD: betamethasone acetate-betamethasone sodium phosphate (CELESTONE) injection 12 mg   insulin aspart (novoLOG) injection 0-15 Units    CBG < 60::   call physician    CBG < 70::   Implement Hypoglycemia Standing Orders and refer to Hypoglycemia Standing Orders sidebar report    CBG 70 - 90 (dose in units)::   0    CBG 91 - 120 (dose in units)::   0    CBG 121 - 160 (dose in units)::   3    CBG 161 - 200 (dose in units)::   5    CBG 201 - 240 (dose in units)::   7    CBG 241 - 280 (dose in units)::   9    CBG 281 - 320 (dose in units)::   11    CBG 321 - 360 (dose in units)::   13    CBG 361 - 400 (dose in units)::   15    CBG > 400:   17 units and call physician   NIFEdipine (PROCARDIA-XL/NIFEDICAL-XL) 24 hr tablet 30 mg   labetalol (NORMODYNE) tablet 400 mg    MDM: 1715: While assessing patient, BP noted to be 160/101. Obtaining repeat Pre-E labs due to worsening headache, severe range BP x1. EFM Cat 1 with accels.   1845: Patient noted to have HR on pulse ox intermittently dipping into 20s. EKG noted to have regular PVCs every 3rd beat. Reviewed previous EKGs, not noted to have PVCs. Attempted to page cardiology x2 to discuss case. Patient notes improved headache and fetal movement.   1930: Patient placed on continuous telemetry. Severe range blood pressures continue, SpO2 continues to note low pulse -- on auscultation, these episodes coincide with runs of PVC episodes as noted on EKG. Patient noted that she had not taken her labetalol other than this AM. Labetalol ordered. Discussed with Dr. Vergie Living, possibly not true pre-eclampsia with severe features and more likely inadequately controlled hypertension. Would recommend admit for observation, awaiting recommendations from cardiology. EFM Cat 1. Labs pending: CBC, CMP, TSH, Troponin, Mg.   2000: Discussed case with cardiology: recommended echocardiogram to rule  out structural cause of arrhythmia. Can increase pharmacotherapy -- will add nifedipine 30 mg daily. Admitting for observation and further workup. Cardiology to continue to follow.   Assessment: 1. Supervision of high risk pregnancy in third trimester   2. Fetal growth restriction antepartum   3. Chronic hypertension   4. [redacted] weeks gestation of pregnancy   5. Insulin controlled gestational diabetes mellitus (GDM) in  third trimester   6. Pregnancy headache in third trimester   7. NST (non-stress test) reactive on fetal surveillance    Plan: Admit for observation with [facility].   Madelyn Brunner, MD 09/24/2023 8:09 PM  Attending Attestation  I saw and evaluated the patient, performing the key elements of the service.I  personally performed or re-performed the history, physical exam, and medical decision making activities of this service and have verified that the service and findings are accurately documented in the resident's note. I developed the management plan that is described in the resident's note, and I agree with the content, with my edits above.    Derrel Nip, MD Attending Family Medicine Physician, [facility] for [facility], [facility]

## 2023-09-25 ENCOUNTER — Ambulatory Visit: Payer: Medicaid Other

## 2023-09-25 ENCOUNTER — Other Ambulatory Visit: Payer: Medicaid Other

## 2023-09-25 ENCOUNTER — Ambulatory Visit: Payer: Medicaid Other | Attending: Maternal & Fetal Medicine

## 2023-09-25 ENCOUNTER — Encounter (HOSPITAL_COMMUNITY): Payer: Self-pay | Admitting: Obstetrics and Gynecology

## 2023-09-25 ENCOUNTER — Telehealth: Payer: Self-pay

## 2023-09-25 ENCOUNTER — Other Ambulatory Visit: Payer: Self-pay

## 2023-09-25 ENCOUNTER — Inpatient Hospital Stay (HOSPITAL_COMMUNITY): Payer: Medicaid Other

## 2023-09-25 DIAGNOSIS — O10913 Unspecified pre-existing hypertension complicating pregnancy, third trimester: Secondary | ICD-10-CM

## 2023-09-25 DIAGNOSIS — R002 Palpitations: Secondary | ICD-10-CM

## 2023-09-25 DIAGNOSIS — Z3A34 34 weeks gestation of pregnancy: Secondary | ICD-10-CM | POA: Diagnosis not present

## 2023-09-25 DIAGNOSIS — I493 Ventricular premature depolarization: Secondary | ICD-10-CM

## 2023-09-25 DIAGNOSIS — R94110 Abnormal electro-oculogram [EOG]: Secondary | ICD-10-CM

## 2023-09-25 DIAGNOSIS — O10919 Unspecified pre-existing hypertension complicating pregnancy, unspecified trimester: Secondary | ICD-10-CM

## 2023-09-25 LAB — COMPREHENSIVE METABOLIC PANEL
ALT: 11 U/L (ref 0–44)
AST: 14 U/L — ABNORMAL LOW (ref 15–41)
Albumin: 2.6 g/dL — ABNORMAL LOW (ref 3.5–5.0)
Alkaline Phosphatase: 77 U/L (ref 38–126)
Anion gap: 9 (ref 5–15)
BUN: 10 mg/dL (ref 6–20)
CO2: 19 mmol/L — ABNORMAL LOW (ref 22–32)
Calcium: 9.1 mg/dL (ref 8.9–10.3)
Chloride: 107 mmol/L (ref 98–111)
Creatinine, Ser: 0.69 mg/dL (ref 0.44–1.00)
GFR, Estimated: >60 mL/min (ref >60)
Glucose, Bld: 78 mg/dL (ref 70–99)
Potassium: 4 mmol/L (ref 3.5–5.1)
Sodium: 135 mmol/L (ref 135–145)
Total Bilirubin: 0.4 mg/dL (ref <1.2)
Total Protein: 6.2 g/dL — ABNORMAL LOW (ref 6.5–8.1)

## 2023-09-25 LAB — CBC
HCT: 35.3 % — ABNORMAL LOW (ref 36.0–46.0)
Hemoglobin: 11.9 g/dL — ABNORMAL LOW (ref 12.0–15.0)
MCH: 29.5 pg (ref 26.0–34.0)
MCHC: 33.7 g/dL (ref 30.0–36.0)
MCV: 87.6 fL (ref 80.0–100.0)
Platelets: 368 10*3/uL (ref 150–400)
RBC: 4.03 MIL/uL (ref 3.87–5.11)
RDW: 13.2 % (ref 11.5–15.5)
WBC: 9.2 10*3/uL (ref 4.0–10.5)
nRBC: 0 % (ref 0.0–0.2)

## 2023-09-25 LAB — MAGNESIUM: Magnesium: 1.8 mg/dL (ref 1.7–2.4)

## 2023-09-25 LAB — ECHOCARDIOGRAM COMPLETE
Area-P 1/2: 4.39 cm2
Calc EF: 50.9 %
Height: 60 in
S' Lateral: 2.6 cm
Single Plane A2C EF: 53.9 %
Single Plane A4C EF: 55.7 %
Weight: 3211.2 [oz_av]

## 2023-09-25 LAB — GLUCOSE, CAPILLARY
Glucose-Capillary: 100 mg/dL — ABNORMAL HIGH (ref 70–99)
Glucose-Capillary: 136 mg/dL — ABNORMAL HIGH (ref 70–99)
Glucose-Capillary: 155 mg/dL — ABNORMAL HIGH (ref 70–99)
Glucose-Capillary: 74 mg/dL (ref 70–99)
Glucose-Capillary: 76 mg/dL (ref 70–99)

## 2023-09-25 MED ORDER — MAGNESIUM SULFATE 2 GM/50ML IV SOLN
2.0000 g | Freq: Once | INTRAVENOUS | Status: AC
  Administered 2023-09-25: 2 g via INTRAVENOUS
  Filled 2023-09-25: qty 50

## 2023-09-25 NOTE — Telephone Encounter (Signed)
Per Dr. Tenny Craw - patient will need a 14 day live zio after she is discharged from the hospital. She will also need an appointment with Dr. Servando Salina in the womens clinic next week.  Monitor has been ordered. Referral has been placed.

## 2023-09-25 NOTE — Progress Notes (Unsigned)
Enrolled patient for a 14 day Zio AT monitor to be mailed to pateint home  Ross/Tobb? To read

## 2023-09-25 NOTE — Progress Notes (Signed)
Rounding Note    Patient Name: Melissa Fleming Date of Encounter: 09/25/2023  [facility] Cardiologist: New  Subjective   Pt comfortable in bed   Feeling better than yesterday  Denies palpitations   Breathing is good  No chest pressure   Review Dx HTN at age 37  NOT under good control until placed on Labetaolol about 4 years ago   Pt says doing OK until about 1 wk ago  Felt fluttering, like heart beating in throat. No syncope   Breathing OK  Grandmother may have had heart failure  Died of dementia. No hx of sudden deaths or arrhythmias   Inpatient Medications    Scheduled Meds:  aspirin EC  162 mg Oral Daily   insulin aspart  0-15 Units Subcutaneous TID PC   labetalol  400 mg Oral Q8H   NIFEdipine  30 mg Oral Daily   prenatal multivitamin  1 tablet Oral Q1200   sodium chloride flush  3 mL Intravenous Q12H   Continuous Infusions:  sodium chloride 250 mL (09/24/23 2133)   PRN Meds: sodium chloride, acetaminophen, calcium carbonate, sodium chloride flush   Vital Signs    Vitals:   09/25/23 0400 09/25/23 0637 09/25/23 0750 09/25/23 1131  BP: (!) 136/93  126/86 124/81  Pulse: 91 89 89 84  Resp: 17  18 18   Temp: 97.8 F (36.6 C)  98 F (36.7 C) 98 F (36.7 C)  TempSrc: Axillary  Oral Oral  SpO2: 100%  100% 100%  Weight:      Height:        Intake/Output Summary (Last 24 hours) at 09/25/2023 1242 Last data filed at 09/25/2023 1610 Gross per 24 hour  Intake 59.13 ml  Output 1500 ml  Net -1440.87 ml      09/24/2023    4:46 PM 09/22/2023   12:21 PM 09/21/2023    9:47 AM  Last 3 Weights  Weight (lbs) 200 lb 11.2 oz 198 lb 3.2 oz 197 lb 4.8 oz  Weight (kg) 91.037 kg 89.903 kg 89.495 kg      Telemetry    SR   Occasional PVCs  (LBBB morphology) - Personally Reviewed  ECG    No new  - Personally Reviewed  Physical Exam   GEN: No acute distress.   Neck: No JVD Cardiac: RRR, no murmurs Respiratory: Clear to auscultation GI: Soft,  nontender MS: No edema;2 + pulses    Labs    High Sensitivity Troponin:   Recent Labs  Lab 09/24/23 1854 09/24/23 2055  TROPONINIHS 4 5     Chemistry Recent Labs  Lab 09/22/23 1326 09/24/23 1854 09/25/23 0512  NA 135 136 135  K 3.8 4.5 4.0  CL 105 104 107  CO2 21* 20* 19*  GLUCOSE 55* 53* 78  BUN 7 9 10   CREATININE 0.74 0.63 0.69  CALCIUM 9.2 9.3 9.1  MG  --  1.8 1.8  PROT 7.0 6.5 6.2*  ALBUMIN 3.0* 2.8* 2.6*  AST 16 14* 14*  ALT 12 11 11   ALKPHOS 92 92 77  BILITOT 0.7 0.5 0.4  GFRNONAA >60 >60 >60  ANIONGAP 9 12 9     Lipids No results for input(s): "CHOL", "TRIG", "HDL", "LABVLDL", "LDLCALC", "CHOLHDL" in the last 168 hours.  Hematology Recent Labs  Lab 09/22/23 1454 09/24/23 1854 09/25/23 0512  WBC 7.4 7.4 9.2  RBC 3.99 4.15 4.03  HGB 11.8* 12.1 11.9*  HCT 34.5* 36.6 35.3*  MCV 86.5 88.2 87.6  MCH  29.6 29.2 29.5  MCHC 34.2 33.1 33.7  RDW 13.2 13.2 13.2  PLT 347 347 368   Thyroid  Recent Labs  Lab 09/24/23 1854  TSH 1.237    BNP Recent Labs  Lab 09/24/23 2055  BNP 74.1    DDimer No results for input(s): "DDIMER" in the last 168 hours.   Radiology    No results found.  Cardiac Studies   Echo just done   Patient Profile     37 y.o. female with a history of chronic hypertension persents at 33 weeks and 4 days and is pending   Assessment & Plan    1 PVCs  Pt with isolated monomorphic PVCs   Today not a high burden      I would recomm ambulate and follow on tele WHen she does go home I would set her up for a 3 wk live monitor  2  HTN   BP is improved today from yesterday  Has been on labetalol   400 bid   Nifedipine XL 30 mg added    Would continue to follow   3  Abnormal echo      I have reviewed images   Full report to follow  LVEF/RVEF are normal  LV is normal size  The LV is  moderate to severely thickened.  This may be due to HTN that was not optimally controlled for a decade.  It would be important to follow after with MRI,  evaluate for other processes such as hypertrophic cardiomyopathy.   There is no obstruction to outflow on her echo   She has no FHx of myopathies or sudden death  Ambulate     IF feels OK and BP OK can go home    I will make sure she has appt at cardio OB clinic in next 1-2 wks   I will also arrange for live monitor       For questions or updates, please contact [facility] Please consult [email or web address] for contact info under        Signed, Dietrich Pates, MD  09/25/2023, 12:42 PM

## 2023-09-25 NOTE — Inpatient Diabetes Management (Addendum)
Inpatient Diabetes Program Recommendations  ADA Standards of Care 2024 Diabetes in Pregnancy Target Glucose Ranges:  Fasting: 70 - 95 mg/dL 1 hr postprandial:  784 - 140mg /dL (from first bite of meal) 2 hr postprandial:  100 - 120 mg/dL (from first bit of meal)    Lab Results  Component Value Date   GLUCAP 74 09/25/2023   HGBA1C 5.2 04/30/2023    Review of Glycemic Control  Latest Reference Range & Units 09/25/23 00:32 09/25/23 05:37  Glucose-Capillary 70 - 99 mg/dL 696 (H) 74   Diabetes history: DM  Outpatient Diabetes medications:  Diabeta 5 mg daily Current orders for Inpatient glycemic control:  Novolog 0-15 units tid (after meals)- 2 hour PP  Inpatient Diabetes Program Recommendations:    Agree with current orders.   Will follow.   Thanks,  Lorenza Cambridge, RN, BC-ADM Inpatient Diabetes Coordinator Pager [phone]  (8a-5p)

## 2023-09-25 NOTE — Progress Notes (Signed)
Daily Antepartum Note  Admission Date: 09/24/2023 Current Date: 09/25/2023 12:33 AM  Melissa Fleming is a 37 y.o. [id number] at [redacted]w[redacted]d, admitted for cardiac observation, severe pre-eclampsia rule out.  Pregnancy complicated by: Patient Active Problem List   Diagnosis Date Noted   PVC (premature ventricular contraction) 09/24/2023   Fetal growth restriction antepartum 09/11/2023   Oral hypoglycemic controlled White classification A2 gestational diabetes mellitus (GDM) 08/31/2023   Carpal tunnel syndrome during pregnancy 07/28/2023   Abnormal chromosomal and genetic finding on antenatal screening mother 06/15/2023   BMI 36.0-36.9,adult 04/30/2023   Supervision of high-risk pregnancy 04/29/2023   Chronic hypertension affecting pregnancy 01/25/2020   PCOS (polycystic ovarian syndrome) 01/25/2020    Overnight/24hr events:  Patient just admitted  Subjective:  She continues to deny any chest pain, SOB, palpitations, dizziness or lightheadedness; she states she never had any of these s/s. She also continues to deny any preterm labor, leakage of fluid, vaginal bleeding and no neuro s/s such as HA, change in vision  Objective:    Current Vital Signs 24h Vital Sign Ranges  T 98.5 F (36.9 C) Temp  Avg: 98.5 F (36.9 C)  Min: 98.5 F (36.9 C)  Max: 98.5 F (36.9 C)  BP 132/88 BP  Min: 123/77  Max: 163/89  HR 97 Pulse  Avg: 83.2  Min: 75  Max: 99  RR 16 Resp  Avg: 16.3  Min: 16  Max: 17  SaO2 100 % Room Air SpO2  Avg: 99.5 %  Min: 92 %  Max: 100 %       24 Hour I/O Current Shift I/O  Time Ins Outs 12/19 0701 - 12/20 0700 In: 50  Out: 100 [Urine:100] 12/19 1901 - 12/20 0700 In: 50  Out: 100 [Urine:100]   Patient Vitals for the past 24 hrs:  BP Temp Temp src Pulse Resp SpO2 Height Weight  09/25/23 0030 132/88 -- -- 97 16 100 % -- --  09/24/23 2233 123/77 -- -- 99 -- -- -- --  09/24/23 2112 (!) 133/96 -- -- 84 17 100 % -- --  09/24/23 2105 (!) 148/104 -- -- 81 -- 100 % -- --   09/24/23 2030 (!) 147/96 -- -- 78 -- 100 % -- --  09/24/23 2000 (!) 160/97 -- -- 75 -- 100 % -- --  09/24/23 1951 (!) 150/101 -- -- 85 -- 100 % -- --  09/24/23 1945 (!) 150/101 -- -- 37 -- -- -- --  09/24/23 1940 -- -- -- -- -- 99 % -- --  09/24/23 1935 -- -- -- -- -- 99 % -- --  09/24/23 1931 (!) 163/89 -- -- 86 -- -- -- --  09/24/23 1930 -- -- -- -- -- 99 % -- --  09/24/23 1925 -- -- -- -- -- 100 % -- --  09/24/23 1920 -- -- -- -- -- 100 % -- --  09/24/23 1916 -- -- -- 86 -- -- -- --  09/24/23 1915 -- -- -- -- -- 100 % -- --  09/24/23 1910 -- -- -- -- -- 100 % -- --  09/24/23 1905 -- -- -- -- -- 100 % -- --  09/24/23 1901 (!) 158/106 -- -- 84 -- -- -- --  09/24/23 1900 -- -- -- -- -- 100 % -- --  09/24/23 1846 (!) 155/91 -- -- 83 -- -- -- --  09/24/23 1831 (!) 159/102 -- -- 83 -- -- -- --  09/24/23 1830 -- -- -- -- -- 92 % -- --  09/24/23 1820 -- -- -- -- -- 99 % -- --  09/24/23 1816 (!) 149/98 -- -- 81 -- -- -- --  09/24/23 1815 -- -- -- -- -- 100 % -- --  09/24/23 1801 (!) 139/95 -- -- 82 -- -- -- --  09/24/23 1800 -- -- -- -- -- 100 % -- --  09/24/23 1755 -- -- -- -- -- 99 % -- --  09/24/23 1750 -- -- -- -- -- 100 % -- --  09/24/23 1746 (!) 154/105 -- -- 78 -- -- -- --  09/24/23 1745 -- -- -- -- -- 99 % -- --  09/24/23 1740 -- -- -- -- -- 100 % -- --  09/24/23 1735 -- -- -- -- -- 100 % -- --  09/24/23 1730 (!) 147/101 -- -- 83 -- 99 % -- --  09/24/23 1725 -- -- -- -- -- 100 % -- --  09/24/23 1720 -- -- -- -- -- 100 % -- --  09/24/23 1716 (!) 160/101 -- -- 76 -- -- -- --  09/24/23 1715 -- -- -- -- -- 100 % -- --  09/24/23 1710 -- -- -- -- -- 100 % -- --  09/24/23 1705 -- -- -- -- -- 99 % -- --  09/24/23 1700 (!) 132/95 -- -- 83 -- 100 % -- --  09/24/23 1655 -- -- -- -- -- 100 % -- --  09/24/23 1650 -- -- -- -- -- 100 % -- --  09/24/23 1646 (!) 150/97 98.5 F (36.9 C) Oral 81 16 100 % 5' (1.524 m) 91 kg   Physical exam: General: Well nourished, well developed female  in no acute distress. Respiratory: no respiratory distress Extremities: no clubbing, cyanosis or edema Neuro: Grossly normal   Medications: Current Facility-Administered Medications  Medication Dose Route Frequency Provider Last Rate Last Admin   0.9 %  sodium chloride infusion  250 mL Intravenous PRN [place] Bing, MD 10 mL/hr at 09/24/23 2133 250 mL at 09/24/23 2133   acetaminophen (TYLENOL) tablet 650 mg  650 mg Oral Q4H PRN [place] Bing, MD       aspirin EC tablet 162 mg  162 mg Oral Daily [place] Bing, MD       calcium carbonate (TUMS - dosed in mg elemental calcium) chewable tablet 400 mg of elemental calcium  2 tablet Oral Q4H PRN [place] Bing, MD       insulin aspart (novoLOG) injection 0-15 Units  0-15 Units Subcutaneous TID PC [place] Bing, MD       labetalol (NORMODYNE) tablet 400 mg  400 mg Oral Q8H [place] Bing, MD       NIFEdipine (PROCARDIA-XL/NIFEDICAL-XL) 24 hr tablet 30 mg  30 mg Oral Daily [place] Door B, MD   30 mg at 09/24/23 2038   prenatal multivitamin tablet 1 tablet  1 tablet Oral Q1200 [place] Bing, MD       sodium chloride flush (NS) 0.9 % injection 3 mL  3 mL Intravenous Q12H [place] Bing, MD   3 mL at 09/24/23 2120   sodium chloride flush (NS) 0.9 % injection 3 mL  3 mL Intravenous PRN [place] Bing, MD        Labs:  Negative troponin x 2, BNP (74) Recent Labs  Lab 09/22/23 1454 09/24/23 1854  WBC 7.4 7.4  HGB 11.8* 12.1  HCT 34.5* 36.6  PLT 347 347    Recent Labs  Lab 09/22/23 1326 09/24/23 1854  NA 135 136  K 3.8 4.5  CL 105 104  CO2 21* 20*  BUN 7 9  CREATININE 0.74 0.63  CALCIUM 9.2 9.3  PROT 7.0 6.5  BILITOT 0.7 0.5  ALKPHOS 92 92  ALT 12 11  AST 16 14*  GLUCOSE 55* 53*   Radiology:  No new imaging 12/17: afi 12, 6/10>8/10, elev UA 12/6: efw 5%, 1548g, ac 11%, wnl UA, afi 11, 8/8 Assessment & Plan:  Patient stable *Pregnancy: fetal status reassuring; continue with bid NSTs. Antenatal testing  currently UTD.  *CHTN: patient has been suboptimally controlled throughout her pregnancy. No e/o of overt pre-eclampsia currently but she certainly has risk factors with FGR (w/ elevated cord dopplers) and GDMA2, in addition to already having CHTN. Continue to follow closely. *New frequent PVCs: Cardiology following and appreciate recommendations. Procardia added to her labetalol regimen. Continue tele and follow up echo in AM.  *Preterm: no current issues *GDMA2: follow up AM fasting and two hour post prandial CBGs; SSI ordered.  *FGR: no current issues *PPx: SCDs, OOB ad lib *FEN/GI: SLIV, DM diet *Dispo: pending cardiology evaluation  Cornelia Copa MD Attending Center for [facility] (Faculty Practice) GYN Consult Phone: [phone] (M-F, 0800-1700) & [phone]  (Off hours, weekends, holidays)

## 2023-09-25 NOTE — Progress Notes (Signed)
  Echocardiogram 2D Echocardiogram has been performed.  Melissa Fleming 09/25/2023, 11:36 AM

## 2023-09-25 NOTE — Progress Notes (Signed)
OB Note 09/25/2023 0717 Mg 2gm IV x1 ordered Tele overnight showed tachy in the 110s-120s and, most recently she had runs of PVCs every 3rd beat and every 4th beat at 0600 and 16109, respectively   Cornelia Copa MD Attending Center for [facility] (Faculty Practice) GYN Consult Phone: [phone] (M-F, 0800-1700) & [phone] (Off hours, weekends, holidays)

## 2023-09-26 ENCOUNTER — Other Ambulatory Visit (HOSPITAL_COMMUNITY): Payer: Self-pay

## 2023-09-26 DIAGNOSIS — O26893 Other specified pregnancy related conditions, third trimester: Secondary | ICD-10-CM | POA: Diagnosis not present

## 2023-09-26 DIAGNOSIS — I493 Ventricular premature depolarization: Secondary | ICD-10-CM | POA: Diagnosis not present

## 2023-09-26 DIAGNOSIS — Z3A34 34 weeks gestation of pregnancy: Secondary | ICD-10-CM | POA: Diagnosis not present

## 2023-09-26 LAB — GLUCOSE, CAPILLARY
Glucose-Capillary: 75 mg/dL (ref 70–99)
Glucose-Capillary: 102 mg/dL — ABNORMAL HIGH (ref 70–99)

## 2023-09-26 MED ORDER — LABETALOL HCL 200 MG PO TABS
400.0000 mg | ORAL_TABLET | Freq: Three times a day (TID) | ORAL | 1 refills | Status: AC
  Filled 2023-09-26: qty 180, 30d supply, fill #0

## 2023-09-26 MED ORDER — NIFEDIPINE ER 30 MG PO TB24
30.0000 mg | ORAL_TABLET | Freq: Every day | ORAL | 1 refills | Status: DC
  Filled 2023-09-26: qty 30, 30d supply, fill #0

## 2023-09-26 NOTE — Discharge Summary (Signed)
Physician Discharge Summary  Patient ID: Melissa Fleming MRN: [id number] DOB/AGE: [birth year] 37 y.o.  Admit date: 09/24/2023 Discharge date: 09/26/2023  Admission Diagnoses: [redacted]w[redacted]d PVCs symptomatic cHTN T2DM(class B DM) FGR with ^UAD  Discharge Diagnoses:  Principal Problem:   PVC (premature ventricular contraction) Active Problems:   Chronic hypertension affecting pregnancy   Oral hypoglycemic controlled White classification A2 gestational diabetes mellitus (GDM)   Discharged Condition: stable  Hospital Course:  Admitted with [facility] frequency BP inadequately contolled OB Cards consulted -->added procardia xl 30, ECHO shows some LVH, likely due to chronic, unmanaged BP prior to pregnancy, Zio AT monitor has been ordered On glyburide for management with reasonable control  Consults: OB Cards  Significant Diagnostic Studies: ECHO  Treatments: PVC and BP management  Discharge Exam: Blood pressure (!) 133/95, pulse 79, temperature 98.1 F (36.7 C), temperature source Oral, resp. rate 16, height 5' (1.524 m), weight 91 kg, SpO2 99%. General appearance: alert, cooperative, and no distress GI: soft, non-tender; bowel sounds normal; no masses,  no organomegaly  NST reactive prior to discharge  Disposition: Discharge disposition: 01-Home or Self Care       Discharge Instructions     Call MD for:  persistant nausea and vomiting   Complete by: As directed    Call MD for:  severe uncontrolled pain   Complete by: As directed    Call MD for:  temperature >100.4   Complete by: As directed    Diet - low sodium heart healthy   Complete by: As directed    Increase activity slowly   Complete by: As directed       Allergies as of 09/26/2023       Reactions   Morphine Hives        Medication List     TAKE these medications    Accu-Chek Guide test strip Generic drug: glucose blood Use as instructed QID   Accu-Chek Guide w/Device Kit USE AS DIRECTED ONCE DAILY  TO CHECK BLOOD SUGAR   Accu-Chek Softclix Lancets lancets Use as instructed QID   aspirin EC 81 MG tablet Take 2 tablets (162 mg total) by mouth daily.   glyBURIDE 5 MG tablet Commonly known as: DIABETA Take 1 tablet (5 mg total) by mouth daily with breakfast.   labetalol 200 MG tablet Commonly known as: NORMODYNE Take 2 tablets (400 mg total) by mouth 3 (three) times daily. What changed: how much to take   NIFEdipine 30 MG 24 hr tablet Commonly known as: ADALAT CC Take 1 tablet (30 mg total) by mouth daily. Start taking on: September 27, 2023        Follow-up Information     Center for [facility] at [facility] Follow up on 09/28/2023.   Specialty: Obstetrics and Gynecology Why: 1:15 pm-->scheduled Contact information: [address] [place] [address] [phone]                Signed: Lazaro Arms 09/26/2023, 10:17 AM

## 2023-09-26 NOTE — Progress Notes (Signed)
Pt discharged home in stable condition after discharge instructions given. Pt verbalized understanding and all questions were answered. IV was discontinued and pt was sent home with all belongings.

## 2023-09-28 ENCOUNTER — Encounter: Payer: Self-pay | Admitting: *Deleted

## 2023-09-28 ENCOUNTER — Ambulatory Visit: Payer: Medicaid Other | Attending: Maternal & Fetal Medicine

## 2023-09-28 ENCOUNTER — Ambulatory Visit: Payer: Medicaid Other | Attending: Obstetrics and Gynecology | Admitting: Obstetrics and Gynecology

## 2023-09-28 ENCOUNTER — Ambulatory Visit: Payer: Medicaid Other | Admitting: *Deleted

## 2023-09-28 VITALS — BP 136/89 | HR 78

## 2023-09-28 DIAGNOSIS — O10013 Pre-existing essential hypertension complicating pregnancy, third trimester: Secondary | ICD-10-CM

## 2023-09-28 DIAGNOSIS — O24419 Gestational diabetes mellitus in pregnancy, unspecified control: Secondary | ICD-10-CM | POA: Insufficient documentation

## 2023-09-28 DIAGNOSIS — O10913 Unspecified pre-existing hypertension complicating pregnancy, third trimester: Secondary | ICD-10-CM

## 2023-09-28 DIAGNOSIS — E669 Obesity, unspecified: Secondary | ICD-10-CM | POA: Diagnosis not present

## 2023-09-28 DIAGNOSIS — O2623 Pregnancy care for patient with recurrent pregnancy loss, third trimester: Secondary | ICD-10-CM

## 2023-09-28 DIAGNOSIS — O0993 Supervision of high risk pregnancy, unspecified, third trimester: Secondary | ICD-10-CM | POA: Insufficient documentation

## 2023-09-28 DIAGNOSIS — O365990 Maternal care for other known or suspected poor fetal growth, unspecified trimester, not applicable or unspecified: Secondary | ICD-10-CM | POA: Diagnosis not present

## 2023-09-28 DIAGNOSIS — Z3A34 34 weeks gestation of pregnancy: Secondary | ICD-10-CM

## 2023-09-28 DIAGNOSIS — O365930 Maternal care for other known or suspected poor fetal growth, third trimester, not applicable or unspecified: Secondary | ICD-10-CM

## 2023-09-28 DIAGNOSIS — O99213 Obesity complicating pregnancy, third trimester: Secondary | ICD-10-CM | POA: Diagnosis not present

## 2023-09-28 DIAGNOSIS — O99210 Obesity complicating pregnancy, unspecified trimester: Secondary | ICD-10-CM

## 2023-09-28 DIAGNOSIS — O24415 Gestational diabetes mellitus in pregnancy, controlled by oral hypoglycemic drugs: Secondary | ICD-10-CM | POA: Insufficient documentation

## 2023-09-28 NOTE — Procedures (Signed)
Melissa Fleming 1985/10/26 [redacted]w[redacted]d  Fetus A Non-Stress Test Interpretation for 09/28/23  Indication: IUGR, Chronic Hypertenstion, Gestational Diabetes medication controlled, and Obesity  Fetal Heart Rate A Mode: External Baseline Rate (A): 130 bpm Variability: Moderate Accelerations: 15 x 15 Decelerations: None Multiple birth?: No  Uterine Activity Mode: Toco Contraction Frequency (min): None Resting Tone Palpated: Relaxed  Interpretation (Fetal Testing) Nonstress Test Interpretation: Reactive Comments: Tracing reviewed by Dr. Judeth Cornfield

## 2023-09-28 NOTE — Progress Notes (Signed)
MFM Follow-up Consultation  Fetal growth restriction-on ultrasound performed 2 weeks ago, the estimated fetal weight was at the 5th percentile.  Umbilical artery Doppler study was elevated at previous scan.  -Chronic hypertension.  Patient was recently admitted to the hospital for blood pressure control.  She is on 2 antihypertensive medications--labetalol and nifedipine.  Blood pressures today at our office were 153/92 and 136/89 mmHg.  She does not have signs and symptoms of severe features of preeclampsia.  She reports good fetal movements.  -Gestational diabetes.  Patient takes glyburide 5 mg once daily with breakfast. On today's ultrasound, amniotic fluid is normal good fetal activity seen.  Umbilical artery Doppler showed increased S/D ratio.  NST is reactive.  BPP 10/10.  I counseled the patient on timing of delivery.  Patient required admission for control of hypertension and is on 2 antihypertensive medications.  Her pregnancy is also complicated by diabetes and fetal growth restriction with abnormal Doppler studies. If fetal growth restriction is not isolated and has concurrent condition of hypertension and diabetes, delivery at [redacted] weeks gestation is reasonable.  I counseled the patient that delivery at [redacted] weeks gestation increases slightly the likelihood of neonatal intensive care.  Preterm infants have a higher likelihood of respiratory distress syndrome.  Patient agreed to be delivered at [redacted] weeks gestation.  Recommendations -NST on 10/01/23 and then twice weekly till delivery. -Fetal growth on 10/05/23. -Delivery at [redacted] weeks gestation. Consultation including face-to-face (more than 50%) counseling 20 minutes.

## 2023-10-01 ENCOUNTER — Ambulatory Visit: Payer: Medicaid Other | Admitting: *Deleted

## 2023-10-01 ENCOUNTER — Encounter (HOSPITAL_COMMUNITY): Payer: Self-pay | Admitting: Obstetrics and Gynecology

## 2023-10-01 ENCOUNTER — Observation Stay (HOSPITAL_COMMUNITY)
Admission: AD | Admit: 2023-10-01 | Discharge: 2023-10-02 | Disposition: A | Discharge status: Home / Self Care | Payer: Medicaid Other | Source: Skilled Nursing Facility | Attending: Obstetrics and Gynecology | Admitting: Obstetrics and Gynecology

## 2023-10-01 ENCOUNTER — Encounter: Payer: Self-pay | Admitting: *Deleted

## 2023-10-01 ENCOUNTER — Ambulatory Visit: Payer: Medicaid Other | Attending: Maternal & Fetal Medicine | Admitting: Maternal & Fetal Medicine

## 2023-10-01 ENCOUNTER — Other Ambulatory Visit: Payer: Self-pay

## 2023-10-01 VITALS — BP 149/98 | HR 81

## 2023-10-01 DIAGNOSIS — Z3A35 35 weeks gestation of pregnancy: Secondary | ICD-10-CM | POA: Insufficient documentation

## 2023-10-01 DIAGNOSIS — O99213 Obesity complicating pregnancy, third trimester: Secondary | ICD-10-CM | POA: Insufficient documentation

## 2023-10-01 DIAGNOSIS — O368390 Maternal care for abnormalities of the fetal heart rate or rhythm, unspecified trimester, not applicable or unspecified: Secondary | ICD-10-CM

## 2023-10-01 DIAGNOSIS — O10919 Unspecified pre-existing hypertension complicating pregnancy, unspecified trimester: Secondary | ICD-10-CM

## 2023-10-01 DIAGNOSIS — O10013 Pre-existing essential hypertension complicating pregnancy, third trimester: Secondary | ICD-10-CM | POA: Diagnosis not present

## 2023-10-01 DIAGNOSIS — Z7984 Long term (current) use of oral hypoglycemic drugs: Secondary | ICD-10-CM | POA: Insufficient documentation

## 2023-10-01 DIAGNOSIS — O365930 Maternal care for other known or suspected poor fetal growth, third trimester, not applicable or unspecified: Secondary | ICD-10-CM

## 2023-10-01 DIAGNOSIS — Z3A34 34 weeks gestation of pregnancy: Secondary | ICD-10-CM

## 2023-10-01 DIAGNOSIS — O09523 Supervision of elderly multigravida, third trimester: Secondary | ICD-10-CM | POA: Insufficient documentation

## 2023-10-01 DIAGNOSIS — O24419 Gestational diabetes mellitus in pregnancy, unspecified control: Secondary | ICD-10-CM | POA: Insufficient documentation

## 2023-10-01 DIAGNOSIS — Z7982 Long term (current) use of aspirin: Secondary | ICD-10-CM | POA: Diagnosis not present

## 2023-10-01 DIAGNOSIS — O365990 Maternal care for other known or suspected poor fetal growth, unspecified trimester, not applicable or unspecified: Secondary | ICD-10-CM

## 2023-10-01 DIAGNOSIS — O0993 Supervision of high risk pregnancy, unspecified, third trimester: Secondary | ICD-10-CM

## 2023-10-01 HISTORY — DX: Gestational diabetes mellitus in pregnancy, unspecified control: O24.419

## 2023-10-01 LAB — CBC
HCT: 34.3 % — ABNORMAL LOW (ref 36.0–46.0)
Hemoglobin: 11.5 g/dL — ABNORMAL LOW (ref 12.0–15.0)
MCH: 29.3 pg (ref 26.0–34.0)
MCHC: 33.5 g/dL (ref 30.0–36.0)
MCV: 87.3 fL (ref 80.0–100.0)
Platelets: 310 10*3/uL (ref 150–400)
RBC: 3.93 MIL/uL (ref 3.87–5.11)
RDW: 13.5 % (ref 11.5–15.5)
WBC: 5.9 10*3/uL (ref 4.0–10.5)
nRBC: 0 % (ref 0.0–0.2)

## 2023-10-01 LAB — COMPREHENSIVE METABOLIC PANEL
ALT: 11 U/L (ref 0–44)
AST: 16 U/L (ref 15–41)
Albumin: 2.7 g/dL — ABNORMAL LOW (ref 3.5–5.0)
Alkaline Phosphatase: 86 U/L (ref 38–126)
Anion gap: 11 (ref 5–15)
BUN: 12 mg/dL (ref 6–20)
CO2: 19 mmol/L — ABNORMAL LOW (ref 22–32)
Calcium: 9 mg/dL (ref 8.9–10.3)
Chloride: 105 mmol/L (ref 98–111)
Creatinine, Ser: 0.74 mg/dL (ref 0.44–1.00)
GFR, Estimated: >60 mL/min (ref >60)
Glucose, Bld: 112 mg/dL — ABNORMAL HIGH (ref 70–99)
Potassium: 3.7 mmol/L (ref 3.5–5.1)
Sodium: 135 mmol/L (ref 135–145)
Total Bilirubin: 0.5 mg/dL (ref <1.2)
Total Protein: 6.3 g/dL — ABNORMAL LOW (ref 6.5–8.1)

## 2023-10-01 LAB — GLUCOSE, CAPILLARY
Glucose-Capillary: 124 mg/dL — ABNORMAL HIGH (ref 70–99)
Glucose-Capillary: 95 mg/dL (ref 70–99)

## 2023-10-01 LAB — OB RESULTS CONSOLE GBS: GBS: POSITIVE

## 2023-10-01 LAB — TYPE AND SCREEN
ABO/RH(D): O POS
Antibody Screen: NEGATIVE

## 2023-10-01 MED ORDER — ASPIRIN 81 MG PO TBEC
162.0000 mg | DELAYED_RELEASE_TABLET | Freq: Every day | ORAL | Status: DC
  Administered 2023-10-02: 162 mg via ORAL
  Filled 2023-10-01: qty 2

## 2023-10-01 MED ORDER — PRENATAL MULTIVITAMIN CH: 1.0000 | ORAL_TABLET | Freq: Every day | ORAL | Status: DC

## 2023-10-01 MED ORDER — ACETAMINOPHEN 325 MG PO TABS: 650.0000 mg | ORAL_TABLET | ORAL | Status: DC | PRN

## 2023-10-01 MED ORDER — GLYBURIDE 5 MG PO TABS
5.0000 mg | ORAL_TABLET | Freq: Every day | ORAL | Status: DC
  Administered 2023-10-02: 5 mg via ORAL
  Filled 2023-10-01: qty 1

## 2023-10-01 MED ORDER — LABETALOL HCL 200 MG PO TABS
400.0000 mg | ORAL_TABLET | Freq: Three times a day (TID) | ORAL | Status: DC
  Administered 2023-10-01 – 2023-10-02 (×2): 400 mg via ORAL
  Filled 2023-10-01 (×2): qty 2

## 2023-10-01 MED ORDER — DOCUSATE SODIUM 100 MG PO CAPS
100.0000 mg | ORAL_CAPSULE | Freq: Every day | ORAL | Status: DC
  Administered 2023-10-02: 100 mg via ORAL
  Filled 2023-10-01: qty 1

## 2023-10-01 MED ORDER — NIFEDIPINE ER OSMOTIC RELEASE 30 MG PO TB24
30.0000 mg | ORAL_TABLET | Freq: Every day | ORAL | Status: DC
  Administered 2023-10-01 – 2023-10-02 (×2): 30 mg via ORAL
  Filled 2023-10-01 (×2): qty 1

## 2023-10-01 MED ORDER — LACTATED RINGERS IV SOLN: INTRAVENOUS | Status: DC

## 2023-10-01 MED ORDER — CALCIUM CARBONATE ANTACID 500 MG PO CHEW: 2.0000 | CHEWABLE_TABLET | ORAL | Status: DC | PRN

## 2023-10-01 NOTE — Progress Notes (Signed)
   Patient information  Patient Name: Melissa Fleming  Patient MRN:   [id number]  Referring practice: MFM Referring Provider: [facility])  MFM CONSULT  Melissa Fleming is a 37 y.o. [id number] at [redacted]w[redacted]d here for ultrasound and consultation. Patient Active Problem List   Diagnosis Date Noted   Fetal heart rate decelerations affecting management of mother 10/01/2023   PVC (premature ventricular contraction) 09/24/2023   Fetal growth restriction antepartum 09/11/2023   Oral hypoglycemic controlled White classification A2 gestational diabetes mellitus (GDM) 08/31/2023   Carpal tunnel syndrome during pregnancy 07/28/2023   Abnormal chromosomal and genetic finding on antenatal screening mother 06/15/2023   BMI 36.0-36.9,adult 04/30/2023   Supervision of high-risk pregnancy 04/29/2023   Chronic hypertension affecting pregnancy 01/25/2020    The patient was here for a nonstress test.  While the patient was on the monitor the fetus showed a 2-minute deceleration down to the 80s.  The fetal heart tracing quickly recovered with maternal position changes and was followed by a reactive tracing with accelerations and good variability.  I discussed my concern for the possibility of repetitive decelerations and the need for prolonged monitoring.  The patient verbalized understanding and will go straight to the MAU.   Recommendations -Sent to MAU for 2 hours of monitoring. If no further decelerations are noted, then the patient can be discharged with precautions -If there are further decelerations then admission should be considered.  Review of Systems: A review of systems was performed and was negative except per HPI   Vitals and Physical Exam    10/01/2023   10:52 AM 09/28/2023    1:28 PM 09/28/2023    1:25 PM  Vitals with BMI  Systolic 149 136 528  Diastolic 98 89 92  Pulse 81 78 74  Sitting comfortably on the sonogram table Nonlabored breathing Normal rate and  rhythm Abdomen is nontender  Past pregnancies OB History  Gravida Para Term Preterm AB Living  5 1  1 3    SAB IAB Ectopic Multiple Live Births  3    0    # Outcome Date GA Lbr Len/2nd Weight Sex Type Anes PTL Lv  5 Current           4 SAB 12/14/21          3 Preterm           2 SAB           1 SAB         FD     Birth Comments: 5 months    Obstetric Comments  -2nd miscarriage- she was around 5mos.  Had delivery at hospital, did not know she was pregnant    I spent 20 minutes reviewing the patients chart, including labs and images as well as counseling the patient about her medical conditions. Greater than 50% of the time was spent in direct face-to-face patient counseling.  Braxton Feathers  MFM, [facility]   10/01/2023  12:02 PM

## 2023-10-01 NOTE — Procedures (Signed)
Melissa Fleming 08/14/1986 [redacted]w[redacted]d  Fetus A Non-Stress Test Interpretation for 10/01/23-NST only  Indication: Chronic Hypertenstion  Fetal Heart Rate A Mode: External Baseline Rate (A): 135 bpm Variability: Moderate Accelerations: 15 x 15 Decelerations: Variable (2 min decel to 80bpm) Multiple birth?: No  Uterine Activity Mode: Toco Contraction Frequency (min): 1 u/c during NST Contraction Duration (sec): 50 Contraction Quality: Mild Resting Tone Palpated: Relaxed  Interpretation (Fetal Testing) Nonstress Test Interpretation: Non-reactive Comments: tracing reviewed byDr. Darra Lis

## 2023-10-01 NOTE — H&P (Signed)
History     CSN: [id number]  Arrival date and time: 10/01/23 1220   Event Date/Time   First Provider Initiated Contact with Patient 10/01/23 1331      Chief Complaint  Patient presents with   extended monitoring   HPI [name] is a 37 y.o. year old [id number] female at [redacted]w[redacted]d weeks gestation who was sent to MAU by MFM for having a 2-minute decel on nonstress test while in the office today followed by a reactive NST.  She denies any vaginal bleeding pain or loss of fluid.  She reports good positive fetal movement.  Her pregnancy is complicated by fetal growth restriction, A2 GDM (glyburide) and chronic hypertension (labetalol 400 mg 3 times daily). [facility] receives [facility]; next appt is 10/08/2023. Next appt with MFM is scheduled for 10/05/2023.   OB History     Gravida  5   Para  1   Term      Preterm  1   AB  3   Living         SAB  3   IAB      Ectopic      Multiple      Live Births  0        Obstetric Comments  -2nd miscarriage- she was around 5mos.  Had delivery at hospital, did not know she was pregnant         Past Medical History:  Diagnosis Date   Gestational diabetes    Hypertension    PCOS (polycystic ovarian syndrome)    PCOS (polycystic ovarian syndrome) 01/25/2020   Sees Dr Ermalinda Memos, on metformin     Prediabetes     Past Surgical History:  Procedure Laterality Date   WISDOM TOOTH EXTRACTION      Family History  Problem Relation Age of Onset   Diabetes Mother    Hypertension Mother    Mental illness Sister    Cancer Maternal Grandmother    Hypertension Maternal Grandmother    Cancer Maternal Grandfather        Bone   Diabetes Paternal Grandmother     Social History   Tobacco Use   Smoking status: Never   Smokeless tobacco: Never  Vaping Use   Vaping status: Never Used  Substance Use Topics   Alcohol use: Not Currently    Comment: occasionally   Drug use: No    Allergies:  Allergies  Allergen  Reactions   Morphine Hives    Medications Prior to Admission  Medication Sig Dispense Refill Last Dose/Taking   aspirin EC 81 MG tablet Take 2 tablets (162 mg total) by mouth daily. 60 tablet 6 10/01/2023   glyBURIDE (DIABETA) 5 MG tablet Take 1 tablet (5 mg total) by mouth daily with breakfast. 30 tablet 2 10/01/2023   labetalol (NORMODYNE) 200 MG tablet Take 2 tablets (400 mg total) by mouth 3 (three) times daily. 180 tablet 1 10/01/2023   NIFEdipine (ADALAT CC) 30 MG 24 hr tablet Take 1 tablet (30 mg total) by mouth daily. 30 tablet 1 09/30/2023   Prenatal Vit-Fe Fumarate-FA (PRENATAL MULTIVITAMIN) TABS tablet Take 1 tablet by mouth daily at 12 noon.   10/01/2023   Accu-Chek Softclix Lancets lancets Use as instructed QID 100 each 12    Blood Glucose Monitoring Suppl (ACCU-CHEK GUIDE) w/Device KIT USE AS DIRECTED ONCE DAILY TO CHECK BLOOD SUGAR      glucose blood (ACCU-CHEK GUIDE) test strip Use as instructed QID 100 each  12     Review of Systems  Constitutional: Negative.   HENT: Negative.    Eyes: Negative.   Respiratory: Negative.    Cardiovascular: Negative.   Gastrointestinal: Negative.   Endocrine: Negative.   Genitourinary:        Good (+) FM  Musculoskeletal: Negative.   Skin: Negative.   Allergic/Immunologic: Negative.   Neurological: Negative.   Hematological: Negative.   Psychiatric/Behavioral: Negative.     Physical Exam   Blood pressure 126/86, pulse 85, temperature 98 F (36.7 C), temperature source Oral, resp. rate 18, height 5' (1.524 m), weight 92 kg.  Physical Exam Vitals and nursing note reviewed.  Constitutional:      Appearance: Normal appearance. She is obese.  HENT:     Head: Normocephalic and atraumatic.  Cardiovascular:     Rate and Rhythm: Normal rate.  Pulmonary:     Effort: Pulmonary effort is normal.  Abdominal:     Palpations: Abdomen is soft.  Genitourinary:    Comments: deferred Musculoskeletal:        General: Normal range of  motion.  Skin:    General: Skin is warm and dry.  Neurological:     Mental Status: She is alert and oriented to person, place, and time.  Psychiatric:        Mood and Affect: Mood normal.        Behavior: Behavior normal.        Thought Content: Thought content normal.        Judgment: Judgment normal.    NST - FHR: 140 bpm / moderate variability / accels present / prolonged decel down to the 80s x ~ 2.5 mins / TOCO: irregular UC's  MAU Course  Procedures  MDM Prolonged EFM  *Consult with Dr. Jolayne Panther @ [phone] - notified of patient's complaints, assessments & NST results, tx plan admit to [facility] for NRNST -  agrees with plan and will enter admission orders Assessment and Plan   1. Fetal growth restriction antepartum   2. Chronic hypertension affecting pregnancy   3. Gestational diabetes mellitus (GDM) in third trimester, gestational diabetes method of control unspecified   4. [redacted] weeks gestation of pregnancy    - Admit to OBSCU - Routine Antenatal orders - Dr. Jolayne Panther assumes care of patient at time of admission   Melissa Fleming, CNM 10/01/2023, 1:32 PM

## 2023-10-01 NOTE — MAU Note (Signed)
.  Melissa Fleming is a 37 y.o. at [redacted]w[redacted]d here in MAU reporting: decel during scheduled NST for IUGR/placental insufficiency. Sent for extended monitoring.   Pain score: 0/10 Vitals:   10/01/23 1308  BP: 126/86  Pulse: 85  Resp: 18  Temp: 98 F (36.7 C)     FHT: 125

## 2023-10-02 DIAGNOSIS — Z3A35 35 weeks gestation of pregnancy: Secondary | ICD-10-CM

## 2023-10-02 DIAGNOSIS — O365930 Maternal care for other known or suspected poor fetal growth, third trimester, not applicable or unspecified: Secondary | ICD-10-CM | POA: Diagnosis not present

## 2023-10-02 LAB — GLUCOSE, CAPILLARY
Glucose-Capillary: 88 mg/dL (ref 70–99)
Glucose-Capillary: 87 mg/dL (ref 70–99)

## 2023-10-02 LAB — GC/CHLAMYDIA PROBE AMP (~~LOC~~) NOT AT ARMC
Chlamydia: NEGATIVE
Comment: NEGATIVE
Comment: NORMAL
Neisseria Gonorrhea: NEGATIVE

## 2023-10-02 NOTE — Plan of Care (Signed)
Patient to be discharged home with printed instructions. Toya Smothers, RN

## 2023-10-02 NOTE — Discharge Summary (Signed)
Patient ID: Melissa Fleming MRN: [id number] DOB/AGE: 1985/12/28 37 y.o.  Admit date: 10/01/2023 Discharge date: 10/02/2023  Admission Diagnoses:Fetal growth restriction antepartum  Chronic hypertension affecting pregnancy  Gestational diabetes mellitus (GDM) in third trimester, gestational diabetes method of control unspecified  [redacted] weeks gestation of pregnancy  Fetal heart rate decelerations affecting management of mother - Plan: Discharge patient   Discharge Diagnoses: same  Prenatal Procedures: fetal monitoring  Consults: none  Hospital Course:  This is a 37 y.o. [id number] with IUP at [redacted]w[redacted]d admitted for extended monitoring after fetal HR deceleration were recorded in MFM yesterday.  No leaking of fluid and no bleeding.    She was observed, fetal heart rate monitoring remained reassuring, and she had no signs/symptoms of preterm labor or other maternal-fetal concerns.  She was deemed stable for discharge to home with outpatient follow up.  Discharge Exam: Temp:  [98 F (36.7 C)-98.3 F (36.8 C)] 98.1 F (36.7 C) (12/27 0753) Pulse Rate:  [80-91] 89 (12/27 0753) Resp:  [17-20] 17 (12/27 0753) BP: (121-143)/(72-92) 121/72 (12/27 0753) SpO2:  [99 %-100 %] 100 % (12/27 0753) Weight:  [92 kg] 92 kg (12/26 1311) Physical Examination: CONSTITUTIONAL: Well-developed, well-nourished female in no acute distress.  HENT:  Normocephalic, atraumatic, External right and left ear normal. Oropharynx is clear and moist EYES: Conjunctivae and EOM are normal. Pupils are equal, round, and reactive to light. No scleral icterus.  NECK: Normal range of motion, supple, no masses SKIN: Skin is warm and dry. No rash noted. Not diaphoretic. No erythema. No pallor. NEUROLGIC: Alert and oriented to person, place, and time. Normal reflexes, muscle tone coordination. No cranial nerve deficit noted. PSYCHIATRIC: Normal mood and affect. Normal behavior. Normal judgment and thought content. CARDIOVASCULAR:  Normal heart rate noted, regular rhythm RESPIRATORY: Effort and breath sounds normal, no problems with respiration noted MUSCULOSKELETAL: Normal range of motion. No edema and no tenderness. 2+ distal pulses. ABDOMEN: Soft, nontender, nondistended, gravid. CERVIX:    Fetal monitoring: FHR: 130 bpm, Variability: moderate, Accelerations: Present, Decelerations: Absent  Uterine activity: 0 contractions per hour  Significant Diagnostic Studies:  Results for orders placed or performed during the hospital encounter of 10/01/23 (from the past week)  CBC   Collection Time: 10/01/23  2:10 PM  Result Value Ref Range   WBC 5.9 4.0 - 10.5 K/uL   RBC 3.93 3.87 - 5.11 MIL/uL   Hemoglobin 11.5 (L) 12.0 - 15.0 g/dL   HCT 14.7 (L) 82.9 - 56.2 %   MCV 87.3 80.0 - 100.0 fL   MCH 29.3 26.0 - 34.0 pg   MCHC 33.5 30.0 - 36.0 g/dL   RDW 13.0 86.5 - 78.4 %   Platelets 310 150 - 400 K/uL   nRBC 0.0 0.0 - 0.2 %  Comprehensive metabolic panel   Collection Time: 10/01/23  2:10 PM  Result Value Ref Range   Sodium 135 135 - 145 mmol/L   Potassium 3.7 3.5 - 5.1 mmol/L   Chloride 105 98 - 111 mmol/L   CO2 19 (L) 22 - 32 mmol/L   Glucose, Bld 112 (H) 70 - 99 mg/dL   BUN 12 6 - 20 mg/dL   Creatinine, Ser 6.96 0.44 - 1.00 mg/dL   Calcium 9.0 8.9 - 29.5 mg/dL   Total Protein 6.3 (L) 6.5 - 8.1 g/dL   Albumin 2.7 (L) 3.5 - 5.0 g/dL   AST 16 15 - 41 U/L   ALT 11 0 - 44 U/L   Alkaline Phosphatase 86 38 -  126 U/L   Total Bilirubin 0.5 <1.2 mg/dL   GFR, Estimated >47 >82 mL/min   Anion gap 11 5 - 15  Type and screen [facility]   Collection Time: 10/01/23  2:10 PM  Result Value Ref Range   ABO/RH(D) O POS    Antibody Screen NEG    Sample Expiration      10/04/2023,2359 Performed at [facility], [address] [place], [place] [address]   Culture, beta strep (group b only)   Collection Time: 10/01/23  2:35 PM   Specimen: Vaginal/Rectal; Genital  Result Value Ref Range   Specimen  Description VAGINAL/RECTAL    Special Requests NONE    Culture      CULTURE REINCUBATED FOR BETTER GROWTH Performed at [facility], [address], [place], [place] [address]    Report Status PENDING   Glucose, capillary   Collection Time: 10/01/23  6:38 PM  Result Value Ref Range   Glucose-Capillary 124 (H) 70 - 99 mg/dL  Glucose, capillary   Collection Time: 10/01/23  9:58 PM  Result Value Ref Range   Glucose-Capillary 95 70 - 99 mg/dL  Glucose, capillary   Collection Time: 10/02/23  5:47 AM  Result Value Ref Range   Glucose-Capillary 88 70 - 99 mg/dL  Glucose, capillary   Collection Time: 10/02/23 10:24 AM  Result Value Ref Range   Glucose-Capillary 87 70 - 99 mg/dL  Results for orders placed or performed during the hospital encounter of 09/24/23 (from the past week)  Glucose, capillary   Collection Time: 09/25/23 12:58 PM  Result Value Ref Range   Glucose-Capillary 155 (H) 70 - 99 mg/dL  Glucose, capillary   Collection Time: 09/25/23  8:07 PM  Result Value Ref Range   Glucose-Capillary 76 70 - 99 mg/dL  Glucose, capillary   Collection Time: 09/26/23  5:22 AM  Result Value Ref Range   Glucose-Capillary 75 70 - 99 mg/dL  Glucose, capillary   Collection Time: 09/26/23 10:02 AM  Result Value Ref Range   Glucose-Capillary 102 (H) 70 - 99 mg/dL    Discharge Condition: Stable  Disposition: Discharge disposition: 01-Home or Self Care        Discharge Instructions     Discharge patient   Complete by: As directed    Discharge disposition: 01-Home or Self Care   Discharge patient date: 10/02/2023      Allergies as of 10/02/2023       Reactions   Morphine Hives        Medication List     TAKE these medications    Accu-Chek Guide test strip Generic drug: glucose blood Use as instructed QID   Accu-Chek Guide w/Device Kit USE AS DIRECTED ONCE DAILY TO CHECK BLOOD SUGAR   Accu-Chek Softclix Lancets lancets Use as instructed QID   aspirin  EC 81 MG tablet Take 2 tablets (162 mg total) by mouth daily.   glyBURIDE 5 MG tablet Commonly known as: DIABETA Take 1 tablet (5 mg total) by mouth daily with breakfast.   labetalol 200 MG tablet Commonly known as: NORMODYNE Take 2 tablets (400 mg total) by mouth 3 (three) times daily.   NIFEdipine 30 MG 24 hr tablet Commonly known as: ADALAT CC Take 1 tablet (30 mg total) by mouth daily.   prenatal multivitamin Tabs tablet Take 1 tablet by mouth daily at 12 noon.        Follow-up Information     [facility]  for M[facility] at [facility] Follow up in 3 day(s).   Specialty: Maternal and Fetal Medicine Contact information: [address], [address] [place] [address] [phone]                Signed: Scheryl Darter M.D. 10/02/2023, 11:58 AM

## 2023-10-04 LAB — CULTURE, BETA STREP (GROUP B ONLY)

## 2023-10-05 ENCOUNTER — Ambulatory Visit: Payer: Medicaid Other | Attending: Maternal & Fetal Medicine

## 2023-10-05 ENCOUNTER — Ambulatory Visit: Payer: Medicaid Other | Admitting: *Deleted

## 2023-10-05 ENCOUNTER — Other Ambulatory Visit: Payer: Self-pay

## 2023-10-05 ENCOUNTER — Ambulatory Visit: Payer: Medicaid Other | Attending: Obstetrics | Admitting: Obstetrics

## 2023-10-05 ENCOUNTER — Encounter: Payer: Self-pay | Admitting: Obstetrics and Gynecology

## 2023-10-05 ENCOUNTER — Telehealth (HOSPITAL_COMMUNITY): Payer: Self-pay | Admitting: *Deleted

## 2023-10-05 ENCOUNTER — Encounter (HOSPITAL_COMMUNITY): Payer: Self-pay

## 2023-10-05 VITALS — BP 144/92 | HR 85

## 2023-10-05 DIAGNOSIS — O0993 Supervision of high risk pregnancy, unspecified, third trimester: Secondary | ICD-10-CM | POA: Diagnosis not present

## 2023-10-05 DIAGNOSIS — O10913 Unspecified pre-existing hypertension complicating pregnancy, third trimester: Secondary | ICD-10-CM

## 2023-10-05 DIAGNOSIS — O24419 Gestational diabetes mellitus in pregnancy, unspecified control: Secondary | ICD-10-CM | POA: Insufficient documentation

## 2023-10-05 DIAGNOSIS — O365930 Maternal care for other known or suspected poor fetal growth, third trimester, not applicable or unspecified: Secondary | ICD-10-CM

## 2023-10-05 DIAGNOSIS — O2623 Pregnancy care for patient with recurrent pregnancy loss, third trimester: Secondary | ICD-10-CM | POA: Diagnosis not present

## 2023-10-05 DIAGNOSIS — O365990 Maternal care for other known or suspected poor fetal growth, unspecified trimester, not applicable or unspecified: Secondary | ICD-10-CM | POA: Diagnosis not present

## 2023-10-05 DIAGNOSIS — O24415 Gestational diabetes mellitus in pregnancy, controlled by oral hypoglycemic drugs: Secondary | ICD-10-CM | POA: Diagnosis not present

## 2023-10-05 DIAGNOSIS — Z3A35 35 weeks gestation of pregnancy: Secondary | ICD-10-CM | POA: Diagnosis not present

## 2023-10-05 DIAGNOSIS — O10013 Pre-existing essential hypertension complicating pregnancy, third trimester: Secondary | ICD-10-CM | POA: Diagnosis not present

## 2023-10-05 DIAGNOSIS — E669 Obesity, unspecified: Secondary | ICD-10-CM | POA: Diagnosis not present

## 2023-10-05 DIAGNOSIS — O09523 Supervision of elderly multigravida, third trimester: Secondary | ICD-10-CM | POA: Diagnosis not present

## 2023-10-05 DIAGNOSIS — O99820 Streptococcus B carrier state complicating pregnancy: Secondary | ICD-10-CM | POA: Insufficient documentation

## 2023-10-05 DIAGNOSIS — O99213 Obesity complicating pregnancy, third trimester: Secondary | ICD-10-CM | POA: Diagnosis not present

## 2023-10-05 NOTE — Telephone Encounter (Signed)
Preadmission screen  

## 2023-10-05 NOTE — Progress Notes (Addendum)
MFM Note  Melissa Fleming is currently at 35 weeks and 3 days.  She was seen due to IUGR.  Her pregnancy has also been complicated by chronic hypertension treated with labetalol and gestational diabetes treated with glyburide.    She has been hospitalized twice over the past week due to a deceleration noted on her NST and another time for premature ventricular contractions. Nifedipine was added by cardiology for treatment of the PVCs.   The patient is scheduled to receive a ZIO AT monitor.    A plan had been made during her last admission for delivery at 36 weeks.    She denies any problems since her last exam and reports feeling vigorous fetal movements throughout the day.    On today's exam, the EFW of 4 pounds 9 ouncesmeasures at the 3rd percentile for her gestational age indicating severe IUGR.    The total AFI was 8.42 cm (within normal limits).  A BPP performed today was 10 out of 10 with a reactive NST.  The fetus is in the vertex presentation.  Doppler studies of the umbilical arteries continues to show an elevated S/D ratio of 7.91.  There were no signs of absent or reversed end-diastolic flow.    Due to severe IUGR, she will continue twice-weekly fetal testing until delivery.    I have contacted the second attending to schedule her for delivery at 36 weeks (this weekend).  I will also inquire from cardiology as to when she will receive her ZIO AT monitor.  She will return in 3 days for another NST.    The patient stated that all of her questions were answered today.  A total of 20 minutes was spent counseling and coordinating the care for this patient.  Greater than 50% of the time was spent in direct face-to-face contact.

## 2023-10-05 NOTE — Procedures (Signed)
Melissa Fleming 09/09/1986 [redacted]w[redacted]d  Fetus A Non-Stress Test Interpretation for 10/05/23  Indication: IUGR and Chronic Hypertenstion  Fetal Heart Rate A Mode: External Baseline Rate (A): 125 bpm Variability: Moderate Accelerations: 15 x 15 Decelerations: None Multiple birth?: No  Uterine Activity Mode: Palpation, Toco Contraction Frequency (min): none Resting Tone Palpated: Relaxed  Interpretation (Fetal Testing) Nonstress Test Interpretation: Reactive Overall Impression: Reassuring for gestational age Comments: Dr. Parke Poisson reviewed tracing

## 2023-10-06 ENCOUNTER — Ambulatory Visit: Payer: Medicaid Other | Admitting: Family Medicine

## 2023-10-06 NOTE — Telephone Encounter (Signed)
Pt currently scheduled for visit with [facility] on 10/14/23.

## 2023-10-08 ENCOUNTER — Telehealth (HOSPITAL_COMMUNITY): Payer: Self-pay | Admitting: *Deleted

## 2023-10-08 ENCOUNTER — Ambulatory Visit (INDEPENDENT_AMBULATORY_CARE_PROVIDER_SITE_OTHER): Payer: Medicaid Other | Admitting: Obstetrics and Gynecology

## 2023-10-08 ENCOUNTER — Ambulatory Visit: Payer: Medicaid Other

## 2023-10-08 ENCOUNTER — Other Ambulatory Visit: Payer: Self-pay

## 2023-10-08 ENCOUNTER — Other Ambulatory Visit: Payer: Medicaid Other

## 2023-10-08 ENCOUNTER — Ambulatory Visit: Payer: Medicaid Other | Attending: Maternal & Fetal Medicine | Admitting: *Deleted

## 2023-10-08 VITALS — BP 144/105 | HR 105 | Wt 198.6 lb

## 2023-10-08 VITALS — BP 153/107

## 2023-10-08 DIAGNOSIS — O365990 Maternal care for other known or suspected poor fetal growth, unspecified trimester, not applicable or unspecified: Secondary | ICD-10-CM | POA: Diagnosis not present

## 2023-10-08 DIAGNOSIS — O99820 Streptococcus B carrier state complicating pregnancy: Secondary | ICD-10-CM

## 2023-10-08 DIAGNOSIS — O365930 Maternal care for other known or suspected poor fetal growth, third trimester, not applicable or unspecified: Secondary | ICD-10-CM | POA: Insufficient documentation

## 2023-10-08 DIAGNOSIS — Z3A35 35 weeks gestation of pregnancy: Secondary | ICD-10-CM | POA: Insufficient documentation

## 2023-10-08 DIAGNOSIS — O10919 Unspecified pre-existing hypertension complicating pregnancy, unspecified trimester: Secondary | ICD-10-CM

## 2023-10-08 DIAGNOSIS — O0993 Supervision of high risk pregnancy, unspecified, third trimester: Secondary | ICD-10-CM

## 2023-10-08 DIAGNOSIS — O24419 Gestational diabetes mellitus in pregnancy, unspecified control: Secondary | ICD-10-CM

## 2023-10-08 DIAGNOSIS — O10013 Pre-existing essential hypertension complicating pregnancy, third trimester: Secondary | ICD-10-CM

## 2023-10-08 MED ORDER — NIFEDIPINE ER 60 MG PO TB24: 60.0000 mg | ORAL_TABLET | Freq: Every day | ORAL | 2 refills | Status: DC

## 2023-10-08 NOTE — Progress Notes (Signed)
 Pt reports nasal congestion for a week and vomiting all night that started last night. Says she cannot keep anything down and thought it was her medication.

## 2023-10-08 NOTE — Procedures (Signed)
 Melissa Fleming 1986-03-04 [redacted]w[redacted]d  Fetus A Non-Stress Test Interpretation for 10/08/23  NST only  Indication: IUGR  Fetal Heart Rate A Mode: External Baseline Rate (A): 135 bpm Variability: Moderate Accelerations: 15 x 15 Decelerations: None Multiple birth?: No  Uterine Activity Mode: Palpation, Toco Contraction Frequency (min): 1 uc Contraction Duration (sec): 50 Contraction Quality: Mild Resting Tone Palpated: Relaxed Resting Time: Adequate  Interpretation (Fetal Testing) Nonstress Test Interpretation: Reactive Overall Impression: Reassuring for gestational age Comments: Dr. Arna reviewed tracing

## 2023-10-08 NOTE — Telephone Encounter (Signed)
 Preadmission screen

## 2023-10-08 NOTE — Progress Notes (Signed)
Pt did not bring glucose log

## 2023-10-08 NOTE — Progress Notes (Signed)
   PRENATAL VISIT NOTE  Subjective:  Melissa Fleming is a 38 y.o. H4E9869 at [redacted]w[redacted]d being seen today for ongoing prenatal care.  She is currently monitored for the following issues for this high-risk pregnancy and has Chronic hypertension affecting pregnancy; Supervision of high-risk pregnancy; BMI 36.0-36.9,adult; Abnormal chromosomal and genetic finding on antenatal screening mother; Carpal tunnel syndrome during pregnancy; Oral hypoglycemic controlled White classification A2 gestational diabetes mellitus (GDM); Fetal growth restriction antepartum; PVC (premature ventricular contraction); Fetal heart rate decelerations affecting management of mother; and GBS (group B Streptococcus carrier), +RV culture, currently pregnant on their problem list.  Patient reports  past two to three days has been sick with congestion and cough and throwing up after coughing spells, reports throwing up medicine  .  Contractions: Irritability. Vag. Bleeding: None.  Movement: Present. Denies leaking of fluid.   The following portions of the patient's history were reviewed and updated as appropriate: allergies, current medications, past family history, past medical history, past social history, past surgical history and problem list.   Objective:   Vitals:   10/08/23 1616  BP: (!) 154/107  Pulse: (!) 105  Weight: 198 lb 9.6 oz (90.1 kg)    Fetal Status: Fetal Heart Rate (bpm): 149   Movement: Present     General:  Alert, oriented and cooperative. Patient is in no acute distress.  Skin: Skin is warm and dry. No rash noted.   Cardiovascular: Normal heart rate noted  Respiratory: Normal respiratory effort, no problems with respiration noted  Abdomen: Soft, gravid, appropriate for gestational age.  Pain/Pressure: Present     Pelvic: Cervical exam deferred        Extremities: Normal range of motion.  Edema: None  Mental Status: Normal mood and affect. Normal behavior. Normal judgment and thought content.    Assessment and Plan:  Pregnancy: G5P0130 at [redacted]w[redacted]d 1. Supervision of high risk pregnancy in third trimester (Primary) Reports doing well overall, ready for delivery.  2. Chronic hypertension affecting pregnancy Reports normal blood pressure at home when taking medication is normal However, bp elevated previously  Increase nifedipine  to 60 mg nightly. Follow up precautions given IOL 1/5  3. Fetal growth restriction antepartum IOL scheduled 1/5 per MFM  12/30 elevated dopplers, no absent or reversed flow, BPP 10/10, EFW 3% Feeling regular movement, precautions given when to go in sooner  4. Gestational diabetes mellitus (GDM) in third trimester, gestational diabetes method of control unspecified On glyburide  in the AM, didn't bring log, reports fasting and PP within range since taking insulin , continue monitoring prior to induction   5. [redacted] weeks gestation of pregnancy Already had swabs collected  Symptomatic tx discussed for current illness   6. GBS (group B Streptococcus carrier), +RV culture, currently pregnant Tx in labor   Preterm labor symptoms and general obstetric precautions including but not limited to vaginal bleeding, contractions, leaking of fluid and fetal movement were reviewed in detail with the patient. Please refer to After Visit Summary for other counseling recommendations.   Return postpartum  Future Appointments  Date Time Provider Department Center  10/11/2023  6:45 AM [facility] SCHED ROOM [facility] None  10/14/2023  9:40 AM Tobb, Kardie, DO [facility] None    Nidia Daring, FNP

## 2023-10-09 ENCOUNTER — Telehealth (HOSPITAL_COMMUNITY): Payer: Self-pay | Admitting: *Deleted

## 2023-10-09 ENCOUNTER — Ambulatory Visit: Payer: Medicaid Other

## 2023-10-09 NOTE — Telephone Encounter (Signed)
 Preadmission screen

## 2023-10-11 ENCOUNTER — Encounter (HOSPITAL_COMMUNITY): Payer: Self-pay | Admitting: Family Medicine

## 2023-10-11 ENCOUNTER — Inpatient Hospital Stay (HOSPITAL_COMMUNITY)
Admission: RE | Admit: 2023-10-11 | Discharge: 2023-10-17 | DRG: 787 | Disposition: A | Discharge status: Home / Self Care | Payer: Medicaid Other | Attending: Family Medicine | Admitting: Family Medicine

## 2023-10-11 ENCOUNTER — Inpatient Hospital Stay (HOSPITAL_COMMUNITY): Payer: Medicaid Other

## 2023-10-11 ENCOUNTER — Other Ambulatory Visit: Payer: Self-pay

## 2023-10-11 DIAGNOSIS — O99824 Streptococcus B carrier state complicating childbirth: Secondary | ICD-10-CM | POA: Diagnosis present

## 2023-10-11 DIAGNOSIS — Z833 Family history of diabetes mellitus: Secondary | ICD-10-CM

## 2023-10-11 DIAGNOSIS — O365990 Maternal care for other known or suspected poor fetal growth, unspecified trimester, not applicable or unspecified: Secondary | ICD-10-CM | POA: Diagnosis present

## 2023-10-11 DIAGNOSIS — O0990 Supervision of high risk pregnancy, unspecified, unspecified trimester: Secondary | ICD-10-CM

## 2023-10-11 DIAGNOSIS — Z8249 Family history of ischemic heart disease and other diseases of the circulatory system: Secondary | ICD-10-CM

## 2023-10-11 DIAGNOSIS — O0993 Supervision of high risk pregnancy, unspecified, third trimester: Secondary | ICD-10-CM

## 2023-10-11 DIAGNOSIS — O24419 Gestational diabetes mellitus in pregnancy, unspecified control: Secondary | ICD-10-CM

## 2023-10-11 DIAGNOSIS — O99820 Streptococcus B carrier state complicating pregnancy: Secondary | ICD-10-CM

## 2023-10-11 DIAGNOSIS — O10913 Unspecified pre-existing hypertension complicating pregnancy, third trimester: Secondary | ICD-10-CM | POA: Diagnosis not present

## 2023-10-11 DIAGNOSIS — O34211 Maternal care for low transverse scar from previous cesarean delivery: Secondary | ICD-10-CM | POA: Diagnosis present

## 2023-10-11 DIAGNOSIS — O285 Abnormal chromosomal and genetic finding on antenatal screening of mother: Secondary | ICD-10-CM | POA: Diagnosis present

## 2023-10-11 DIAGNOSIS — O368390 Maternal care for abnormalities of the fetal heart rate or rhythm, unspecified trimester, not applicable or unspecified: Secondary | ICD-10-CM | POA: Diagnosis present

## 2023-10-11 DIAGNOSIS — O365930 Maternal care for other known or suspected poor fetal growth, third trimester, not applicable or unspecified: Principal | ICD-10-CM | POA: Diagnosis present

## 2023-10-11 DIAGNOSIS — O24415 Gestational diabetes mellitus in pregnancy, controlled by oral hypoglycemic drugs: Secondary | ICD-10-CM | POA: Diagnosis present

## 2023-10-11 DIAGNOSIS — O1092 Unspecified pre-existing hypertension complicating childbirth: Secondary | ICD-10-CM | POA: Diagnosis not present

## 2023-10-11 DIAGNOSIS — Z3A36 36 weeks gestation of pregnancy: Secondary | ICD-10-CM | POA: Diagnosis not present

## 2023-10-11 DIAGNOSIS — O10919 Unspecified pre-existing hypertension complicating pregnancy, unspecified trimester: Principal | ICD-10-CM | POA: Diagnosis present

## 2023-10-11 DIAGNOSIS — Z885 Allergy status to narcotic agent status: Secondary | ICD-10-CM

## 2023-10-11 DIAGNOSIS — Z79899 Other long term (current) drug therapy: Secondary | ICD-10-CM | POA: Diagnosis not present

## 2023-10-11 DIAGNOSIS — I493 Ventricular premature depolarization: Secondary | ICD-10-CM | POA: Diagnosis present

## 2023-10-11 DIAGNOSIS — Z7982 Long term (current) use of aspirin: Secondary | ICD-10-CM

## 2023-10-11 DIAGNOSIS — O24425 Gestational diabetes mellitus in childbirth, controlled by oral hypoglycemic drugs: Secondary | ICD-10-CM | POA: Diagnosis present

## 2023-10-11 DIAGNOSIS — O9081 Anemia of the puerperium: Secondary | ICD-10-CM | POA: Diagnosis not present

## 2023-10-11 DIAGNOSIS — D62 Acute posthemorrhagic anemia: Secondary | ICD-10-CM | POA: Diagnosis not present

## 2023-10-11 DIAGNOSIS — G5600 Carpal tunnel syndrome, unspecified upper limb: Secondary | ICD-10-CM | POA: Diagnosis present

## 2023-10-11 DIAGNOSIS — Z98891 History of uterine scar from previous surgery: Principal | ICD-10-CM

## 2023-10-11 DIAGNOSIS — O26899 Other specified pregnancy related conditions, unspecified trimester: Secondary | ICD-10-CM | POA: Diagnosis present

## 2023-10-11 DIAGNOSIS — Z6836 Body mass index (BMI) 36.0-36.9, adult: Secondary | ICD-10-CM

## 2023-10-11 DIAGNOSIS — O1012 Pre-existing hypertensive heart disease complicating childbirth: Secondary | ICD-10-CM | POA: Diagnosis not present

## 2023-10-11 LAB — COMPREHENSIVE METABOLIC PANEL
ALT: 14 U/L (ref 0–44)
AST: 21 U/L (ref 15–41)
Albumin: 2.7 g/dL — ABNORMAL LOW (ref 3.5–5.0)
Alkaline Phosphatase: 111 U/L (ref 38–126)
Anion gap: 10 (ref 5–15)
BUN: 16 mg/dL (ref 6–20)
CO2: 17 mmol/L — ABNORMAL LOW (ref 22–32)
Calcium: 9.1 mg/dL (ref 8.9–10.3)
Chloride: 108 mmol/L (ref 98–111)
Creatinine, Ser: 0.68 mg/dL (ref 0.44–1.00)
GFR, Estimated: >60 mL/min (ref >60)
Glucose, Bld: 81 mg/dL (ref 70–99)
Potassium: 3.7 mmol/L (ref 3.5–5.1)
Sodium: 135 mmol/L (ref 135–145)
Total Bilirubin: 0.3 mg/dL (ref 0.0–1.2)
Total Protein: 6.5 g/dL (ref 6.5–8.1)

## 2023-10-11 LAB — RPR: RPR Ser Ql: NONREACTIVE

## 2023-10-11 LAB — GLUCOSE, CAPILLARY
Glucose-Capillary: 66 mg/dL — ABNORMAL LOW (ref 70–99)
Glucose-Capillary: 87 mg/dL (ref 70–99)
Glucose-Capillary: 67 mg/dL — ABNORMAL LOW (ref 70–99)
Glucose-Capillary: 70 mg/dL (ref 70–99)
Glucose-Capillary: 120 mg/dL — ABNORMAL HIGH (ref 70–99)
Glucose-Capillary: 78 mg/dL (ref 70–99)

## 2023-10-11 LAB — CBC
HCT: 39.6 % (ref 36.0–46.0)
Hemoglobin: 13.5 g/dL (ref 12.0–15.0)
MCH: 29.6 pg (ref 26.0–34.0)
MCHC: 34.1 g/dL (ref 30.0–36.0)
MCV: 86.8 fL (ref 80.0–100.0)
Platelets: 284 10*3/uL (ref 150–400)
RBC: 4.56 MIL/uL (ref 3.87–5.11)
RDW: 13.9 % (ref 11.5–15.5)
WBC: 4.9 10*3/uL (ref 4.0–10.5)
nRBC: 0 % (ref 0.0–0.2)

## 2023-10-11 LAB — TYPE AND SCREEN
ABO/RH(D): O POS
Antibody Screen: NEGATIVE

## 2023-10-11 LAB — PROTEIN / CREATININE RATIO, URINE
Creatinine, Urine: 111 mg/dL
Protein Creatinine Ratio: 0.13 mg/mg{creat} (ref 0.00–0.15)
Total Protein, Urine: 14 mg/dL

## 2023-10-11 MED ORDER — ONDANSETRON HCL 4 MG/2ML IJ SOLN: 4.0000 mg | Freq: Four times a day (QID) | INTRAMUSCULAR | Status: DC | PRN

## 2023-10-11 MED ORDER — EPHEDRINE 5 MG/ML INJ: 10.0000 mg | INTRAVENOUS | Status: DC | PRN

## 2023-10-11 MED ORDER — FENTANYL-BUPIVACAINE-NACL 0.5-0.125-0.9 MG/250ML-% EP SOLN
12.0000 mL/h | EPIDURAL | Status: DC | PRN
  Filled 2023-10-11: qty 250

## 2023-10-11 MED ORDER — FLUTICASONE PROPIONATE 50 MCG/ACT NA SUSP
2.0000 | Freq: Every day | NASAL | Status: DC
  Administered 2023-10-11: 2 via NASAL
  Filled 2023-10-11: qty 16

## 2023-10-11 MED ORDER — SOD CITRATE-CITRIC ACID 500-334 MG/5ML PO SOLN
30.0000 mL | ORAL | Status: DC | PRN
  Filled 2023-10-11: qty 30

## 2023-10-11 MED ORDER — DIPHENHYDRAMINE HCL 50 MG/ML IJ SOLN: 12.5000 mg | INTRAMUSCULAR | Status: DC | PRN

## 2023-10-11 MED ORDER — LACTATED RINGERS IV SOLN
500.0000 mL | INTRAVENOUS | Status: DC | PRN
  Administered 2023-10-11 (×2): 500 mL via INTRAVENOUS

## 2023-10-11 MED ORDER — OXYTOCIN-SODIUM CHLORIDE 30-0.9 UT/500ML-% IV SOLN: 1.0000 m[IU]/min | INTRAVENOUS | Status: DC

## 2023-10-11 MED ORDER — ACETAMINOPHEN 325 MG PO TABS: 650.0000 mg | ORAL_TABLET | ORAL | Status: DC | PRN

## 2023-10-11 MED ORDER — LABETALOL HCL 200 MG PO TABS: 400.0000 mg | ORAL_TABLET | Freq: Three times a day (TID) | ORAL | Status: DC

## 2023-10-11 MED ORDER — NIFEDIPINE ER OSMOTIC RELEASE 60 MG PO TB24
60.0000 mg | ORAL_TABLET | Freq: Every day | ORAL | Status: DC
  Administered 2023-10-11: 60 mg via ORAL
  Filled 2023-10-11: qty 2

## 2023-10-11 MED ORDER — PENICILLIN G POT IN DEXTROSE 60000 UNIT/ML IV SOLN
3.0000 10*6.[IU] | INTRAVENOUS | Status: DC
  Administered 2023-10-11 – 2023-10-12 (×5): 3 10*6.[IU] via INTRAVENOUS
  Filled 2023-10-11 (×6): qty 50

## 2023-10-11 MED ORDER — LACTATED RINGERS AMNIOINFUSION: INTRAVENOUS | Status: DC

## 2023-10-11 MED ORDER — PHENYLEPHRINE 80 MCG/ML (10ML) SYRINGE FOR IV PUSH (FOR BLOOD PRESSURE SUPPORT): 80.0000 ug | PREFILLED_SYRINGE | INTRAVENOUS | Status: DC | PRN

## 2023-10-11 MED ORDER — LABETALOL HCL 200 MG PO TABS
400.0000 mg | ORAL_TABLET | Freq: Three times a day (TID) | ORAL | Status: DC
  Administered 2023-10-11 – 2023-10-12 (×3): 400 mg via ORAL
  Filled 2023-10-11 (×3): qty 2

## 2023-10-11 MED ORDER — OXYTOCIN BOLUS FROM INFUSION: 333.0000 mL | Freq: Once | INTRAVENOUS | Status: DC

## 2023-10-11 MED ORDER — LACTATED RINGERS IV SOLN
500.0000 mL | Freq: Once | INTRAVENOUS | Status: AC
  Administered 2023-10-12: 500 mL via INTRAVENOUS

## 2023-10-11 MED ORDER — FENTANYL CITRATE (PF) 100 MCG/2ML IJ SOLN
INTRAMUSCULAR | Status: AC
  Filled 2023-10-11: qty 2

## 2023-10-11 MED ORDER — OXYTOCIN-SODIUM CHLORIDE 30-0.9 UT/500ML-% IV SOLN
1.0000 m[IU]/min | INTRAVENOUS | Status: DC
  Administered 2023-10-11 (×2): 2 m[IU]/min via INTRAVENOUS
  Filled 2023-10-11: qty 500

## 2023-10-11 MED ORDER — OXYTOCIN-SODIUM CHLORIDE 30-0.9 UT/500ML-% IV SOLN: 2.5000 [IU]/h | INTRAVENOUS | Status: DC

## 2023-10-11 MED ORDER — LIDOCAINE HCL (PF) 1 % IJ SOLN: 30.0000 mL | INTRAMUSCULAR | Status: DC | PRN

## 2023-10-11 MED ORDER — SODIUM CHLORIDE 0.9 % IV SOLN
5.0000 10*6.[IU] | Freq: Once | INTRAVENOUS | Status: AC
  Administered 2023-10-11: 5 10*6.[IU] via INTRAVENOUS
  Filled 2023-10-11: qty 5

## 2023-10-11 MED ORDER — TERBUTALINE SULFATE 1 MG/ML IJ SOLN
0.2500 mg | Freq: Once | INTRAMUSCULAR | Status: AC | PRN
  Administered 2023-10-11: 0.25 mg via SUBCUTANEOUS
  Filled 2023-10-11: qty 1

## 2023-10-11 MED ORDER — FENTANYL CITRATE (PF) 100 MCG/2ML IJ SOLN
50.0000 ug | INTRAMUSCULAR | Status: DC | PRN
  Administered 2023-10-11 (×3): 50 ug via INTRAVENOUS
  Administered 2023-10-12: 100 ug via INTRAVENOUS
  Filled 2023-10-11 (×3): qty 2

## 2023-10-11 MED ORDER — LACTATED RINGERS IV SOLN: INTRAVENOUS | Status: DC

## 2023-10-11 NOTE — H&P (Signed)
 OBSTETRIC ADMISSION HISTORY AND PHYSICAL  Melissa Fleming is a 38 y.o. female G38P0130 with IUP at [redacted]w[redacted]d by 7 weeks US  presenting for IOL for FGR, A2 GDM, & CHTN. She reports +FMs, No LOF, no VB, no blurry vision, headaches or peripheral edema, and RUQ pain.  She plans on breast and and bottle feeding. She request POPs for birth control. She received her prenatal care at [facility]   Dating: By 7 weeks US  --->  Estimated Date of Delivery: 11/06/23  Sono:   @[redacted]w[redacted]d , CWD, normal anatomy, cephalic presentation, posterior placental lie, 2062 gm 4 lb 9 oz 3.3 %   Prenatal History/Complications:  - CHTN on 400mg  Labetalol  TID & Procardia  60 mg daily - BMI 36 - FGR <5% - A2 GDM on Glyburide  5 mg   Past Medical History: Past Medical History:  Diagnosis Date   Gestational diabetes    Hypertension    PCOS (polycystic ovarian syndrome)    PCOS (polycystic ovarian syndrome) 01/25/2020   Sees Dr Derald Massing, on metformin      Prediabetes     Past Surgical History: Past Surgical History:  Procedure Laterality Date   WISDOM TOOTH EXTRACTION      Obstetrical History: OB History     Gravida  5   Para  1   Term      Preterm  1   AB  3   Living         SAB  3   IAB      Ectopic      Multiple      Live Births  0        Obstetric Comments  -2nd miscarriage- she was around 5mos.  Had delivery at hospital, did not know she was pregnant         Social History Social History   Socioeconomic History   Marital status: Single    Spouse name: Not on file   Number of children: Not on file   Years of education: Not on file   Highest education level: Associate degree: academic program  Occupational History   Not on file  Tobacco Use   Smoking status: Never   Smokeless tobacco: Never  Vaping Use   Vaping status: Never Used  Substance and Sexual Activity   Alcohol use: Not Currently    Comment: occasionally   Drug use: No   Sexual activity: Not Currently    Birth  control/protection: None, Other-see comments    Comment: pull out  Other Topics Concern   Not on file  Social History Narrative   Not on file   Social Drivers of Health   Financial Resource Strain: Low Risk  (05/25/2023)   Overall Financial Resource Strain (CARDIA)    Difficulty of Paying Living Expenses: Not very hard  Food Insecurity: No Food Insecurity (10/11/2023)   Hunger Vital Sign    Worried About Running Out of Food in the Last Year: Never true    Ran Out of Food in the Last Year: Never true  Transportation Needs: No Transportation Needs (10/11/2023)   PRAPARE - Administrator, Civil Service (Medical): No    Lack of Transportation (Non-Medical): No  Physical Activity: Insufficiently Active (05/25/2023)   Exercise Vital Sign    Days of Exercise per Week: 3 days    Minutes of Exercise per Session: 30 min  Stress: No Stress Concern Present (05/25/2023)   [employer] of Occupational Health - Occupational Stress Questionnaire    Feeling  of Stress : Only a little  Recent Concern: Stress - Stress Concern Present (04/30/2023)   [employer] of Occupational Health - Occupational Stress Questionnaire    Feeling of Stress : To some extent  Social Connections: Moderately Integrated (10/11/2023)   Social Connection and Isolation Panel [NHANES]    Frequency of Communication with Friends and Family: More than three times a week    Frequency of Social Gatherings with Friends and Family: More than three times a week    Attends Religious Services: More than 4 times per year    Active Member of [employer] or Organizations: No    Attends Engineer, Structural: Never    Marital Status: Living with partner    Family History: Family History  Problem Relation Age of Onset   Diabetes Mother    Hypertension Mother    Mental illness Sister    Cancer Maternal Grandmother    Hypertension Maternal Grandmother    Cancer Maternal Grandfather        Bone   Diabetes Paternal  Grandmother     Allergies: Allergies  Allergen Reactions   Morphine  Hives    Medications Prior to Admission  Medication Sig Dispense Refill Last Dose/Taking   aspirin  EC 81 MG tablet Take 2 tablets (162 mg total) by mouth daily. 60 tablet 6 10/10/2023 Morning   glyBURIDE  (DIABETA ) 5 MG tablet Take 1 tablet (5 mg total) by mouth daily with breakfast. 30 tablet 2 10/10/2023 Morning   labetalol  (NORMODYNE ) 200 MG tablet Take 2 tablets (400 mg total) by mouth 3 (three) times daily. 180 tablet 1 10/10/2023 Morning   NIFEdipine  (ADALAT  CC) 60 MG 24 hr tablet Take 1 tablet (60 mg total) by mouth daily. 30 tablet 2 10/10/2023 Morning   Prenatal Vit-Fe Fumarate-FA (PRENATAL MULTIVITAMIN) TABS tablet Take 1 tablet by mouth daily at 12 noon.   10/10/2023 Morning   Accu-Chek Softclix Lancets lancets Use as instructed QID 100 each 12    Blood Glucose Monitoring Suppl (ACCU-CHEK GUIDE) w/Device KIT USE AS DIRECTED ONCE DAILY TO CHECK BLOOD SUGAR      glucose blood (ACCU-CHEK GUIDE) test strip Use as instructed QID 100 each 12      Review of Systems   All systems reviewed and negative except as stated in HPI  Blood pressure (!) 144/85, pulse 77, temperature 98 F (36.7 C), temperature source Oral, resp. rate 18, height 5' (1.524 m), weight 90.4 kg. General appearance: alert, cooperative, appears stated age, and no distress Lungs: clear to auscultation bilaterally Heart: regular rate and rhythm Abdomen: soft, non-tender; bowel sounds normal Pelvic: adquate, unproven Extremities: Homans sign is negative, no sign of DVT DTR's 2+ Presentation: cephalic Fetal monitoringBaseline: 130 bpm, Variability: Good {> 6 bpm), Accelerations: Reactive, and Decelerations: Absent Uterine activity rare  Dilation: Closed Effacement (%): Thick Station: -3 Exam by:: Pascual   Prenatal labs: ABO, Rh: --/--/O POS (01/05 9287) Antibody: NEG (01/05 9287) Rubella: 4.22 (07/25 1619) RPR: Non Reactive (11/08 0843)   HBsAg: Negative (07/25 1619)  HIV: Non Reactive (11/08 0843)  GBS: Positive/-- (12/26 0000)  2 hr Glucola abnormal Genetic screening  IT ^r/f T21, LR NIPS, normal u/s, declined MFM referral Anatomy US  normal female  Prenatal Transfer Tool  Maternal Diabetes: Yes:  Diabetes Type:  Insulin /Medication controlled Genetic Screening: Abnormal:  Results: Other: IT ^r/f T21, LR NIPS, normal u/s, declined MFM referral Maternal Ultrasounds/Referrals: Normal Fetal Ultrasounds or other Referrals:  None Maternal Substance Abuse:  No Significant Maternal Medications:  Meds include: Other: Glyburide  Significant Maternal Lab Results:  Group B Strep positive Number of Prenatal Visits:greater than 3 verified prenatal visits Other Comments:  None  Results for orders placed or performed during the hospital encounter of 10/11/23 (from the past 24 hours)  Protein / creatinine ratio, urine   Collection Time: 10/11/23  6:29 AM  Result Value Ref Range   Creatinine, Urine 111 mg/dL   Total Protein, Urine 14 mg/dL   Protein Creatinine Ratio 0.13 0.00 - 0.15 mg/mg[Cre]  CBC   Collection Time: 10/11/23  7:12 AM  Result Value Ref Range   WBC 4.9 4.0 - 10.5 K/uL   RBC 4.56 3.87 - 5.11 MIL/uL   Hemoglobin 13.5 12.0 - 15.0 g/dL   HCT 60.3 63.9 - 53.9 %   MCV 86.8 80.0 - 100.0 fL   MCH 29.6 26.0 - 34.0 pg   MCHC 34.1 30.0 - 36.0 g/dL   RDW 86.0 88.4 - 84.4 %   Platelets 284 150 - 400 K/uL   nRBC 0.0 0.0 - 0.2 %  Comprehensive metabolic panel   Collection Time: 10/11/23  7:12 AM  Result Value Ref Range   Sodium 135 135 - 145 mmol/L   Potassium 3.7 3.5 - 5.1 mmol/L   Chloride 108 98 - 111 mmol/L   CO2 17 (L) 22 - 32 mmol/L   Glucose, Bld 81 70 - 99 mg/dL   BUN 16 6 - 20 mg/dL   Creatinine, Ser 9.31 0.44 - 1.00 mg/dL   Calcium  9.1 8.9 - 10.3 mg/dL   Total Protein 6.5 6.5 - 8.1 g/dL   Albumin 2.7 (L) 3.5 - 5.0 g/dL   AST 21 15 - 41 U/L   ALT 14 0 - 44 U/L   Alkaline Phosphatase 111 38 - 126 U/L    Total Bilirubin 0.3 0.0 - 1.2 mg/dL   GFR, Estimated >39 >39 mL/min   Anion gap 10 5 - 15  Type and screen   Collection Time: 10/11/23  7:12 AM  Result Value Ref Range   ABO/RH(D) O POS    Antibody Screen NEG    Sample Expiration      10/14/2023,2359 Performed at [facility], [address], [place], [place] [address]   Glucose, capillary   Collection Time: 10/11/23  7:38 AM  Result Value Ref Range   Glucose-Capillary 66 (L) 70 - 99 mg/dL  Glucose, capillary   Collection Time: 10/11/23  8:07 AM  Result Value Ref Range   Glucose-Capillary 87 70 - 99 mg/dL    Patient Active Problem List   Diagnosis Date Noted   GBS (group B Streptococcus carrier), +RV culture, currently pregnant 10/05/2023   Fetal heart rate decelerations affecting management of mother 10/01/2023   PVC (premature ventricular contraction) 09/24/2023   Fetal growth restriction antepartum 09/11/2023   Oral hypoglycemic controlled White classification A2 gestational diabetes mellitus (GDM) 08/31/2023   Carpal tunnel syndrome during pregnancy 07/28/2023   Abnormal chromosomal and genetic finding on antenatal screening mother 06/15/2023   BMI 36.0-36.9,adult 04/30/2023   Supervision of high-risk pregnancy 04/29/2023   Chronic hypertension affecting pregnancy 01/25/2020    Assessment/Plan:  Melissa Fleming is a 38 y.o. G5P0130 at [redacted]w[redacted]d here for IOL for CHTN, FGR 4.4%, BMI 36, A2GDM  #Labor:FB placed with speculum, 40cc saline. Start low dose pitocin . Avoid cytotec given spontaneous fetal decels.  #Pain: IV Fentanyl  for FB placement. Desires epidural later #FWB: Cat I #ID:  GBS positive; PCN #MOF: Breast and bottle #MOC:  POPs #Circ:  Yes  Mardy Shropshire, MD  10/11/2023, 10:24 AM

## 2023-10-11 NOTE — Progress Notes (Signed)
 LABOR PROGRESS NOTE  Patient Name: Melissa Fleming, female   DOB: 09-24-1986, 38 y.o.  MRN: [id number]  RN having trouble picking up contractions with position changes. Also unable to fully assess decelerations, possibly some lates. Pit stopped. Discussed risks/benefits of IUPC placement, and verbal consent obtained. Placed without difficulty. Mom and babe tolerated well. Some cervical change since last check. Restart pit. Cat II tracing with 140/mod var/+accels/variables.  [name], MD FMOB Fellow, Faculty practice [facility], Center for [facility] 10/11/23  11:24 PM

## 2023-10-11 NOTE — Progress Notes (Signed)
 LABOR PROGRESS NOTE  Patient Name: Melissa Fleming, female   DOB: 08/04/1986, 38 y.o.  MRN: [id number]  H4E9869 at [redacted]w[redacted]d admitted for IOL 2/2 severe IUGR, cHTN, A2GDM.  S: Patient expressing concern about fetal tolerance  O:  BP 115/82   Pulse 96   Temp 98.6 F (37 C) (Oral)   Resp 18   Ht 5' (1.524 m)   Wt 90.4 kg   BMI 38.90 kg/m  EFM:135 bpm/Moderate variability/ 15x15 accels/ Variable decels CAT: 2 Toco: irregular   CVE: Dilation: 3 Effacement (%): 50 Cervical Position: Posterior Station: -3 Presentation: Vertex Exam by:: Nicholaus, MD   A&P:   #Labor: Long discussion with patient about known comorbidities of pregnancy which makes it likely that baby will have some decelerations/variables during the labor process. However excellent variability and accels are reassuring. Will continue to titrate pit slowly 1x1. Risks/benefits of AROM discussed with patient, and verbal consent obtained. Felt/heard bag pop; no fluid. Mom and babe tolerated well. #Pain: Per pt request #FWB: CAT 2 #GBS positive > PCN #Anticipate vaginal delivery  #cHTN: mild range pressures earlier; no normotensive. Continue home regimen #A2GDM: Q4 sugars active/Q2 latent. To goal currently  Melissa Fleming Nicholaus, MD FMOB Fellow, Faculty practice [facility], Center for [facility] 10/11/23  8:42 PM

## 2023-10-11 NOTE — Progress Notes (Addendum)
 Labor Progress Note Melissa Fleming is a 38 y.o. H4E9869 at 104w2d presented for IOL due to FGR, cHTN S: Patient wishing to make progress. Family at bedside.   O:  BP (!) 140/90   Pulse 84   Temp 98.6 F (37 C) (Oral)   Resp 18   Ht 5' (1.524 m)   Wt 90.4 kg   BMI 38.90 kg/m  EFM: Baseline 135/Mod variability/Accels present/Variable decels, maintaining variability after recovery to baseline Toco: Uterine irritability CVE: Dilation: 2 Effacement (%): 50 Cervical Position: Middle Station: Ballotable Presentation: Vertex Exam by:: Dr. Loyola   A&P: 38 y.o. H4E9869 [redacted]w[redacted]d  #Labor: Early latent labor. Cervix consistency softer. Baby head ballotable, not amenable to AROM. Will restart Pitocin  to hopefully engage fetal head better for AROM, will have to monitor closely due to prior deep recurrent variables on Pitocin .  #Pain: Planning on epidural. IV meds per patient request #FWB: Cat II tracing #GBS positive -- PCN  #cHTN : BP mild range, asymptomatic. On Labetalol  400 TID nifedipine  60mg  daily #A2GDM: Mild occasional hypoglycemia to high 60s in setting of decreased PO intake  Melissa KATHEE Mannheim, MD 6:33 PM  GME ATTESTATION:  Evaluation and management procedures were performed by the [facility] Resident under my supervision. I was immediately available for direct supervision, assistance and direction throughout this encounter.  I also confirm that I have verified the information documented in the resident's note, and that I have also personally reperformed the pertinent components of the physical exam and all of the medical decision making activities.  I have also made any necessary editorial changes.  Mardy Loyola, MD OB Fellow, Faculty Practice [facility], Center for [facility] 10/11/2023 7:15 PM

## 2023-10-11 NOTE — Progress Notes (Signed)
 Hypoglycemic Event  CBG: 66  Treatment: 4 oz juice/soda  Symptoms: None  Follow-up CBG: 0807     CBG Result:87   Possible Reasons for Event: Inadequate meal intake  Comments/MD notified:    Tobe Sos

## 2023-10-11 NOTE — Progress Notes (Signed)
 Presented to the room for recurrent deep variable decels Mom comfortable on her side, mild/moderate cramping  Blood pressure 135/74, pulse 73, temperature (!) 97.5 F (36.4 C), temperature source Axillary, resp. rate 18, height 5' (1.524 m), weight 90.4 kg.  EFM: baseline 150, accels, no decels, moderate variability TOCO: irritability  Dilation: 4 Effacement (%): 80 Station: -3 Presentation: Vertex Exam by:: Loyola, MD  FB removed.  FHT improved with hands and knees, d/c of pitocin  and 0.25 terbutaline   Will give baby and mom an hour or two to recover and then reassess for AROM vs restart pitocin  once out of the OR with another case.   Melissa Loyola, MD FMOB Fellow, Faculty practice [facility], Center for [facility]

## 2023-10-12 ENCOUNTER — Inpatient Hospital Stay (HOSPITAL_COMMUNITY): Payer: Medicaid Other | Admitting: Anesthesiology

## 2023-10-12 ENCOUNTER — Ambulatory Visit: Payer: Medicaid Other | Admitting: Family Medicine

## 2023-10-12 ENCOUNTER — Encounter (HOSPITAL_COMMUNITY)
Admission: RE | Disposition: A | Discharge status: Home / Self Care | Payer: Self-pay | Source: Home / Self Care | Attending: Family Medicine

## 2023-10-12 ENCOUNTER — Encounter (HOSPITAL_COMMUNITY): Payer: Self-pay | Admitting: Family Medicine

## 2023-10-12 ENCOUNTER — Other Ambulatory Visit: Payer: Self-pay

## 2023-10-12 DIAGNOSIS — O24415 Gestational diabetes mellitus in pregnancy, controlled by oral hypoglycemic drugs: Secondary | ICD-10-CM | POA: Diagnosis not present

## 2023-10-12 DIAGNOSIS — Z3A36 36 weeks gestation of pregnancy: Secondary | ICD-10-CM

## 2023-10-12 DIAGNOSIS — O1012 Pre-existing hypertensive heart disease complicating childbirth: Secondary | ICD-10-CM

## 2023-10-12 DIAGNOSIS — Z98891 History of uterine scar from previous surgery: Principal | ICD-10-CM

## 2023-10-12 DIAGNOSIS — O24425 Gestational diabetes mellitus in childbirth, controlled by oral hypoglycemic drugs: Secondary | ICD-10-CM

## 2023-10-12 DIAGNOSIS — O10919 Unspecified pre-existing hypertension complicating pregnancy, unspecified trimester: Secondary | ICD-10-CM | POA: Diagnosis not present

## 2023-10-12 DIAGNOSIS — O99820 Streptococcus B carrier state complicating pregnancy: Secondary | ICD-10-CM | POA: Diagnosis not present

## 2023-10-12 DIAGNOSIS — O10913 Unspecified pre-existing hypertension complicating pregnancy, third trimester: Secondary | ICD-10-CM | POA: Diagnosis not present

## 2023-10-12 DIAGNOSIS — O365930 Maternal care for other known or suspected poor fetal growth, third trimester, not applicable or unspecified: Secondary | ICD-10-CM | POA: Diagnosis not present

## 2023-10-12 LAB — CBC
HCT: 40.9 % (ref 36.0–46.0)
Hemoglobin: 12.5 g/dL (ref 12.0–15.0)
MCH: 29.6 pg (ref 26.0–34.0)
MCHC: 30.6 g/dL (ref 30.0–36.0)
MCV: 96.9 fL (ref 80.0–100.0)
Platelets: 360 10*3/uL (ref 150–400)
RBC: 4.22 MIL/uL (ref 3.87–5.11)
RDW: 14.3 % (ref 11.5–15.5)
WBC: 5.9 10*3/uL (ref 4.0–10.5)
nRBC: 0 % (ref 0.0–0.2)

## 2023-10-12 LAB — GLUCOSE, CAPILLARY
Glucose-Capillary: 59 mg/dL — ABNORMAL LOW (ref 70–99)
Glucose-Capillary: 69 mg/dL — ABNORMAL LOW (ref 70–99)
Glucose-Capillary: 80 mg/dL (ref 70–99)
Glucose-Capillary: 89 mg/dL (ref 70–99)
Glucose-Capillary: 95 mg/dL (ref 70–99)

## 2023-10-12 SURGERY — Surgical Case
Anesthesia: Epidural

## 2023-10-12 MED ORDER — BUPIVACAINE HCL (PF) 0.25 % IJ SOLN
INTRAMUSCULAR | Status: DC | PRN
  Administered 2023-10-12: 8 mL via EPIDURAL

## 2023-10-12 MED ORDER — SODIUM CHLORIDE 0.9 % IR SOLN
Status: DC | PRN
  Administered 2023-10-12: 1

## 2023-10-12 MED ORDER — TERBUTALINE SULFATE 1 MG/ML IJ SOLN
0.2500 mg | Freq: Once | INTRAMUSCULAR | Status: AC
  Administered 2023-10-12: 0.25 mg via SUBCUTANEOUS

## 2023-10-12 MED ORDER — NIFEDIPINE ER OSMOTIC RELEASE 30 MG PO TB24
30.0000 mg | ORAL_TABLET | Freq: Every day | ORAL | Status: DC
  Administered 2023-10-12 – 2023-10-15 (×4): 30 mg via ORAL
  Filled 2023-10-12 (×6): qty 1

## 2023-10-12 MED ORDER — SENNOSIDES-DOCUSATE SODIUM 8.6-50 MG PO TABS
2.0000 | ORAL_TABLET | Freq: Every day | ORAL | Status: DC
  Administered 2023-10-13 – 2023-10-14 (×2): 2 via ORAL
  Filled 2023-10-12 (×4): qty 2

## 2023-10-12 MED ORDER — LABETALOL HCL 5 MG/ML IV SOLN: 20.0000 mg | INTRAVENOUS | Status: DC | PRN

## 2023-10-12 MED ORDER — KETOROLAC TROMETHAMINE 30 MG/ML IJ SOLN
30.0000 mg | Freq: Four times a day (QID) | INTRAMUSCULAR | Status: DC
  Administered 2023-10-12 (×2): 30 mg via INTRAVENOUS
  Filled 2023-10-12 (×3): qty 1

## 2023-10-12 MED ORDER — ACETAMINOPHEN 10 MG/ML IV SOLN
INTRAVENOUS | Status: AC
  Filled 2023-10-12: qty 100

## 2023-10-12 MED ORDER — OXYCODONE HCL 5 MG PO TABS
5.0000 mg | ORAL_TABLET | ORAL | Status: DC | PRN
  Administered 2023-10-13 – 2023-10-14 (×3): 5 mg via ORAL
  Administered 2023-10-14 – 2023-10-17 (×7): 10 mg via ORAL
  Filled 2023-10-12 (×2): qty 2
  Filled 2023-10-12: qty 1
  Filled 2023-10-12 (×3): qty 2
  Filled 2023-10-12: qty 1
  Filled 2023-10-12 (×2): qty 2
  Filled 2023-10-12: qty 1

## 2023-10-12 MED ORDER — IBUPROFEN 800 MG PO TABS: 800.0000 mg | ORAL_TABLET | Freq: Three times a day (TID) | ORAL | Status: DC

## 2023-10-12 MED ORDER — DIPHENHYDRAMINE HCL 25 MG PO CAPS
25.0000 mg | ORAL_CAPSULE | Freq: Four times a day (QID) | ORAL | Status: DC | PRN
  Administered 2023-10-12 – 2023-10-13 (×2): 25 mg via ORAL
  Filled 2023-10-12 (×2): qty 1

## 2023-10-12 MED ORDER — MEASLES, MUMPS & RUBELLA VAC IJ SOLR: 0.5000 mL | Freq: Once | INTRAMUSCULAR | Status: DC

## 2023-10-12 MED ORDER — FENTANYL CITRATE (PF) 100 MCG/2ML IJ SOLN
INTRAMUSCULAR | Status: DC | PRN
  Administered 2023-10-12 (×2): 100 ug via EPIDURAL

## 2023-10-12 MED ORDER — CEFAZOLIN SODIUM-DEXTROSE 2-4 GM/100ML-% IV SOLN
2.0000 g | INTRAVENOUS | Status: AC
  Administered 2023-10-12: 2 g via INTRAVENOUS

## 2023-10-12 MED ORDER — HYDRALAZINE HCL 20 MG/ML IJ SOLN
10.0000 mg | INTRAMUSCULAR | Status: DC | PRN
Start: 2023-10-12 — End: 2023-10-17

## 2023-10-12 MED ORDER — KETOROLAC TROMETHAMINE 30 MG/ML IJ SOLN
INTRAMUSCULAR | Status: AC
  Filled 2023-10-12: qty 1

## 2023-10-12 MED ORDER — MEDROXYPROGESTERONE ACETATE 150 MG/ML IM SUSP: 150.0000 mg | INTRAMUSCULAR | Status: DC | PRN

## 2023-10-12 MED ORDER — LIDOCAINE-EPINEPHRINE (PF) 2 %-1:200000 IJ SOLN
INTRAMUSCULAR | Status: AC
  Filled 2023-10-12: qty 20

## 2023-10-12 MED ORDER — DIBUCAINE (PERIANAL) 1 % EX OINT: 1.0000 | TOPICAL_OINTMENT | CUTANEOUS | Status: DC | PRN

## 2023-10-12 MED ORDER — SODIUM CHLORIDE 0.9 % IV SOLN
500.0000 mg | INTRAVENOUS | Status: AC
  Administered 2023-10-12: 500 mg via INTRAVENOUS

## 2023-10-12 MED ORDER — DEXAMETHASONE SODIUM PHOSPHATE 10 MG/ML IJ SOLN
INTRAMUSCULAR | Status: DC | PRN
  Administered 2023-10-12: 4 mg via INTRAVENOUS

## 2023-10-12 MED ORDER — SOD CITRATE-CITRIC ACID 500-334 MG/5ML PO SOLN
30.0000 mL | ORAL | Status: AC
  Administered 2023-10-12: 30 mL via ORAL

## 2023-10-12 MED ORDER — OXYTOCIN-SODIUM CHLORIDE 30-0.9 UT/500ML-% IV SOLN
2.5000 [IU]/h | INTRAVENOUS | Status: AC
Start: 2023-10-12 — End: 2023-10-13

## 2023-10-12 MED ORDER — ZOLPIDEM TARTRATE 5 MG PO TABS: 5.0000 mg | ORAL_TABLET | Freq: Every evening | ORAL | Status: DC | PRN

## 2023-10-12 MED ORDER — LABETALOL HCL 200 MG PO TABS
200.0000 mg | ORAL_TABLET | Freq: Three times a day (TID) | ORAL | Status: DC
  Administered 2023-10-12 – 2023-10-15 (×11): 200 mg via ORAL
  Filled 2023-10-12 (×16): qty 1

## 2023-10-12 MED ORDER — SODIUM CHLORIDE 0.9 % IV SOLN
INTRAVENOUS | Status: AC
  Filled 2023-10-12: qty 5

## 2023-10-12 MED ORDER — ONDANSETRON HCL 4 MG/2ML IJ SOLN
INTRAMUSCULAR | Status: AC
  Filled 2023-10-12: qty 2

## 2023-10-12 MED ORDER — LABETALOL HCL 5 MG/ML IV SOLN: 80.0000 mg | INTRAVENOUS | Status: DC | PRN

## 2023-10-12 MED ORDER — GABAPENTIN 100 MG PO CAPS
100.0000 mg | ORAL_CAPSULE | Freq: Three times a day (TID) | ORAL | Status: DC
Start: 2023-10-12 — End: 2023-10-17
  Administered 2023-10-12 – 2023-10-17 (×16): 100 mg via ORAL
  Filled 2023-10-12 (×16): qty 1

## 2023-10-12 MED ORDER — STERILE WATER FOR IRRIGATION IR SOLN
Status: DC | PRN
  Administered 2023-10-12: 1000 mL

## 2023-10-12 MED ORDER — PRENATAL MULTIVITAMIN CH
1.0000 | ORAL_TABLET | Freq: Every day | ORAL | Status: DC
  Administered 2023-10-13 – 2023-10-16 (×4): 1 via ORAL
  Filled 2023-10-12 (×4): qty 1

## 2023-10-12 MED ORDER — MORPHINE SULFATE (PF) 0.5 MG/ML IJ SOLN
INTRAMUSCULAR | Status: DC | PRN
  Administered 2023-10-12: 3 mg via EPIDURAL

## 2023-10-12 MED ORDER — LACTATED RINGERS IV SOLN: INTRAVENOUS | Status: DC | PRN

## 2023-10-12 MED ORDER — FENTANYL CITRATE (PF) 100 MCG/2ML IJ SOLN
INTRAMUSCULAR | Status: AC
  Filled 2023-10-12: qty 2

## 2023-10-12 MED ORDER — FENTANYL-BUPIVACAINE-NACL 0.5-0.125-0.9 MG/250ML-% EP SOLN
EPIDURAL | Status: DC | PRN
  Administered 2023-10-12: 12 mL/h via EPIDURAL

## 2023-10-12 MED ORDER — CEFAZOLIN SODIUM-DEXTROSE 2-4 GM/100ML-% IV SOLN
INTRAVENOUS | Status: AC
  Filled 2023-10-12: qty 100

## 2023-10-12 MED ORDER — LABETALOL HCL 5 MG/ML IV SOLN
40.0000 mg | INTRAVENOUS | Status: DC | PRN
Start: 2023-10-12 — End: 2023-10-17

## 2023-10-12 MED ORDER — ACETAMINOPHEN 500 MG PO TABS
1000.0000 mg | ORAL_TABLET | Freq: Three times a day (TID) | ORAL | Status: DC
  Administered 2023-10-12 – 2023-10-17 (×15): 1000 mg via ORAL
  Filled 2023-10-12 (×15): qty 2

## 2023-10-12 MED ORDER — SIMETHICONE 80 MG PO CHEW
80.0000 mg | CHEWABLE_TABLET | Freq: Three times a day (TID) | ORAL | Status: DC
Start: 2023-10-12 — End: 2023-10-17
  Administered 2023-10-12 – 2023-10-17 (×15): 80 mg via ORAL
  Filled 2023-10-12 (×15): qty 1

## 2023-10-12 MED ORDER — ENOXAPARIN SODIUM 40 MG/0.4ML IJ SOSY
40.0000 mg | PREFILLED_SYRINGE | INTRAMUSCULAR | Status: DC
  Administered 2023-10-13 – 2023-10-17 (×5): 40 mg via SUBCUTANEOUS
  Filled 2023-10-12 (×8): qty 0.4

## 2023-10-12 MED ORDER — LIDOCAINE-EPINEPHRINE (PF) 2 %-1:200000 IJ SOLN
INTRAMUSCULAR | Status: DC | PRN
  Administered 2023-10-12 (×2): 5 mL via EPIDURAL
  Administered 2023-10-12 (×3): 2 mL via EPIDURAL

## 2023-10-12 MED ORDER — TRANEXAMIC ACID-NACL 1000-0.7 MG/100ML-% IV SOLN
1000.0000 mg | Freq: Once | INTRAVENOUS | Status: AC
  Administered 2023-10-12: 1000 mg via INTRAVENOUS
  Filled 2023-10-12: qty 100

## 2023-10-12 MED ORDER — ACETAMINOPHEN 10 MG/ML IV SOLN
INTRAVENOUS | Status: DC | PRN
  Administered 2023-10-12: 1000 mg via INTRAVENOUS

## 2023-10-12 MED ORDER — MORPHINE SULFATE (PF) 0.5 MG/ML IJ SOLN
INTRAMUSCULAR | Status: AC
  Filled 2023-10-12: qty 10

## 2023-10-12 MED ORDER — LIDOCAINE HCL (PF) 1 % IJ SOLN
INTRAMUSCULAR | Status: DC | PRN
  Administered 2023-10-12: 5 mL via EPIDURAL

## 2023-10-12 MED ORDER — SIMETHICONE 80 MG PO CHEW: 80.0000 mg | CHEWABLE_TABLET | ORAL | Status: DC | PRN

## 2023-10-12 MED ORDER — MENTHOL 3 MG MT LOZG
1.0000 | LOZENGE | OROMUCOSAL | Status: DC | PRN
  Administered 2023-10-12: 3 mg via ORAL
  Filled 2023-10-12: qty 9

## 2023-10-12 MED ORDER — WITCH HAZEL-GLYCERIN EX PADS: 1.0000 | MEDICATED_PAD | CUTANEOUS | Status: DC | PRN

## 2023-10-12 MED ORDER — ONDANSETRON HCL 4 MG/2ML IJ SOLN
INTRAMUSCULAR | Status: DC | PRN
  Administered 2023-10-12: 4 mg via INTRAVENOUS

## 2023-10-12 MED ORDER — KETOROLAC TROMETHAMINE 30 MG/ML IJ SOLN
30.0000 mg | Freq: Once | INTRAMUSCULAR | Status: AC | PRN
  Administered 2023-10-12: 30 mg via INTRAVENOUS

## 2023-10-12 MED ORDER — DEXAMETHASONE SODIUM PHOSPHATE 4 MG/ML IJ SOLN
INTRAMUSCULAR | Status: AC
  Filled 2023-10-12: qty 1

## 2023-10-12 MED ORDER — FUROSEMIDE 40 MG PO TABS
40.0000 mg | ORAL_TABLET | Freq: Every day | ORAL | Status: AC
  Administered 2023-10-12 – 2023-10-15 (×4): 40 mg via ORAL
  Filled 2023-10-12 (×5): qty 1

## 2023-10-12 MED ORDER — COCONUT OIL OIL: 1.0000 | TOPICAL_OIL | Status: DC | PRN

## 2023-10-12 MED ORDER — OXYTOCIN-SODIUM CHLORIDE 30-0.9 UT/500ML-% IV SOLN
INTRAVENOUS | Status: DC | PRN
  Administered 2023-10-12: 300 mL via INTRAVENOUS

## 2023-10-12 SURGICAL SUPPLY — 32 items
BENZOIN TINCTURE PRP APPL 2/3 (GAUZE/BANDAGES/DRESSINGS) ×1 IMPLANT
CHLORAPREP W/TINT 26 (MISCELLANEOUS) ×2 IMPLANT
CLAMP UMBILICAL CORD (MISCELLANEOUS) ×1 IMPLANT
CLOTH BEACON ORANGE TIMEOUT ST (SAFETY) ×1 IMPLANT
DRSG OPSITE POSTOP 4X10 (GAUZE/BANDAGES/DRESSINGS) ×1 IMPLANT
ELECT REM PT RETURN 9FT ADLT (ELECTROSURGICAL) ×1
ELECTRODE REM PT RTRN 9FT ADLT (ELECTROSURGICAL) ×1 IMPLANT
EXTRACTOR VACUUM M CUP 4 TUBE (SUCTIONS) IMPLANT
GAUZE PAD ABD 7.5X8 STRL (GAUZE/BANDAGES/DRESSINGS) IMPLANT
GAUZE SPONGE 4X4 12PLY STRL LF (GAUZE/BANDAGES/DRESSINGS) IMPLANT
GLOVE BIOGEL PI IND STRL 7.0 (GLOVE) ×3 IMPLANT
GLOVE ECLIPSE 7.0 STRL STRAW (GLOVE) ×1 IMPLANT
GOWN STRL REUS W/TWL LRG LVL3 (GOWN DISPOSABLE) ×2 IMPLANT
KIT ABG SYR 3ML LUER SLIP (SYRINGE) ×1 IMPLANT
MAT PREVALON FULL STRYKER (MISCELLANEOUS) IMPLANT
NDL HYPO 25X5/8 SAFETYGLIDE (NEEDLE) ×1 IMPLANT
NEEDLE HYPO 22GX1.5 SAFETY (NEEDLE) ×1 IMPLANT
NEEDLE HYPO 25X5/8 SAFETYGLIDE (NEEDLE) ×1 IMPLANT
NS IRRIG 1000ML POUR BTL (IV SOLUTION) ×1 IMPLANT
PACK C SECTION WH (CUSTOM PROCEDURE TRAY) ×1 IMPLANT
PAD ABD 7.5X8 STRL (GAUZE/BANDAGES/DRESSINGS) ×1 IMPLANT
PAD OB MATERNITY 4.3X12.25 (PERSONAL CARE ITEMS) ×1 IMPLANT
RTRCTR C-SECT PINK 25CM LRG (MISCELLANEOUS) ×1 IMPLANT
STRIP CLOSURE SKIN 1/2X4 (GAUZE/BANDAGES/DRESSINGS) ×1 IMPLANT
SUT MNCRL 0 VIOLET CTX 36 (SUTURE) ×2 IMPLANT
SUT PLAIN ABS 2-0 CT1 27XMFL (SUTURE) IMPLANT
SUT VIC AB 0 CTX36XBRD ANBCTRL (SUTURE) ×1 IMPLANT
SUT VIC AB 4-0 KS 27 (SUTURE) ×1 IMPLANT
SYR 30ML LL (SYRINGE) ×1 IMPLANT
TOWEL OR 17X24 6PK STRL BLUE (TOWEL DISPOSABLE) ×1 IMPLANT
TRAY FOLEY W/BAG SLVR 14FR LF (SET/KITS/TRAYS/PACK) ×1 IMPLANT
WATER STERILE IRR 1000ML POUR (IV SOLUTION) ×1 IMPLANT

## 2023-10-12 NOTE — Progress Notes (Signed)
 Hypoglycemic Event  CBG: 69  Treatment: 4 oz juice/soda  Symptoms: None  Follow-up CBG: Time: 0524 CBG Result: 95  Possible Reasons for Event: Inadequate meal intake  Comments/MD notified: Dr. Velvet Bathe B Nawaal Alling

## 2023-10-12 NOTE — Transfer of Care (Signed)
 Immediate Anesthesia Transfer of Care Note  Patient: Melissa Fleming  Procedure(s) Performed: CESAREAN SECTION  Patient Location: PACU  Anesthesia Type:Epidural  Level of Consciousness: awake  Airway & Oxygen Therapy: Patient Spontanous Breathing  Post-op Assessment: Report given to RN and Post -op Vital signs reviewed and stable  Post vital signs: Reviewed and stable  Last Vitals:  Vitals Value Taken Time  BP 107/90 10/12/23 0834  Temp    Pulse 88 10/12/23 0835  Resp 17 10/12/23 0835  SpO2 96 % 10/12/23 0835  Vitals shown include unfiled device data.  Last Pain:  Vitals:   10/12/23 0631  TempSrc: Axillary  PainSc:          Complications: No notable events documented.

## 2023-10-12 NOTE — Anesthesia Procedure Notes (Signed)
 Epidural Patient location during procedure: OB Start time: 10/12/2023 12:49 AM End time: 10/12/2023 1:03 AM  Staffing Anesthesiologist: Jefm Garnette LABOR, MD Performed: anesthesiologist   Preanesthetic Checklist Completed: patient identified, IV checked, site marked, risks and benefits discussed, surgical consent, monitors and equipment checked, pre-op evaluation and timeout performed  Epidural Patient position: sitting Prep: DuraPrep and site prepped and draped Patient monitoring: continuous pulse ox and blood pressure Approach: midline Location: L3-L4 Injection technique: LOR air  Needle:  Needle type: Tuohy  Needle gauge: 17 G Needle length: 9 cm and 9 Needle insertion depth: 8 cm Catheter type: closed end flexible Catheter size: 19 Gauge Catheter at skin depth: 13 cm Test dose: negative  Assessment Events: blood not aspirated, no cerebrospinal fluid, injection not painful, no injection resistance, no paresthesia and negative IV test  Additional Notes Patient identified. Risks/Benefits/Options discussed with patient including but not limited to bleeding, infection, nerve damage, paralysis, failed block, incomplete pain control, headache, blood pressure changes, nausea, vomiting, reactions to medication both or allergic, itching and postpartum back pain. Confirmed with bedside nurse the patient's most recent platelet count. Confirmed with patient that they are not currently taking any anticoagulation, have any bleeding history or any family history of bleeding disorders. Patient expressed understanding and wished to proceed. All questions were answered. Sterile technique was used throughout the entire procedure. Please see nursing notes for vital signs. Test dose was given through epidural needle and negative prior to continuing to dose epidural or start infusion. Warning signs of high block given to the patient including shortness of breath, tingling/numbness in hands, complete motor  block, or any concerning symptoms with instructions to call for help. Patient was given instructions on fall risk and not to get out of bed. All questions and concerns addressed with instructions to call with any issues. 1 Attempt (S) . Patient tolerated procedure well.

## 2023-10-12 NOTE — Lactation Note (Signed)
 This note was copied from a baby'Melissa chart.  NICU Lactation Consultation Note  Patient Name: Melissa Fleming Date: 10/12/2023 Age:36 hours  Reason for consult: Initial assessment; NICU baby; Late-preterm 34-36.6wks; Maternal endocrine disorder; Infant < 6lbs; Other (Comment); Primapara; 1st time breastfeeding (cHTN, AMA) Type of Endocrine Disorder?: Diabetes (A2GDM (glyburide ))  SUBJECTIVE Visited with family of 52 84/76 weeks old; baby Melissa Fleming got admitted to the NICU due to weigh in the OR < 2 kg. Melissa Fleming is a P1 and voiced her plan is to do both, direct breastfeeding along with pumping and bottle feeding with breastmilk/formula. This LC assisted with hand expression, breast massage, fitting of a pumping top and set her up with DEBP; she initiated pumping during [facility] consult at 9 hours post-partum; praised her for her efforts. Reviewed pumping schedule, pumping log, lactogenesis II and anticipatory guidelines.  OBJECTIVE Infant data: Mother'Melissa Current Feeding Choice: Breast Milk and Formula  O2 Device: Room Air  Infant feeding assessment IDFTS - Readiness: 2 IDFTS - Quality: 3   Maternal data: [id number] C-Section, Low Transverse Has patient been taught Hand Expression?: Yes Hand Expression Comments: no colostrum noted yet Significant Breast History:: (+) breast change during the pregnancy Current breast feeding challenges:: NICU admission Does the patient have breastfeeding experience prior to this delivery?: No Pumping frequency: initiated pumping at 9 hours post-partum Pumped volume: 0 mL Flange Size: 21 Hands-free pumping top sizes: X-Large [name]) (might need a Large) Risk factor for low/delayed milk supply:: C/Melissa, primipara, A2GDM, gHTN, infant separation  Pump:  (Stork pump referral submited)  ASSESSMENT Infant: Feeding Status: Scheduled 8-11-2-5 Feeding method: Bottle; Tube/Gavage (Bolus) Nipple Type: Nfant Extra Slow Flow (gold)  Maternal: Milk volume:  Normal  INTERVENTIONS/PLAN Interventions: Interventions: Breast feeding basics reviewed; Coconut oil; DEBP; Education; [employer] Services brochure; CDC Guidelines for Breast Pump Cleaning Tools: Pump; Flanges; Coconut oil; Hands-free pumping top Pump Education: Setup, frequency, and cleaning; Milk Storage  Plan: Encouraged pumping every 3 hours, ideally 8 pumping sessions/24 hours Breast massage, hand expression and coconut oil were also encouraged prior pumping Verify Stork pump issuance  FOB present and supportive. All questions and concerns answered, family to contact [facility] services PRN.  Consult Status: NICU follow-up NICU Follow-up type: New admission follow up   Melissa Fleming Melissa Fleming 10/12/2023, 5:39 PM

## 2023-10-12 NOTE — Op Note (Signed)
 Cesarean Section Operative Note   Patient: Melissa Fleming  Date of Procedure: 10/11/2023 - 10/12/2023  Procedure: Primary Low Transverse Cesarean   Indications: non-reassuring fetal status  Pre-operative Diagnosis: fetal intolerance to labor.   Post-operative Diagnosis: Same  TOLAC Candidate: Yes   Surgeon: Surgeons and Role:    DEWAINE Fredirick Glenys GORMAN, MD - Primary    * Nicholaus Almarie HERO, MD - Assist  Assistants: An experienced assistant was required given the standard of surgical care given the complexity of the case.  This assistant was needed for exposure, dissection, suctioning, retraction, instrument exchange, assisting with delivery with administration of fundal pressure, and for overall help during the procedure.   Anesthesia: epidural  Anesthesiologist: Peggye Delon Brunswick, MD   Antibiotics: Cefazolin  and Azithromycin    Estimated Blood Loss: 597 ml   Total IV Fluids: 1650 ml  Urine Output:  1325 cc OF clear urine  Specimens: placenta to pathology   Complications: no complications   Indications: Melissa Fleming is a 38 y.o. [id number] with an IUP [redacted]w[redacted]d presenting for unscheduled, urgent cesarean secondary to the indications listed above. Clinical course notable for inability to augment during induction and unable to get into active labor.  The risks of cesarean section discussed with the patient included but were not limited to: bleeding which may require transfusion or reoperation; infection which may require antibiotics; injury to bowel, bladder, ureters or other surrounding organs; injury to the fetus; need for additional procedures including hysterectomy in the event of a life-threatening hemorrhage; placental abnormalities with subsequent pregnancies, incisional problems, thromboembolic phenomenon and other postoperative/anesthesia complications. The patient concurred with the proposed plan, giving informed written consent for the procedure. Patient NPO status waived given  urgency of case. Anesthesia and OR aware. Preoperative prophylactic antibiotics and SCDs ordered on call to the OR.   Findings: Viable infant in cephalic presentation, nuchal x1 with cord additionally around foot. Weight 2030 g. Clear amniotic fluid. Normal placenta, three vessel cord. Normal uterus, Normal bilateral fallopian tubes, Normal bilateral ovaries. No adhesive disease was encountered.  Procedure Details: A Time Out was held and the above information confirmed. The patient received intravenous antibiotics and had sequential compression devices applied to her lower extremities preoperatively. The patient was taken back to the operative suite where epidural anesthesia was administered. After induction of anesthesia, the patient was draped and prepped in the usual sterile manner and placed in a dorsal supine position with a leftward tilt. A low transverse skin incision was made with scalpel and carried down through the subcutaneous tissue to the fascia. Fascial incision was made and extended transversely. The fascia was separated from the underlying rectus tissue superiorly and inferiorly. The rectus muscles were separated in the midline bluntly and the peritoneum was entered bluntly. An Alexis retractor was placed to aid in visualization of the uterus. A bladder flap was not developed. A low transverse uterine incision was made. The infant was successfully delivered from cephalic presentation, the umbilical cord was clamped after 1 minute. Cord ph was not sent, and cord blood was obtained for evaluation. The placenta was removed Intact and appeared normal. The uterine incision was closed with a single layer running unlocked suture of 0-Monocryl. Due to ongoing bleeding from the hysterotomy figure of eight sutures of 0 Monocryl were placed after which there was excellent hemostasis. The abdomen and the pelvis were cleared of all clot and debris and the Thersia was removed. Hemostasis was confirmed on all  surfaces.  The peritoneum was reapproximated using  2-0 vicryl . The fascia was then closed using 0 Vicryl in a running fashion. The subcutaneous layer was reapproximated with 2-0 plain gut suture. The skin was closed with a 4-0 vicryl subcuticular stitch. The patient tolerated the procedure well. Sponge, lap, instrument and needle counts were correct x 2. She was taken to the recovery room in stable condition.  Disposition: PACU - hemodynamically stable.    Signed: [name], MD OB Fellow, [facility] for [facility], [facility]

## 2023-10-12 NOTE — Progress Notes (Signed)
 Pt transferred via wheelchair to [facility] Care Room 328. Pt and husband oriented to room. All questions answered. Bed in low position and call light within reach. NICU Dr and RN updated parents on infants condition.

## 2023-10-12 NOTE — Progress Notes (Signed)
 Hypoglycemic Event  CBG: 59  Treatment: 4 oz juice/soda  Symptoms: None  Follow-up CBG: Time:0047 CBG Result:89  Possible Reasons for Event: Inadequate meal intake  Comments/MD notified: Dr. Velvet Bathe B Myrtle Barnhard

## 2023-10-12 NOTE — Anesthesia Preprocedure Evaluation (Signed)
 Anesthesia Evaluation   Patient awake    Reviewed: Allergy & Precautions, NPO status , Patient's Chart, lab work & pertinent test results  Airway Mallampati: II  TM Distance: >3 FB Neck ROM: Full    Dental no notable dental hx. (+) Teeth Intact, Dental Advisory Given   Pulmonary    Pulmonary exam normal breath sounds clear to auscultation       Cardiovascular hypertension, Pt. on medications Normal cardiovascular exam Rhythm:Regular Rate:Normal     Neuro/Psych    GI/Hepatic negative GI ROS, Neg liver ROS,,,  Endo/Other  diabetes, Gestational    Renal/GU negative Renal ROS     Musculoskeletal   Abdominal   Peds  Hematology Lab Results      Component                Value               Date                      WBC                      5.9                 10/11/2023                HGB                      12.5                10/11/2023                HCT                      40.9                10/11/2023                MCV                      96.9                10/11/2023                PLT                      360                 10/11/2023              Anesthesia Other Findings   Reproductive/Obstetrics (+) Pregnancy                             Anesthesia Physical Anesthesia Plan  ASA: 3  Anesthesia Plan: Epidural   Post-op Pain Management:    Induction:   PONV Risk Score and Plan:   Airway Management Planned:   Additional Equipment:   Intra-op Plan:   Post-operative Plan:   Informed Consent: I have reviewed the patients History and Physical, chart, labs and discussed the procedure including the risks, benefits and alternatives for the proposed anesthesia with the patient or authorized representative who has indicated his/her understanding and acceptance.       Plan Discussed with:   Anesthesia Plan Comments: (36.3 G5p1 w Gdm and gHtn for LEA)  Anesthesia Quick Evaluation

## 2023-10-12 NOTE — Anesthesia Postprocedure Evaluation (Signed)
 Anesthesia Post Note  Patient: Melissa Fleming  Procedure(s) Performed: CESAREAN SECTION     Patient location during evaluation: PACU Anesthesia Type: Epidural Level of consciousness: awake Pain management: pain level controlled Vital Signs Assessment: post-procedure vital signs reviewed and stable Respiratory status: spontaneous breathing, nonlabored ventilation and respiratory function stable Cardiovascular status: stable Postop Assessment: no headache, no backache and epidural receding Anesthetic complications: no   No notable events documented.  Last Vitals:  Vitals:   10/12/23 1200 10/12/23 1300  BP: (!) 133/98 (!) 158/98  Pulse: 85 73  Resp:  20  Temp:  36.6 C  SpO2: 98% 97%    Last Pain:  Vitals:   10/12/23 1300  TempSrc: Oral  PainSc:                  Melissa Fleming

## 2023-10-12 NOTE — Progress Notes (Signed)
 LABOR PROGRESS NOTE  Patient Name: Melissa Fleming, female   DOB: 09/04/1986, 38 y.o.  MRN: [id number]  Continued Cat II tracing on no augmentation. Still with mod variability and accels between variables. Contracting on her own; however not adequate. Discussed risks/benefits of FSE placement, and verbal consent obtained. Placed without difficulty. Mom and babe tolerated well. Restart pit when able.  [name], MD FMOB Fellow, Faculty practice [facility], Center for [facility] 10/12/23  4:17 AM

## 2023-10-12 NOTE — Progress Notes (Signed)
 Patient ID: Melissa Fleming, female   DOB: [birth year], 38 y.o.   MRN: [id number] Patient is here for induction of labor for FGR.  Prior to any induction method the patient was having D cells.  She did have a balloon for small period of time and AROM.  She has now been ruptured for greater than 8 hours and continues to have severe variables with an overall reassuring fetal heart rate tracing with positive spontaneous accelerations as well as moderate variability.  We were hopeful that AROM would be enough to get her into labor but she has had minimal cervical change over the last 8 hours despite contractions.  We have tried IUPC position changes, amnioinfusion to no avail. Given fetal intolerance of labor will move to cesarean delivery. Risk benefits alternatives were discussed with the patient.  Consent is signed.  Will proceed to the OR when ready.

## 2023-10-12 NOTE — Progress Notes (Signed)
 LABOR PROGRESS NOTE  Patient Name: Melissa Fleming, female   DOB: [birth year], 38 y.o.  MRN: [id number]  Rechecked patient. No significant cervical change from my last exam. She has now been ruptured for ~12 hours. IUPC/FSE in place. Remained Cat II tracing with recurrent variables and have remained unable to restart pitocin . Have not been able to obtain adequate contractions. Discussed cesarean delivery is indicated at this time.  The risks of cesarean section discussed with the patient included but were not limited to: bleeding which may require transfusion or reoperation; infection which may require antibiotics; injury to bowel, bladder, ureters or other surrounding organs; injury to the fetus; need for additional procedures including hysterectomy in the event of a life-threatening hemorrhage; placental abnormalities with subsequent pregnancies, incisional problems, thromboembolic phenomenon and other postoperative/anesthesia complications. The patient concurred with the proposed plan, giving informed written consent for the procedure. Patient NPO status waived given urgency of case. Anesthesia and OR aware. Preoperative prophylactic antibiotics and SCDs ordered on call to the OR.    [name], MD FMOB Fellow, Faculty practice [facility], Center for [facility] 10/12/23  7:06 AM

## 2023-10-13 LAB — CBC
HCT: 29.5 % — ABNORMAL LOW (ref 36.0–46.0)
Hemoglobin: 9.7 g/dL — ABNORMAL LOW (ref 12.0–15.0)
MCH: 29.3 pg (ref 26.0–34.0)
MCHC: 32.9 g/dL (ref 30.0–36.0)
MCV: 89.1 fL (ref 80.0–100.0)
Platelets: 287 10*3/uL (ref 150–400)
RBC: 3.31 MIL/uL — ABNORMAL LOW (ref 3.87–5.11)
RDW: 13.8 % (ref 11.5–15.5)
WBC: 15.5 10*3/uL — ABNORMAL HIGH (ref 4.0–10.5)
nRBC: 0 % (ref 0.0–0.2)

## 2023-10-13 MED ORDER — IBUPROFEN 800 MG PO TABS
800.0000 mg | ORAL_TABLET | Freq: Three times a day (TID) | ORAL | Status: DC
  Administered 2023-10-13 – 2023-10-17 (×13): 800 mg via ORAL
  Filled 2023-10-13 (×13): qty 1

## 2023-10-13 NOTE — Progress Notes (Signed)
 POSTPARTUM PROGRESS NOTE  Subjective: Melissa Fleming is a 38 y.o. H4E9768 s/p pLTCS at [redacted]w[redacted]d.  She reports she is doing well. No acute events overnight. She denies any problems with ambulating, or po intake. Denies nausea or vomiting. She has passed flatus. Pain is well controlled.  Lochia is light. Some difficulty with urinating after foley out, however is urinating this AM.  Objective: Blood pressure 107/74, pulse 77, temperature 98.6 F (37 C), temperature source Oral, resp. rate 20, height 5' (1.524 m), weight 90.4 kg, SpO2 99%, unknown if currently breastfeeding.  Physical Exam:  General: alert, cooperative and no distress Chest: no respiratory distress Abdomen: soft, non-tender  Uterine Fundus: firm and at level of umbilicus Extremities: No calf swelling or tenderness  Trace edema  Recent Labs    10/11/23 0712 10/11/23 2346  HGB 13.5 12.5  HCT 39.6 40.9    Assessment/Plan: Melissa Fleming is a 38 y.o. H4E9768 s/p pLTCS at [redacted]w[redacted]d for NRFHT.  Routine Postpartum Care: Doing well, pain well-controlled. AM CBC pending -- Continue routine care, lactation support  -- Contraception: POPs -- Feeding: both. Babe in NICU for hypoglycemia  -- cHTN: BP well-controlled on lasix  and half prior antihypertensive dosing (Procardia  30 mg XL every day, labetalol  200 mg TID) -- A2GDM: Fasting CBG 80  Dispo: Plan for discharge 1/8 or 1/9.  Nicholaus Almarie HERO, MD OB Fellow 10/13/2023 7:01 AM

## 2023-10-13 NOTE — Lactation Note (Signed)
 This note was copied from a baby's chart.  NICU Lactation Consultation Note  Patient Name: Melissa Fleming Date: 10/13/2023 Age:36 hours  Reason for consult: Follow-up assessment; Primapara; 1st time breastfeeding; NICU baby; Late-preterm 34-36.6wks; Infant < 6lbs Type of Endocrine Disorder?: PCOS; Diabetes (GDM type 2)  SUBJECTIVE  LC in to visit with P1 Mom of [name] in the NICU.  Infant was born at [redacted]w[redacted]d and birthweight os 2030 gm.  Mom had been sleeping for 2 hrs when RN had to awaken Mom for medication and STS with baby.  Mom reports she pumped 3 times yesterday.  Encouraged Mom to increase her frequency of pumping as she is feeling better today.  Encouraged pumping after STS.  STORK pump delivered for Mom to use at home.  LC noticed pump parts at the bedside and offered to disassemble and wash for Mom.   LC labeled the washing bin and provided a cloth in drying bin and clean paper towel for parts to air dry.   Mom has her pumping top. Encouraged hands on pumping using breast massage to enhance the stimulation. Mom denies any questions.  RN taking Mom to the bathroom.  OBJECTIVE Infant data: Mother's Current Feeding Choice: Breast Milk and Formula  O2 Device: Room Air  Infant feeding assessment IDFTS - Readiness: 4 IDFTS - Quality: 3   Maternal data: [id number] C-Section, Low Transverse Has patient been taught Hand Expression?: Yes Hand Expression Comments: no colostrum noted yet Significant Breast History:: (+) breast change during the pregnancy Current breast feeding challenges:: NICU admission Does the patient have breastfeeding experience prior to this delivery?: No Pumping frequency: 3 times yesterday, encouraged to pump every 3 hrs today Pumped volume: 0 mL (drops) Flange Size: 21 Hands-free pumping top sizes: X-Large Melissa Fleming) Risk factor for low/delayed milk supply:: C/S, primipara, A2GDM, gHTN, infant separation  Pump: Stork  Pump  ASSESSMENT Infant:   Feeding Status: Scheduled 8-11-2-5 Feeding method: Tube/Gavage (Bolus) Nipple Type: Nfant Extra Slow Flow (gold)  Maternal: Milk volume: Normal  INTERVENTIONS/PLAN Interventions: Interventions: Skin to skin; Breast massage; Hand express; Coconut oil; DEBP Tools: Pump; Flanges; Hands-free pumping top Pump Education: Setup, frequency, and cleaning; Milk Storage  Plan: 1- STS with baby often 2- breast massage and hand express 3- Pump both breasts 15 mins 8 times per 24 hrs 4- ask for LC prn  Consult Status: NICU follow-up NICU Follow-up type: Verify onset of copious milk; Verify absence of engorgement   Melissa Fleming BRAVO 10/13/2023, 11:04 AM

## 2023-10-13 NOTE — Social Work (Signed)
 Patient screened out for psychosocial assessment since none of the following apply: Psychosocial stressors documented in mother or baby's chart Gestation less than 32 weeks Code at delivery  Infant with anomalies  Please contact the Clinical Social Worker if specific needs arise, by MOB's request, or if MOB scores greater than 9/yes to question 10 on [place] Postpartum Depression Screen. MOB completed Van, scored: 4  Nat Quiet, MSW, LCSW Clinical Social Worker  [phone] Jan 29, 2024  11:01 AM

## 2023-10-14 ENCOUNTER — Ambulatory Visit: Payer: Medicaid Other | Admitting: Cardiology

## 2023-10-14 LAB — SURGICAL PATHOLOGY

## 2023-10-14 NOTE — Plan of Care (Signed)
  Problem: Education: Goal: Knowledge of General Education information will improve Description: Including pain rating scale, medication(s)/side effects and non-pharmacologic comfort measures Outcome: Progressing   Problem: Health Behavior/Discharge Planning: Goal: Ability to manage health-related needs will improve Outcome: Progressing   Problem: Clinical Measurements: Goal: Ability to maintain clinical measurements within normal limits will improve Outcome: Progressing Goal: Will remain free from infection Outcome: Progressing Goal: Diagnostic test results will improve Outcome: Progressing Goal: Respiratory complications will improve Outcome: Progressing Goal: Cardiovascular complication will be avoided Outcome: Progressing   Problem: Activity: Goal: Risk for activity intolerance will decrease Outcome: Progressing   Problem: Nutrition: Goal: Adequate nutrition will be maintained Outcome: Progressing   Problem: Coping: Goal: Level of anxiety will decrease Outcome: Progressing   Problem: Elimination: Goal: Will not experience complications related to bowel motility Outcome: Progressing Goal: Will not experience complications related to urinary retention Outcome: Progressing   Problem: Pain Management: Goal: General experience of comfort will improve Outcome: Progressing   Problem: Safety: Goal: Ability to remain free from injury will improve Outcome: Progressing   Problem: Skin Integrity: Goal: Risk for impaired skin integrity will decrease Outcome: Progressing   Problem: Education: Goal: Knowledge of condition will improve Outcome: Progressing Goal: Individualized Educational Video(s) Outcome: Progressing Goal: Individualized Newborn Educational Video(s) Outcome: Progressing   Problem: Activity: Goal: Will verbalize the importance of balancing activity with adequate rest periods Outcome: Progressing Goal: Ability to tolerate increased activity will  improve Outcome: Progressing   Problem: Coping: Goal: Ability to identify and utilize available resources and services will improve Outcome: Progressing   Problem: Life Cycle: Goal: Chance of risk for complications during the postpartum period will decrease Outcome: Progressing   Problem: Role Relationship: Goal: Ability to demonstrate positive interaction with newborn will improve Outcome: Progressing   Problem: Skin Integrity: Goal: Demonstration of wound healing without infection will improve Outcome: Progressing   Problem: Education: Goal: Knowledge of disease or condition will improve Outcome: Progressing Goal: Knowledge of the prescribed therapeutic regimen will improve Outcome: Progressing   Problem: Fluid Volume: Goal: Peripheral tissue perfusion will improve Outcome: Progressing   Problem: Clinical Measurements: Goal: Complications related to disease process, condition or treatment will be avoided or minimized Outcome: Progressing

## 2023-10-14 NOTE — Lactation Note (Signed)
 This note was copied from a baby's chart.  NICU Lactation Consultation Note  Patient Name: Melissa Fleming Date: 10/14/2023 Age:36 hours  Reason for consult: Follow-up assessment; 1st time breastfeeding; Primapara; NICU baby; Late-preterm 34-36.6wks; Infant < 6lbs; Maternal endocrine disorder; Other (Comment) (cHTN, AMA) Type of Endocrine Disorder?: Diabetes; PCOS (A2GDM (glyburide ))  SUBJECTIVE Visited with family of 73 58/80 weeks old AGA NICU female; Melissa Fleming reported that she's pumping but nothing comes out she's only getting drops. Re-educated about the importance of consistent pumping for the onset of lactogenesis II and to protect her supply, let her know that the purpose of pumping this early on is mainly for breast stimulation and not to get volume, she voiced understanding.   OBJECTIVE Infant data: Mother's Current Feeding Choice: Breast Milk and Formula  O2 Device: Room Air  Infant feeding assessment IDFTS - Readiness: 2 IDFTS - Quality: 2   Maternal data: [id number] C-Section, Low Transverse Pumping frequency: 3-4 times/24 hours Pumped volume: 0 mL (droplets) Flange Size: 21 Hands-free pumping top sizes: Small/Medium (Blue) (Needs a size L)  Pump: Stork Pump (Spectra  S2)  ASSESSMENT Infant: Feeding Status: Scheduled 8-11-2-5 Feeding method: Bottle; Tube/Gavage (Bolus) Nipple Type: Nfant Extra Slow Flow (gold)  Maternal: Milk volume: Normal  INTERVENTIONS/PLAN Interventions: Interventions: Breast feeding basics reviewed; DEBP; Education; Coconut oil Tools: Pump; Flanges; Hands-free pumping top; Coconut oil Pump Education: Setup, frequency, and cleaning; Milk Storage  Plan: Encouraged pumping every 3 hours, ideally 8 pumping sessions/24 hours Breast massage, hand expression and coconut oil were also encouraged prior pumping STS with baby Melissa Fleming whenever possible   No other support person at this time. All questions and concerns answered, family to  contact [facility] services PRN.  Consult Status: NICU follow-up NICU Follow-up type: Maternal D/C visit   Melissa Fleming Melissa Fleming 10/14/2023, 11:58 AM

## 2023-10-15 MED ORDER — FUROSEMIDE 40 MG PO TABS
40.0000 mg | ORAL_TABLET | Freq: Once | ORAL | Status: AC
  Administered 2023-10-16: 40 mg via ORAL
  Filled 2023-10-15: qty 1

## 2023-10-15 NOTE — Lactation Note (Signed)
 This note was copied from a baby's chart.  NICU Lactation Consultation Note  Patient Name: Melissa Fleming Date: 10/15/2023 Age:36 hours  Reason for consult: Follow-up assessment; 1st time breastfeeding; Primapara; NICU baby; Late-preterm 34-36.6wks; Infant < 6lbs; Maternal endocrine disorder; Breastfeeding assistance; Maternal discharge Type of Endocrine Disorder?: Diabetes; PCOS (A2GDM (glyburide ))  SUBJECTIVE Visited with family of 37 65/26 weeks old AGA NICU female twice, the first time to check on pumping status and the second time for the 2 pm feeding assist, but baby Melissa Fleming did not wake up, gavage feeding already running. [name] reported she's been pumping and getting more drops of colostrum praised her for her efforts. She's getting discharged today and noticed that pumping is still not consistent. Provided a new pumping band in size L for a better fit. Reviewed discharge education and the importance of consistent pumping for the onset of lactogenesis II and the prevention of engorgement.   OBJECTIVE Infant data: Mother's Current Feeding Choice: Breast Milk and Formula  O2 Device: Room Air  Infant feeding assessment IDFTS - Readiness: 2 IDFTS - Quality: 2   Maternal data: [id number] C-Section, Low Transverse Pumping frequency: 2 times/24 hours Pumped volume: 0 mL (more drops today) Flange Size: 21 Hands-free pumping top sizes: Large Melissa Fleming)  Pump: Stork Pump (Spectra  S2)  ASSESSMENT Infant: Feeding Status: Scheduled 8-11-2-5 Feeding method: Bottle; Tube/Gavage (Bolus) Nipple Type: Nfant Extra Slow Flow (gold)  Maternal: Milk volume: Normal  INTERVENTIONS/PLAN Interventions: Interventions: Breast feeding basics reviewed; Coconut oil; DEBP; Education Discharge Education: Engorgement and breast care Tools: Pump; Flanges; Hands-free pumping top; Coconut oil Pump Education: Setup, frequency, and cleaning; Milk Storage  Plan: Encouraged pumping every 3 hours,  ideally 8 pumping sessions/24 hours She'll take all pump parts out of couplet care in case baby Melissa Fleming is moved to a different room She'll switch her pump settings from initiate to maintain mode once she starts getting 20 ml of EBM combined STS with baby Melissa Fleming whenever possible   No other support person at this time. All questions and concerns answered, family to contact [facility] services PRN.  Consult Status: NICU follow-up NICU Follow-up type: Maternal D/C visit   Melissa Fleming Crate 10/15/2023, 2:15 PM

## 2023-10-16 LAB — COMPREHENSIVE METABOLIC PANEL
ALT: 70 U/L — ABNORMAL HIGH (ref 0–44)
AST: 94 U/L — ABNORMAL HIGH (ref 15–41)
Albumin: 2.8 g/dL — ABNORMAL LOW (ref 3.5–5.0)
Alkaline Phosphatase: 81 U/L (ref 38–126)
Anion gap: 11 (ref 5–15)
BUN: 14 mg/dL (ref 6–20)
CO2: 23 mmol/L (ref 22–32)
Calcium: 8.8 mg/dL — ABNORMAL LOW (ref 8.9–10.3)
Chloride: 102 mmol/L (ref 98–111)
Creatinine, Ser: 0.6 mg/dL (ref 0.44–1.00)
GFR, Estimated: >60 mL/min (ref >60)
Glucose, Bld: 80 mg/dL (ref 70–99)
Potassium: 3.6 mmol/L (ref 3.5–5.1)
Sodium: 136 mmol/L (ref 135–145)
Total Bilirubin: <0.2 mg/dL (ref 0.0–1.2)
Total Protein: 6.7 g/dL (ref 6.5–8.1)

## 2023-10-16 LAB — MAGNESIUM: Magnesium: 1.8 mg/dL (ref 1.7–2.4)

## 2023-10-16 MED ORDER — NIFEDIPINE ER OSMOTIC RELEASE 60 MG PO TB24
60.0000 mg | ORAL_TABLET | Freq: Every day | ORAL | Status: DC
  Administered 2023-10-16 – 2023-10-17 (×2): 60 mg via ORAL
  Filled 2023-10-16 (×2): qty 1

## 2023-10-16 MED ORDER — LABETALOL HCL 200 MG PO TABS
400.0000 mg | ORAL_TABLET | Freq: Two times a day (BID) | ORAL | Status: DC
  Administered 2023-10-16 – 2023-10-17 (×3): 400 mg via ORAL
  Filled 2023-10-16 (×4): qty 2

## 2023-10-16 NOTE — Progress Notes (Signed)
 POSTPARTUM PROGRESS NOTE  Subjective: Tynesia Harral is a 38 y.o. H4E9768 POD#4 s/p pLTCS at [redacted]w[redacted]d.  Postpartum course complicated by difficult to control BP.  She reports she is doing well. No acute events overnight. She denies any problems with ambulating, or po intake. Denies nausea or vomiting. She has passed flatus and had a bowel movement. Urinating without difficulty.  Pain is well controlled.  Lochia is light. Patient denies any headaches, visual symptoms, RUQ/epigastric pain or other concerning symptoms.  Objective: Blood pressure (!) 117/91, pulse 91, temperature 98.8 F (37.1 C), temperature source Oral, resp. rate 18, height 5' (1.524 m), weight 90.4 kg, SpO2 100%, unknown if currently breastfeeding.  Patient Vitals for the past 24 hrs:  BP Temp Temp src Pulse Resp SpO2  10/16/23 1233 (!) 117/91 98.8 F (37.1 C) Oral 91 18 100 %  10/16/23 0501 (!) 157/102 98.7 F (37.1 C) Oral 92 14 100 %  10/15/23 2213 (!) 144/95 -- -- 92 -- --  10/15/23 1920 (!) 140/95 98.9 F (37.2 C) Oral 93 16 100 %  10/15/23 1356 115/77 98.2 F (36.8 C) Oral 91 16 --    Physical Exam:  General: alert, cooperative and no distress Chest: no respiratory distress Abdomen: soft, non-tender, incision C/D/I Uterine Fundus: firm and at level of umbilicus Extremities: No calf swelling or tenderness  Trace edema    Assessment/Plan: Kimberlly Norgard is a 38 y.o. H4E9768 POD#4 s/p pLTCS at [redacted]w[redacted]d for NRFHT.  -- cHTN: Increased Procardia  to 60 mg daily and Labetalol  to 400 mg bid this morning, BP now is 117/91. If remains stable, will discharge on this regimen.  On Lasix  20 mg daily.   -- A2GDM: Fasting CBG 80  Routine Postpartum Care: Doing well, pain well-controlled. -- Continue routine care, lactation support  -- Contraception: POPs -- Feeding: both. Baby in NICU for hypoglycemia    Dispo: Plan for discharge 1/11  Herchel Gloris LABOR, MD 10/16/2023 1:18 PM

## 2023-10-16 NOTE — Plan of Care (Signed)
  Problem: Coping: Goal: Ability to identify and utilize available resources and services will improve Outcome: Progressing   Problem: Role Relationship: Goal: Ability to demonstrate positive interaction with newborn will improve Outcome: Progressing

## 2023-10-16 NOTE — Lactation Note (Signed)
 This note was copied from a baby's chart.  NICU Lactation Consultation Note  Patient Name: Melissa Fleming Date: 10/16/2023 Age:36 days  Reason for consult: Follow-up assessment; Primapara; 1st time breastfeeding; NICU baby; Early term 37-38.6wks; Infant < 6lbs; Maternal endocrine disorder; Breastfeeding assistance; RN request; Other (Comment) (GHTN) Type of Endocrine Disorder?: PCOS; Diabetes (GDM)  SUBJECTIVE  RN asked LC to observe as Melissa Fleming had just put baby Melissa Fleming on the breast.  LC noted Melissa Fleming in recliner leaning into baby while he was sucking on Melissa Fleming's nipple.  LC offered to help and Melissa Fleming agreed.  LC provided pillow support under baby, removed swaddle and assisted Melissa Fleming to support baby's head ear to ear and support her breast back from areola.  Melissa Fleming with an erect nipple and very compressible areola.  Hand expressed a drop of milk onto nipple.  LC assisted Melissa Fleming to tease baby with nipple, and when he opened his mouth widely, LC assisted Melissa Fleming to bring baby to breast leading with his chin.  Baby able to attain a deep latch and suck a couple times before stopping.  Baby re-latched many times with rooting.  Teaching done so Melissa Fleming understands to watch for baby's readiness prior to latching.  Baby on the breast on and off for 15 mins, sucking non-nutritively.  No swallows identified at this feeding.  LC assisted Melissa Fleming to place baby STS on her chest after feeding, where he relaxed contented with gavage feeding going.    Instructed Melissa Fleming on when baby becomes more consistent with his feeding at the breast, the amount gavaged will decrease in accordance to quality and length of feeding.  LC noted pump parts floating in water .  LC washed the pump parts and educated Melissa Fleming on not to soak longer than 3-5 mins per CDC guidelines.  Melissa Fleming to increase her pumping to every 3 hrs today.  Breasts are filling.  Melissa Fleming to use the maintain mode from now on.  Engorgement prevention and treatment reviewed.  Melissa Fleming to have RN call  for [facility] prn today for help with positioning and latching baby Melissa Fleming to the breast.  OBJECTIVE Infant data: Mother's Current Feeding Choice: Breast Milk and Formula  O2 Device: Room Air  Infant feeding assessment IDFTS - Readiness: 2 IDFTS - Quality: 3   Maternal data: [id number] C-Section, Low Transverse Pumping frequency: Pumped this morning as breasts felt fuller, will pump every 3 hrs at feeding times Pumped volume: 10 mL Flange Size: 21 Hands-free pumping top sizes: Large Alejos)  Pump: Stork Pump (Spectra  S2)  ASSESSMENT Infant: Latch: Repeated attempts needed to sustain latch, nipple held in mouth throughout feeding, stimulation needed to elicit sucking reflex. Audible Swallowing: None Type of Nipple: Everted at rest and after stimulation Comfort (Breast/Nipple): Soft / non-tender (filling) Hold (Positioning): Assistance needed to correctly position infant at breast and maintain latch. LATCH Score: 6  Feeding Status: Scheduled 8-11-2-5 Feeding method: Breast Nipple Type: Dr. Jonna Fling Preemie  Maternal: Milk volume: Low  INTERVENTIONS/PLAN Interventions: Interventions: Breast feeding basics reviewed; Assisted with latch; Skin to skin; Breast massage; Hand express; Breast compression; Adjust position; Support pillows; Position options; DEBP Discharge Education: Engorgement and breast care Tools: Pump; Flanges; Hands-free pumping top; Bottle Pump Education: Setup, frequency, and cleaning  Plan: 1- STS with baby as much as possible 2- Offer the breast with cues, asking for help prn 3-Pump both breasts 15-30 mins, adding breast massage and hand expression 4- ask for ice if breasts becomes engorged.  Consult Status: NICU follow-up  NICU Follow-up type: Verify absence of engorgement; Verify onset of copious milk; Maternal D/C visit   Claudene Aleck BRAVO 10/16/2023, 11:09 AM

## 2023-10-17 ENCOUNTER — Other Ambulatory Visit (HOSPITAL_COMMUNITY): Payer: Self-pay

## 2023-10-17 MED ORDER — IBUPROFEN 800 MG PO TABS
800.0000 mg | ORAL_TABLET | Freq: Three times a day (TID) | ORAL | 0 refills | Status: AC
  Filled 2023-10-17: qty 30, 10d supply, fill #0

## 2023-10-17 MED ORDER — ACETAMINOPHEN 500 MG PO TABS
1000.0000 mg | ORAL_TABLET | Freq: Three times a day (TID) | ORAL | 0 refills | Status: AC
  Filled 2023-10-17: qty 30, 5d supply, fill #0

## 2023-10-17 MED ORDER — NIFEDIPINE ER 60 MG PO TB24
60.0000 mg | ORAL_TABLET | Freq: Every day | ORAL | 1 refills | Status: AC
  Filled 2023-10-17: qty 30, 30d supply, fill #0

## 2023-10-17 MED ORDER — OXYCODONE HCL 5 MG PO TABS
5.0000 mg | ORAL_TABLET | ORAL | 0 refills | Status: DC | PRN
  Filled 2023-10-17: qty 5, 1d supply, fill #0

## 2023-10-17 MED ORDER — SENNOSIDES-DOCUSATE SODIUM 8.6-50 MG PO TABS
2.0000 | ORAL_TABLET | Freq: Two times a day (BID) | ORAL | 0 refills | Status: AC | PRN
  Filled 2023-10-17: qty 60, 15d supply, fill #0

## 2023-10-17 MED ORDER — LABETALOL HCL 200 MG PO TABS
400.0000 mg | ORAL_TABLET | Freq: Two times a day (BID) | ORAL | 1 refills | Status: DC
  Filled 2023-10-17: qty 100, 25d supply, fill #0

## 2023-10-17 NOTE — Discharge Summary (Signed)
 Postpartum Discharge Summary      Patient Name: Melissa Fleming DOB: [birth year] MRN: [id number]  Date of admission: 10/11/2023 Delivery date:10/12/2023 Delivering provider: FREDIRICK GLENYS RAMAN Date of discharge: 10/17/2023  Admitting diagnosis: Chronic hypertension affecting pregnancy [O10.919] Intrauterine pregnancy: [redacted]w[redacted]d     Secondary diagnosis:  Principal Problem:   Status post primary low transverse cesarean section Active Problems:   Chronic hypertension affecting pregnancy   Supervision of high-risk pregnancy   BMI 36.0-36.9,adult   Abnormal chromosomal and genetic finding on antenatal screening mother   Carpal tunnel syndrome during pregnancy   Oral hypoglycemic controlled White classification A2 gestational diabetes mellitus (GDM)   Fetal growth restriction antepartum   PVC (premature ventricular contraction)   Fetal heart rate decelerations affecting management of mother   GBS (group B Streptococcus carrier), +RV culture, currently pregnant  Additional problems: PP elevated BPs    Discharge diagnosis: Term Pregnancy Delivered, CHTN, and GDM                                               Post partum procedures: n/a Augmentation: AROM, Pitocin , and IP Foley Complications: None  Hospital course:  Induction of Labor With Cesarean Section   38 y.o. yo [phone] at [redacted]w[redacted]d was admitted to the hospital 10/11/2023 for induction of labor. Patient had a labor course significant for inability to achieve active labor 2/2 fetal intolerance. The patient went for cesarean section due to Non-Reassuring FHR. Delivery details are as follows: Membrane Rupture Time/Date: 8:28 PM,10/11/2023  Delivery Method:C-Section, Low Transverse Operative Delivery:N/A Details of operation can be found in separate operative Note.  Patient had a postpartum course complicated by uncontrolled hypertension delaying discharge.  Finally controlled on d/c regimen of Labetalol  400mg  BID and Procardia  60mg . She is ambulating,  tolerating a regular diet, passing flatus, and urinating well.  Patient is discharged home in stable condition on 10/12/23.      Magnesium  Sulfate received: No BMZ received: No Rhophylac:No MMR:N/A T-DaP:declined Flu: No RSV Vaccine received: No Transfusion:No  Immunizations received: Immunization History  Administered Date(s) Administered   Influenza, Quadrivalent, Recombinant, Inj, Pf 08/24/2018, 08/24/2018   PPD Test 04/23/2020, 06/19/2020, 08/24/2021   Tdap 05/11/2020    Physical exam  Vitals:   10/16/23 1233 10/16/23 1600 10/16/23 2219 10/17/23 0530  BP: (!) 117/91 114/70 131/84 129/79  Pulse: 91 83 90 96  Resp: 18 18 18 18   Temp: 98.8 F (37.1 C) 98.1 F (36.7 C) 98.5 F (36.9 C) 98.3 F (36.8 C)  TempSrc: Oral Oral Oral Oral  SpO2: 100% 100% 100% 100%  Weight:      Height:       General: alert, cooperative, and no distress Lochia: appropriate Uterine Fundus: firm Incision: Healing well with no significant drainage, No significant erythema, Dressing is clean, dry, and intact DVT Evaluation: No evidence of DVT seen on physical exam. Labs: Lab Results  Component Value Date   WBC 15.5 (H) 10/13/2023   HGB 9.7 (L) 10/13/2023   HCT 29.5 (L) 10/13/2023   MCV 89.1 10/13/2023   PLT 287 10/13/2023      Latest Ref Rng & Units 10/16/2023    6:07 AM  CMP  Glucose 70 - 99 mg/dL 80   BUN 6 - 20 mg/dL 14   Creatinine 9.55 - 1.00 mg/dL 9.39   Sodium 864 - 854 mmol/L 136  Potassium 3.5 - 5.1 mmol/L 3.6   Chloride 98 - 111 mmol/L 102   CO2 22 - 32 mmol/L 23   Calcium  8.9 - 10.3 mg/dL 8.8   Total Protein 6.5 - 8.1 g/dL 6.7   Total Bilirubin 0.0 - 1.2 mg/dL <9.7   Alkaline Phos 38 - 126 U/L 81   AST 15 - 41 U/L 94   ALT 0 - 44 U/L 70    Edinburgh Score:    10/13/2023    5:21 AM  Edinburgh Postnatal Depression Scale Screening Tool  I have been able to laugh and see the funny side of things. 0  I have looked forward with enjoyment to things. 0  I have blamed  myself unnecessarily when things went wrong. 1  I have been anxious or worried for no good reason. 1  I have felt scared or panicky for no good reason. 0  Things have been getting on top of me. 1  I have been so unhappy that I have had difficulty sleeping. 0  I have felt sad or miserable. 1  I have been so unhappy that I have been crying. 0  The thought of harming myself has occurred to me. 0  Edinburgh Postnatal Depression Scale Total 4   No data recorded  After visit meds:  Allergies as of 10/17/2023       Reactions   Morphine  Hives        Medication List     STOP taking these medications    aspirin  EC 81 MG tablet       TAKE these medications    Accu-Chek Guide test strip Generic drug: glucose blood Use as instructed QID   Accu-Chek Guide w/Device Kit USE AS DIRECTED ONCE DAILY TO CHECK BLOOD SUGAR   Accu-Chek Softclix Lancets lancets Use as instructed QID   acetaminophen  500 MG tablet Commonly known as: TYLENOL  Take 2 tablets (1,000 mg total) by mouth every 8 (eight) hours.   glyBURIDE  5 MG tablet Commonly known as: DIABETA  Take 1 tablet (5 mg total) by mouth daily with breakfast.   ibuprofen  800 MG tablet Commonly known as: ADVIL  Take 1 tablet (800 mg total) by mouth every 8 (eight) hours.   labetalol  200 MG tablet Commonly known as: NORMODYNE  Take 2 tablets (400 mg total) by mouth 3 (three) times daily. What changed: Another medication with the same name was added. Make sure you understand how and when to take each.   labetalol  200 MG tablet Commonly known as: NORMODYNE  Take 2 tablets (400 mg total) by mouth 2 (two) times daily. What changed: You were already taking a medication with the same name, and this prescription was added. Make sure you understand how and when to take each.   NIFEdipine  60 MG 24 hr tablet Commonly known as: ADALAT  CC Take 1 tablet (60 mg total) by mouth daily.   oxyCODONE  5 MG immediate release tablet Commonly known  as: Oxy IR/ROXICODONE  Take 1 tablet (5 mg total) by mouth every 4 (four) hours as needed for moderate pain (pain score 4-6).   prenatal multivitamin Tabs tablet Take 1 tablet by mouth daily at 12 noon.   senna-docusate 8.6-50 MG tablet Commonly known as: Senokot-S Take 2 tablets by mouth 2 (two) times daily as needed for mild constipation.         Discharge home in stable condition Infant Feeding: Breast Infant Disposition:NICU Discharge instruction: per After Visit Summary and Postpartum booklet. Activity: Advance as tolerated. Pelvic rest for  6 weeks.  Diet: routine diet Future Appointments:No future appointments. Follow up Visit: Message sent to [facility] 1/6 by Dr. Nicholaus    Please schedule this patient for a In person postpartum visit in 6 weeks with the following provider: Any provider. Additional Postpartum F/U:Incision check 1 week and BP check 1 week  High risk pregnancy complicated by:  Guyann SALLIES, severe FGR Delivery mode:  C-Section, Low Transverse Anticipated Birth Control:  POPs   10/17/2023 Augustin JAYSON Slade, MD

## 2023-10-17 NOTE — Lactation Note (Signed)
 This note was copied from a baby's chart.  NICU Lactation Consultation Note  Patient Name: Melissa Fleming Date: 10/17/2023 Age:36 days  Reason for consult: Follow-up assessment; Primapara; 1st time breastfeeding; NICU baby; Late-preterm 34-36.6wks; Infant < 6lbs; Maternal endocrine disorder; Other (Comment) (CHTN) Type of Endocrine Disorder?: PCOS  SUBJECTIVE  LC in to visit with P1 Mom of baby Melissa Fleming in the NICU.  Baby has been going to the breast as baby is latching and staying on longer.  Mom reports a deeper latch now and she hears swallows.  LC will F/U to observe a breastfeeding later today as Mom is just waking up and baby is bottle feeding this feeding.  Mom did say she hadn't pumped when baby had breastfed.  Talked about the importance of continuing to pump due to baby being a LPTI and <5 lbs.   Mom denies engorgement.  Plan recommended- 1- STS with baby, watching for feeding cues 2- if baby breastfeeds, Mom will double pump 15-30 mins to support her milk supply 3-If baby is bottle fed, Mom will double pump 15-30 mins to support her milk supply 4- ask for assistance with breastfeeding prn   OBJECTIVE Infant data: No data recorded O2 Device: Room Air  Infant feeding assessment IDFTS - Readiness: 2 IDFTS - Quality: 3   Maternal data: [id number] C-Section, Low Transverse Pumping frequency: Mom encouraged to pump after baby breastfeeds AND if baby is supplemented with bottle Pumped volume: 30 mL Flange Size: 21 Hands-free pumping top sizes: Large Alejos)  Pump: Stork Pump  ASSESSMENT Infant:  Feeding Status: Scheduled 8-11-2-5 Feeding method: Bottle; Tube/Gavage (Bolus) Nipple Type: Dr. Jonna Fling Preemie  Maternal: Milk volume: Normal  INTERVENTIONS/PLAN Interventions: Interventions: Skin to skin; Breast massage; Hand express; DEBP; Education Discharge Education: Engorgement and breast care Tools: Pump; Flanges; Hands-free pumping top; Bottle Pump  Education: Setup, frequency, and cleaning  Plan: Consult Status: NICU follow-up NICU Follow-up type: Verify onset of copious milk; Verify absence of engorgement; Maternal D/C visit   Claudene Aleck BRAVO 10/17/2023, 8:41 AM

## 2023-10-19 ENCOUNTER — Telehealth: Payer: Self-pay

## 2023-10-19 NOTE — Telephone Encounter (Signed)
 Left message for patient to return the call.

## 2023-10-23 ENCOUNTER — Telehealth (HOSPITAL_COMMUNITY): Payer: Self-pay | Admitting: *Deleted

## 2023-10-23 ENCOUNTER — Ambulatory Visit (HOSPITAL_COMMUNITY): Payer: Self-pay

## 2023-10-23 NOTE — Telephone Encounter (Signed)
10/23/2023  Name: Melissa Fleming MRN: [id number] DOB: Aug 20, 1986  Reason for Call:  Transition of Care Hospital Discharge Call  Contact Status: Patient Contact Status: Message  Language assistant needed:          Follow-Up Questions:    [name] Postnatal Depression Scale:  In the Past 7 Days:    PHQ2-9 Depression Scale:     Discharge Follow-up:    Post-discharge interventions: NA  Salena Saner, RN 10/23/2023

## 2023-10-23 NOTE — Lactation Note (Signed)
This note was copied from a baby's chart.  NICU Lactation Consultation Note  Patient Name: Melissa Fleming [id number] Date: 10/23/2023 Age:36 days  Reason for consult: Follow-up assessment; Breastfeeding assistance; Early term 57-38.6wks; NICU baby; Infant < 6lbs; Primapara; 1st time breastfeeding Type of Endocrine Disorder?: PCOS GDM  SUBJECTIVE  Mom wanting to latch baby an hour before feeding time due to baby showing cues.  Baby sucking on his pacifier.  At 1:30pm, LC came in to assist Mom with latching baby to the breast. Mom describes not putting baby to the breast, as the feeding isn't counted and she would like for baby to be able to PO feed and be able to go home.    Mom admits to pumping at night, about 3 times for 30-60 ml, but not during the day.  Talked about the importance of consistent pumping while baby is gaining skills to transfer at the breast.  Weighted feed offered and Mom agreeable.  Baby positioned in football hold (after getting the pre-weight), LC added pillow support under baby and lots of teaching on the importance of baby latching deeply to the breast.   After Mom hand expressed a drop, LC assisted moving hands back away from the areola.  Mom made aware that baby "[name]" may latch to her nipple due to its ample size and compressibility.  Placing her hands back away from areola will give baby a better chance of a deeper latch.  LC watched as baby was able to attain a latch to the areola.  LC showed Mom how to flange upper lip and how LC tugged on chin to draw jaw down and flange lower lip.  Baby sucking with slow and deep jaw extensions initially for the first 7 mins.  He came off and burped and re-latched.with guidance from [facility] on hand placement.  LC educated Mom on alternate breast compression during sucking to aid in milk transfer.  Baby became more inconsistent and an occasional swallow was identified.  After a total of 15 mins on the breast, non-nutritive and  nutritive sucking, post weight was 6 ml.  Mom was disappointed.  Reassured her that baby loves the breast, appears to latch well, and now it is time for her to regain her milk supply.,  Mom is agreeable to step up her pumping.   Encouraged her to offer the breast at scheduled feeding times and always do a full pumping after breastfeeding.  Talked about the benefit of baby latching to her milk supply, but always pumping after.  OBJECTIVE Infant data: No data recorded O2 Device: Room Air  Infant feeding assessment IDFTS - Readiness: 1 IDFTS - Quality: 2   Maternal data: [id number] C-Section, Low Transverse Pumping frequency: 3 times at night only Pumped volume: 30 mL Flange Size: 21  Pump: Stork Pump  ASSESSMENT Infant: Latch: Grasps breast easily, tongue down, lips flanged, rhythmical sucking. Audible Swallowing: A few with stimulation Type of Nipple: Everted at rest and after stimulation Comfort (Breast/Nipple): Soft / non-tender Hold (Positioning): Assistance needed to correctly position infant at breast and maintain latch. (adjusting Mom's hands on the breast and encouraging compression during sucking) LATCH Score: 8  Feeding Status: Scheduled 8-11-2-5 Feeding method: Tube/Gavage (Bolus) Nipple Type: Dr. Levert Feinstein Preemie  Maternal: Milk volume: Low  INTERVENTIONS/PLAN Interventions: Interventions: Breast feeding basics reviewed; Assisted with latch; Skin to skin; Breast massage; Hand express; Breast compression; Adjust position; Support pillows; Position options; DEBP; Weighted feed; Education Tools: Flanges; Pump; Bottle Pump Education: Setup,  frequency, and cleaning; Milk Storage  Plan: 1- STS with baby, offering the breast with feeding cues 2- Mom to ask for help with latch prn, making sure baby is latched deeply 3- Mom to pump 15-30 min after each breastfeeding to support her milk supply  Consult Status: NICU follow-up NICU Follow-up type: Weekly NICU follow  up   [name] 10/23/2023, 2:24 PM

## 2023-10-25 ENCOUNTER — Ambulatory Visit (HOSPITAL_COMMUNITY): Payer: Self-pay

## 2023-10-25 NOTE — Lactation Note (Signed)
This note was copied from a baby's chart.  NICU Lactation Consultation Note  Patient Name: Melissa Fleming [id number] Date: 10/25/2023 Age:36 days  Reason for consult: Maternal endocrine disorder; Follow-up assessment; 1st time breastfeeding; Primapara; NICU baby; Early term 7-38.6wks; Infant < 6lbs Type of Endocrine Disorder?: PCOS  SUBJECTIVE  LC in to visit with P1 Mom of [name] in the NICU.  Baby is primarily bottle and gavage feeding, as Mom is anxious for baby to be able to come home. Mom states she has started pumping more frequently every 3 hrs during the day, but her supply has decreased.  Reassured her that it would take a few days for her body to respond to the increase in demand.    Mom states she will let baby have the breast if he is fussy prior to feeding times.  Mom states he will suckle for 5 mins at most.  Encouraged Mom to pump after he is STS or latches to maximize the hormone stimulation.  Mom hasn't told the nurses as she doesn't want "Melissa Fleming" to not be able to get the bottle.  Mom provided with 2 handouts on increasing milk supply and hands on pumping.  Mom appears discouraged.  Offered to do another weighted feed and assist tomorrow.  RN to check with Mom.  OBJECTIVE Infant data: No data recorded O2 Device: Room Air  Infant feeding assessment IDFTS - Readiness: 2 IDFTS - Quality: 3   Maternal data: [id number] C-Section, Low Transverse Pumping frequency: Mom reports she is pumping during the day, but getting less volume.  Mom encouraged to pump every 3 hrs Pumped volume: 20 mL Flange Size: 21 Hands-free pumping top sizes: Large Wallace Cullens)  Pump: Stork Pump  ASSESSMENT Infant:  Feeding Status: Scheduled 8-11-2-5 Feeding method: Bottle; Tube/Gavage (Bolus) Nipple Type: Dr. Levert Feinstein Preemie  Maternal: Milk volume: Low  INTERVENTIONS/PLAN Interventions: Interventions: Skin to skin; Breast massage; Hand express; DEBP; Education Tools: Pump;  Flanges; Bottle; Hands-free pumping top Pump Education: Setup, frequency, and cleaning; Milk Storage  Plan: Consult Status: NICU follow-up NICU Follow-up type: Verify onset of copious milk; Weekly NICU follow up   [name] 10/25/2023, 2:08 PM

## 2023-10-27 ENCOUNTER — Ambulatory Visit (HOSPITAL_COMMUNITY): Payer: Self-pay

## 2023-10-27 ENCOUNTER — Ambulatory Visit: Payer: Medicaid Other

## 2023-10-27 NOTE — Lactation Note (Addendum)
This note was copied from a baby's chart.  NICU Lactation Consultation Note  Patient Name: Melissa Fleming [id number] Date: 10/27/2023 Age:36 wk.o.  Reason for consult: NICU baby; Other (Comment); Maternal endocrine disorder; Weekly NICU follow-up; Primapara; 1st time breastfeeding; Early term 14-38.6wks; Infant < 6lbs (AMA, cHTN) Type of Endocrine Disorder?: PCOS; Diabetes (A2GDM (glyburide))  SUBJECTIVE Visited with family of 9 48/49 weeks old AGA NICU female for the 5 pm feeding but when this LC arrived in the room, Melissa Fleming was already giving [name] a bottle. She voiced she's been taking baby to breast occasionally and that he's been latching, praised her for her efforts. Noticed that pumping is still not consistent, she voiced she understands that she should be pumping more but felt discouraged about her supply "not being like in the beginning". Explained to her that after her calibration period (endocrine phase) her body goes into supply/demand (exocrine phase) and that sustaining supply during this phase relies mainly in prompt milk removal. She asked for another pumping band in size S/M for hands on pumping, it was provided. Reviewed strategies to increase supply; and let her know that even if Melissa Fleming doesn't latch any amount of STS care would still be beneficial for both.   OBJECTIVE Infant data: Mother's Current Feeding Choice: Breast Milk and Formula  O2 Device: Room Air  Infant feeding assessment IDFTS - Readiness: 1 IDFTS - Quality: 3   Maternal data: [id number] C-Section, Low Transverse Pumping frequency: 2-3 times/24 hours Pumped volume: 30 mL (30-60 ml) Flange Size: 21 Hands-free pumping top sizes: Small/Medium (Blue)  Pump: Stork Pump (Spectra S2)  ASSESSMENT Infant: Feeding Status: Scheduled 8-11-2-5 Feeding method: Bottle; Tube/Gavage (Bolus) Nipple Type: Dr. Levert Fleming Preemie  Maternal: Milk volume: Low  INTERVENTIONS/PLAN Interventions: Interventions:  Breast feeding basics reviewed; DEBP; Education; Coconut oil Tools: Hands-free pumping top; Pump; Flanges; Coconut oil Pump Education: Setup, frequency, and cleaning; Milk Storage  Plan: Encouraged pumping every 3 hours, ideally 8 pumping sessions/24 hours She'll give power pumping a try, she understands it will take at least a week to see the results from our interventions She'll continue taking [name] to breast on feeding cues, around feeding times and will call for assistance PRN.    No other support person at this time. All questions and concerns answered, family to contact [facility] services PRN.  Consult Status: NICU follow-up NICU Follow-up type: Weekly NICU follow up   Melissa Fleming 10/27/2023, 6:13 PM

## 2023-11-04 ENCOUNTER — Ambulatory Visit (HOSPITAL_COMMUNITY): Payer: Self-pay

## 2023-11-04 NOTE — Lactation Note (Signed)
This note was copied from a baby's chart.  NICU Lactation Consultation Note  Patient Name: Melissa Fleming [id number] Date: 11/04/2023 Age:36 wk.o.  Reason for consult: Weekly NICU follow-up; NICU baby; Maternal endocrine disorder; Primapara; 1st time breastfeeding; Term; Infant < 6lbs Type of Endocrine Disorder?: PCOS; Diabetes (A2GDM (glyburide))  SUBJECTIVE Visited with family of 36 2/30 weeks old AGA NICU female; Ms. Motter is a P1 and reports she hasn't been pumping consistently, the last time she pumped was two days ago and got 60 ml. However she has been putting [name] to breast, re-educated about supply/demand and let her know that Melissa Fleming will need to be supplemented with bottles after feedings/attempts at the breast since pumping and supply are not consistent. Ms. Pagliarulo is taking baby Melissa Fleming home today. Reviewed discharge education and the importance of consistent pumping to increase/protect her supply, she said she has two DEBPs at home (the [facility] office sent her one) and she wants to try breastfeeding once baby Melissa Fleming leaves the hospital, she's willing to try again. She politely declined a referral to [facility] OP as she'll be following up with the local [facility] office for lactation care. No other support person at this time. All questions and concerns answered, family to contact [facility] services PRN.  OBJECTIVE Infant data: O2 Device: Room Air  Infant feeding assessment IDFTS - Readiness: 1 IDFTS - Quality: 2   Maternal data: [id number] C-Section, Low Transverse Pumping frequency: Has not pumped the last 24 hours but she has taken baby to breast Pumped volume: 0 mL  Pump: Stork Pump (Spectra S2)  ASSESSMENT Infant: Feeding Status: Ad lib Feeding method: Bottle Nipple Type: Dr. Levert Feinstein Preemie  Maternal: Milk volume: Low  INTERVENTIONS/PLAN Interventions: Interventions: Breast feeding basics reviewed; DEBP; Education Discharge Education: Outpatient recommendation  Plan: Consult  Status: Complete   Junita Kubota Venetia Constable 11/04/2023, 10:47 AM

## 2023-11-06 ENCOUNTER — Ambulatory Visit: Payer: Medicaid Other | Admitting: Cardiology

## 2023-11-10 ENCOUNTER — Ambulatory Visit: Payer: Medicaid Other

## 2023-11-23 ENCOUNTER — Ambulatory Visit: Payer: Medicaid Other | Admitting: Obstetrics & Gynecology

## 2023-11-23 ENCOUNTER — Other Ambulatory Visit: Payer: Self-pay

## 2023-11-23 DIAGNOSIS — Z98891 History of uterine scar from previous surgery: Secondary | ICD-10-CM | POA: Diagnosis not present

## 2023-11-23 DIAGNOSIS — O10919 Unspecified pre-existing hypertension complicating pregnancy, unspecified trimester: Secondary | ICD-10-CM

## 2023-11-23 MED ORDER — SLYND 4 MG PO TABS: 1.0000 | ORAL_TABLET | Freq: Every day | ORAL | 11 refills | Status: AC

## 2023-11-23 NOTE — Progress Notes (Signed)
    Post Partum Visit Note  Melissa Fleming is a 38 y.o. [id number] female who presents for a postpartum visit. She is 6 weeks postpartum following a primary cesarean section.  I have fully reviewed the prenatal and intrapartum course. The delivery was at 36.3 gestational weeks.  Anesthesia: epidural. Postpartum course has been fine. Baby is doing well. Baby is feeding by both breast and bottle - Similac Neosure. Bleeding no bleeding. Bowel function is normal. Bladder function is normal. Patient is not sexually active. Contraception method is none. Patient is unsure about patch or nexplanon. Postpartum depression screening: negative.   The pregnancy intention screening data noted above was reviewed. Potential methods of contraception were discussed. The patient elected to proceed with No data recorded.    Health Maintenance Due  Topic Date Due   COVID-19 Vaccine (1 - 2024-25 season) Never done    The following portions of the patient's history were reviewed and updated as appropriate: allergies, current medications, past family history, past medical history, past social history, past surgical history, and problem list.  Review of Systems Pertinent items are noted in HPI.  Objective:  There were no vitals taken for this visit.   General:  alert, cooperative, and no distress   Breasts:  not indicated  Lungs:   Heart:  regular rate and rhythm  Abdomen: soft, non-tender; bowel sounds normal; no masses,  no organomegaly   Wound well approximated incision  GU exam:  not indicated       Assessment:    There are no diagnoses linked to this encounter.  6 week postpartum exam with HTN  Plan:   Essential components of care per ACOG recommendations:  1.  Mood and well being: Patient with negative depression screening today. Reviewed local resources for support.  - Patient tobacco use? No.   - hx of drug use? No.    2. Infant care and feeding:  -Patient currently breastmilk feeding? Yes.  Discussed returning to work and pumping.  -Social determinants of health (SDOH) reviewed in EPIC. No concerns  3. Sexuality, contraception and birth spacing - Patient does not want a pregnancy in the next year.  Desired family size is 2 children.  - Reviewed reproductive life planning. Reviewed contraceptive methods based on pt preferences and effectiveness.  Patient desired Oral Contraceptive today.   - Discussed birth spacing of 18 months  4. Sleep and fatigue -Encouraged family/partner/community support of 4 hrs of uninterrupted sleep to help with mood and fatigue  5. Physical Recovery  - Discussed patients delivery and complications. She describes her labor as mixed. - Patient had a C-section emergent. No laceration. Perineal healing reviewed. Patient expressed understanding - Patient has urinary incontinence? No. - Patient is safe to resume physical and sexual activity  6.  Health Maintenance - HM due items addressed Yes - Last pap smear  Diagnosis  Date Value Ref Range Status  04/30/2023   Final   - Negative for intraepithelial lesion or malignancy (NILM)   Pap smear not done at today's visit.  -Breast Cancer screening indicated? No.   7. Chronic Disease/Pregnancy Condition follow up: Hypertension  - PCP follow up  Adam Phenix, MD  Center for [facility], [facility]

## 2023-12-15 ENCOUNTER — Ambulatory Visit: Payer: Self-pay | Admitting: Family

## 2024-01-11 ENCOUNTER — Ambulatory Visit: Admitting: Advanced Practice Midwife

## 2024-07-21 ENCOUNTER — Other Ambulatory Visit: Payer: Self-pay | Admitting: Medical Genetics

## 2024-07-21 DIAGNOSIS — Z006 Encounter for examination for normal comparison and control in clinical research program: Secondary | ICD-10-CM

## 2024-12-05 ENCOUNTER — Ambulatory Visit: Payer: Self-pay | Admitting: Certified Nurse Midwife

## 2024-12-08 ENCOUNTER — Ambulatory Visit: Admitting: Obstetrics & Gynecology
